# Patient Record
Sex: Male | Born: 1935 | Race: White | Hispanic: No | Marital: Married | Smoking: Former smoker
Health system: Southern US, Community
[De-identification: ages and names within clinical notes are randomized; demographics above are authoritative.]

## PROBLEM LIST (undated history)

## (undated) DIAGNOSIS — I509 Heart failure, unspecified: Secondary | ICD-10-CM

## (undated) DIAGNOSIS — E669 Obesity, unspecified: Secondary | ICD-10-CM

## (undated) DIAGNOSIS — I219 Acute myocardial infarction, unspecified: Secondary | ICD-10-CM

## (undated) DIAGNOSIS — I639 Cerebral infarction, unspecified: Secondary | ICD-10-CM

## (undated) DIAGNOSIS — E039 Hypothyroidism, unspecified: Secondary | ICD-10-CM

## (undated) DIAGNOSIS — N529 Male erectile dysfunction, unspecified: Secondary | ICD-10-CM

## (undated) DIAGNOSIS — Z8619 Personal history of other infectious and parasitic diseases: Secondary | ICD-10-CM

## (undated) DIAGNOSIS — I4891 Unspecified atrial fibrillation: Secondary | ICD-10-CM

## (undated) DIAGNOSIS — N400 Enlarged prostate without lower urinary tract symptoms: Secondary | ICD-10-CM

## (undated) DIAGNOSIS — R600 Localized edema: Secondary | ICD-10-CM

## (undated) DIAGNOSIS — E7800 Pure hypercholesterolemia, unspecified: Secondary | ICD-10-CM

## (undated) DIAGNOSIS — E079 Disorder of thyroid, unspecified: Secondary | ICD-10-CM

## (undated) DIAGNOSIS — I2510 Atherosclerotic heart disease of native coronary artery without angina pectoris: Secondary | ICD-10-CM

## (undated) DIAGNOSIS — M109 Gout, unspecified: Secondary | ICD-10-CM

## (undated) DIAGNOSIS — I499 Cardiac arrhythmia, unspecified: Secondary | ICD-10-CM

## (undated) DIAGNOSIS — K7460 Unspecified cirrhosis of liver: Secondary | ICD-10-CM

## (undated) HISTORY — PX: CARDIAC SURGERY: SHX584

## (undated) HISTORY — PX: VEIN BYPASS SURGERY: SHX833

## (undated) HISTORY — PX: VASCULAR SURGERY: SHX849

## (undated) HISTORY — PX: CORONARY ARTERY BYPASS GRAFT: SHX141

---

## 2005-08-15 ENCOUNTER — Ambulatory Visit: Payer: Self-pay | Admitting: Nephrology

## 2005-10-27 ENCOUNTER — Other Ambulatory Visit: Payer: Self-pay

## 2005-10-27 ENCOUNTER — Inpatient Hospital Stay: Payer: Self-pay | Admitting: Internal Medicine

## 2005-12-06 ENCOUNTER — Encounter: Payer: Self-pay | Admitting: Cardiology

## 2006-01-06 ENCOUNTER — Encounter: Payer: Self-pay | Admitting: Cardiology

## 2006-02-05 ENCOUNTER — Encounter: Payer: Self-pay | Admitting: Cardiology

## 2006-03-16 ENCOUNTER — Ambulatory Visit: Payer: Self-pay | Admitting: Cardiology

## 2006-05-06 ENCOUNTER — Other Ambulatory Visit: Payer: Self-pay

## 2006-05-06 ENCOUNTER — Inpatient Hospital Stay: Payer: Self-pay | Admitting: General Surgery

## 2006-06-01 ENCOUNTER — Other Ambulatory Visit: Payer: Self-pay

## 2006-06-01 ENCOUNTER — Emergency Department: Payer: Self-pay | Admitting: Internal Medicine

## 2006-06-20 ENCOUNTER — Ambulatory Visit: Payer: Self-pay | Admitting: Gastroenterology

## 2006-07-17 ENCOUNTER — Ambulatory Visit: Payer: Self-pay | Admitting: Gastroenterology

## 2007-03-12 ENCOUNTER — Ambulatory Visit: Payer: Self-pay | Admitting: Internal Medicine

## 2007-11-04 ENCOUNTER — Ambulatory Visit: Payer: Self-pay | Admitting: Nephrology

## 2011-04-19 ENCOUNTER — Encounter: Payer: Self-pay | Admitting: Nurse Practitioner

## 2011-04-19 ENCOUNTER — Encounter: Payer: Self-pay | Admitting: Cardiothoracic Surgery

## 2011-05-09 ENCOUNTER — Encounter: Payer: Self-pay | Admitting: Nurse Practitioner

## 2011-05-09 ENCOUNTER — Encounter: Payer: Self-pay | Admitting: Cardiothoracic Surgery

## 2011-06-09 ENCOUNTER — Encounter: Payer: Self-pay | Admitting: Nurse Practitioner

## 2011-06-09 ENCOUNTER — Encounter: Payer: Self-pay | Admitting: Cardiothoracic Surgery

## 2011-07-07 ENCOUNTER — Encounter: Payer: Self-pay | Admitting: Nurse Practitioner

## 2011-07-07 ENCOUNTER — Encounter: Payer: Self-pay | Admitting: Cardiothoracic Surgery

## 2011-07-11 ENCOUNTER — Other Ambulatory Visit: Payer: Self-pay | Admitting: Nurse Practitioner

## 2011-07-11 LAB — CBC WITH DIFFERENTIAL/PLATELET
Basophil %: 0.4 %
Eosinophil %: 2.7 %
HCT: 43.2 % (ref 40.0–52.0)
HGB: 14.3 g/dL (ref 13.0–18.0)
Lymphocyte #: 1.5 10*3/uL (ref 1.0–3.6)
Lymphocyte %: 29.8 %
MCHC: 33 g/dL (ref 32.0–36.0)
MCV: 103 fL — ABNORMAL HIGH (ref 80–100)
Monocyte %: 14.8 %
Neutrophil #: 2.6 10*3/uL (ref 1.4–6.5)
Platelet: 87 10*3/uL — ABNORMAL LOW (ref 150–440)
RBC: 4.19 10*6/uL — ABNORMAL LOW (ref 4.40–5.90)
RDW: 16.5 % — ABNORMAL HIGH (ref 11.5–14.5)
WBC: 4.9 10*3/uL (ref 3.8–10.6)

## 2011-07-11 LAB — ALBUMIN: Albumin: 3.9 g/dL (ref 3.4–5.0)

## 2011-07-11 LAB — SEDIMENTATION RATE: Erythrocyte Sed Rate: 14 mm/hr (ref 0–20)

## 2011-07-15 LAB — WOUND CULTURE

## 2011-07-25 ENCOUNTER — Ambulatory Visit: Payer: Self-pay | Admitting: Internal Medicine

## 2011-07-25 LAB — APTT: Activated PTT: 41.5 secs — ABNORMAL HIGH (ref 23.6–35.9)

## 2011-07-25 LAB — CBC CANCER CENTER
Eosinophil #: 0.1 x10 3/mm (ref 0.0–0.7)
Eosinophil %: 1.8 %
Eosinophil: 3 %
HCT: 44.7 % (ref 40.0–52.0)
HGB: 15 g/dL (ref 13.0–18.0)
Lymphocyte #: 1.3 x10 3/mm (ref 1.0–3.6)
MCHC: 33.4 g/dL (ref 32.0–36.0)
MCV: 102 fL — ABNORMAL HIGH (ref 80–100)
Monocyte #: 0.6 x10 3/mm (ref 0.0–0.7)
Monocyte %: 9.9 %
Monocytes: 8 %
Neutrophil #: 3.9 x10 3/mm (ref 1.4–6.5)
Neutrophil %: 65.8 %
RBC: 4.38 10*6/uL — ABNORMAL LOW (ref 4.40–5.90)
Segmented Neutrophils: 69 %
WBC: 6 x10 3/mm (ref 3.8–10.6)

## 2011-07-25 LAB — RETICULOCYTES
Absolute Retic Count: 0.085 10*6/uL — ABNORMAL HIGH (ref 0.024–0.084)
Reticulocyte: 2 % — ABNORMAL HIGH (ref 0.5–1.5)

## 2011-07-25 LAB — FIBRINOGEN: Fibrinogen: 447 mg/dL (ref 210–470)

## 2011-07-25 LAB — PROTIME-INR
INR: 1.6
Prothrombin Time: 19.5 secs — ABNORMAL HIGH (ref 11.5–14.7)

## 2011-07-25 LAB — IRON AND TIBC
Iron Bind.Cap.(Total): 418 ug/dL (ref 250–450)
Iron Saturation: 19 %

## 2011-07-25 LAB — FIBRIN DEGRADATION PROD.(ARMC ONLY): Fibrin Degradation Prod.: 0 — ABNORMAL LOW (ref 2.1–7.7)

## 2011-07-25 LAB — LACTATE DEHYDROGENASE: LDH: 137 U/L (ref 87–241)

## 2011-07-26 ENCOUNTER — Ambulatory Visit: Payer: Self-pay | Admitting: Internal Medicine

## 2011-07-26 LAB — PROT IMMUNOELECTROPHORES(ARMC)

## 2011-08-07 ENCOUNTER — Encounter: Payer: Self-pay | Admitting: Nurse Practitioner

## 2011-08-07 ENCOUNTER — Ambulatory Visit: Payer: Self-pay | Admitting: Internal Medicine

## 2011-08-07 ENCOUNTER — Encounter: Payer: Self-pay | Admitting: Cardiothoracic Surgery

## 2011-09-06 ENCOUNTER — Ambulatory Visit: Payer: Self-pay | Admitting: Internal Medicine

## 2011-09-06 ENCOUNTER — Encounter: Payer: Self-pay | Admitting: Cardiothoracic Surgery

## 2011-09-06 ENCOUNTER — Encounter: Payer: Self-pay | Admitting: Nurse Practitioner

## 2011-10-07 ENCOUNTER — Encounter: Payer: Self-pay | Admitting: Cardiothoracic Surgery

## 2011-10-07 ENCOUNTER — Encounter: Payer: Self-pay | Admitting: Nurse Practitioner

## 2011-11-06 ENCOUNTER — Encounter: Payer: Self-pay | Admitting: Nurse Practitioner

## 2011-11-06 ENCOUNTER — Encounter: Payer: Self-pay | Admitting: Cardiothoracic Surgery

## 2011-12-07 ENCOUNTER — Encounter: Payer: Self-pay | Admitting: Cardiothoracic Surgery

## 2011-12-07 ENCOUNTER — Encounter: Payer: Self-pay | Admitting: Nurse Practitioner

## 2012-01-22 ENCOUNTER — Ambulatory Visit: Payer: Self-pay | Admitting: Internal Medicine

## 2012-01-22 LAB — CBC CANCER CENTER
HCT: 44.7 % (ref 40.0–52.0)
Lymphocyte #: 1.6 x10 3/mm (ref 1.0–3.6)
MCH: 33.6 pg (ref 26.0–34.0)
MCHC: 32.5 g/dL (ref 32.0–36.0)
MCV: 103 fL — ABNORMAL HIGH (ref 80–100)
Monocyte #: 0.6 x10 3/mm (ref 0.2–1.0)
Monocyte %: 11.6 %
Neutrophil #: 3 x10 3/mm (ref 1.4–6.5)
Neutrophil %: 55.2 %
Platelet: 101 x10 3/mm — ABNORMAL LOW (ref 150–440)
RDW: 16 % — ABNORMAL HIGH (ref 11.5–14.5)
WBC: 5.4 x10 3/mm (ref 3.8–10.6)

## 2012-01-22 LAB — CREATININE, SERUM
Creatinine: 2.92 mg/dL — ABNORMAL HIGH (ref 0.60–1.30)
EGFR (African American): 23 — ABNORMAL LOW

## 2012-01-22 LAB — RETICULOCYTES
Absolute Retic Count: 0.0604 10*6/uL (ref 0.031–0.129)
Reticulocyte: 1.4 % (ref 0.7–2.5)

## 2012-02-06 ENCOUNTER — Ambulatory Visit: Payer: Self-pay | Admitting: Internal Medicine

## 2013-01-20 ENCOUNTER — Ambulatory Visit: Payer: Self-pay | Admitting: Internal Medicine

## 2013-02-05 ENCOUNTER — Ambulatory Visit: Payer: Self-pay | Admitting: Internal Medicine

## 2014-07-10 ENCOUNTER — Emergency Department: Payer: Self-pay | Admitting: Emergency Medicine

## 2014-07-23 ENCOUNTER — Ambulatory Visit: Payer: Self-pay | Admitting: Cardiology

## 2015-01-10 ENCOUNTER — Encounter: Payer: Self-pay | Admitting: Medical Oncology

## 2015-01-10 ENCOUNTER — Inpatient Hospital Stay
Admission: EM | Admit: 2015-01-10 | Discharge: 2015-01-12 | DRG: 372 | Disposition: A | Discharge status: Home / Self Care | Payer: Medicare Other | Attending: Internal Medicine | Admitting: Internal Medicine

## 2015-01-10 ENCOUNTER — Inpatient Hospital Stay: Payer: Medicare Other

## 2015-01-10 DIAGNOSIS — I482 Chronic atrial fibrillation: Secondary | ICD-10-CM | POA: Diagnosis present

## 2015-01-10 DIAGNOSIS — I214 Non-ST elevation (NSTEMI) myocardial infarction: Secondary | ICD-10-CM

## 2015-01-10 DIAGNOSIS — E860 Dehydration: Secondary | ICD-10-CM | POA: Diagnosis present

## 2015-01-10 DIAGNOSIS — Z87891 Personal history of nicotine dependence: Secondary | ICD-10-CM

## 2015-01-10 DIAGNOSIS — R197 Diarrhea, unspecified: Secondary | ICD-10-CM

## 2015-01-10 DIAGNOSIS — I509 Heart failure, unspecified: Secondary | ICD-10-CM | POA: Diagnosis present

## 2015-01-10 DIAGNOSIS — Z9889 Other specified postprocedural states: Secondary | ICD-10-CM | POA: Diagnosis not present

## 2015-01-10 DIAGNOSIS — E780 Pure hypercholesterolemia: Secondary | ICD-10-CM | POA: Diagnosis not present

## 2015-01-10 DIAGNOSIS — N179 Acute kidney failure, unspecified: Secondary | ICD-10-CM | POA: Diagnosis not present

## 2015-01-10 DIAGNOSIS — I2510 Atherosclerotic heart disease of native coronary artery without angina pectoris: Secondary | ICD-10-CM | POA: Diagnosis not present

## 2015-01-10 DIAGNOSIS — R748 Abnormal levels of other serum enzymes: Secondary | ICD-10-CM | POA: Diagnosis present

## 2015-01-10 DIAGNOSIS — J312 Chronic pharyngitis: Secondary | ICD-10-CM | POA: Diagnosis present

## 2015-01-10 DIAGNOSIS — E785 Hyperlipidemia, unspecified: Secondary | ICD-10-CM | POA: Diagnosis present

## 2015-01-10 DIAGNOSIS — Z7901 Long term (current) use of anticoagulants: Secondary | ICD-10-CM

## 2015-01-10 DIAGNOSIS — E876 Hypokalemia: Secondary | ICD-10-CM | POA: Diagnosis present

## 2015-01-10 DIAGNOSIS — R109 Unspecified abdominal pain: Secondary | ICD-10-CM

## 2015-01-10 DIAGNOSIS — Z951 Presence of aortocoronary bypass graft: Secondary | ICD-10-CM

## 2015-01-10 DIAGNOSIS — A047 Enterocolitis due to Clostridium difficile: Principal | ICD-10-CM | POA: Diagnosis present

## 2015-01-10 DIAGNOSIS — N189 Chronic kidney disease, unspecified: Secondary | ICD-10-CM

## 2015-01-10 DIAGNOSIS — N183 Chronic kidney disease, stage 3 (moderate): Secondary | ICD-10-CM | POA: Diagnosis not present

## 2015-01-10 DIAGNOSIS — R5381 Other malaise: Secondary | ICD-10-CM

## 2015-01-10 HISTORY — DX: Benign prostatic hyperplasia without lower urinary tract symptoms: N40.0

## 2015-01-10 HISTORY — DX: Unspecified atrial fibrillation: I48.91

## 2015-01-10 HISTORY — DX: Pure hypercholesterolemia, unspecified: E78.00

## 2015-01-10 HISTORY — DX: Disorder of thyroid, unspecified: E07.9

## 2015-01-10 HISTORY — DX: Heart failure, unspecified: I50.9

## 2015-01-10 LAB — BASIC METABOLIC PANEL
ANION GAP: 10 (ref 5–15)
BUN: 62 mg/dL — ABNORMAL HIGH (ref 6–20)
CHLORIDE: 99 mmol/L — AB (ref 101–111)
CO2: 25 mmol/L (ref 22–32)
Calcium: 8.6 mg/dL — ABNORMAL LOW (ref 8.9–10.3)
Creatinine, Ser: 2.99 mg/dL — ABNORMAL HIGH (ref 0.61–1.24)
GFR calc non Af Amer: 18 mL/min — ABNORMAL LOW (ref >60)
GFR, EST AFRICAN AMERICAN: 21 mL/min — AB (ref >60)
GLUCOSE: 98 mg/dL (ref 65–99)
POTASSIUM: 3 mmol/L — AB (ref 3.5–5.1)
Sodium: 134 mmol/L — ABNORMAL LOW (ref 135–145)

## 2015-01-10 LAB — HEPATIC FUNCTION PANEL
ALBUMIN: 2.3 g/dL — AB (ref 3.5–5.0)
ALT: 31 U/L (ref 17–63)
AST: 53 U/L — AB (ref 15–41)
Alkaline Phosphatase: 83 U/L (ref 38–126)
BILIRUBIN DIRECT: 0.4 mg/dL (ref 0.1–0.5)
BILIRUBIN TOTAL: 1.3 mg/dL — AB (ref 0.3–1.2)
Indirect Bilirubin: 0.9 mg/dL (ref 0.3–0.9)
Total Protein: 5.4 g/dL — ABNORMAL LOW (ref 6.5–8.1)

## 2015-01-10 LAB — CBC
HEMATOCRIT: 37 % — AB (ref 40.0–52.0)
HEMOGLOBIN: 12.5 g/dL — AB (ref 13.0–18.0)
MCH: 34.7 pg — AB (ref 26.0–34.0)
MCHC: 33.9 g/dL (ref 32.0–36.0)
MCV: 102.4 fL — ABNORMAL HIGH (ref 80.0–100.0)
Platelets: 171 10*3/uL (ref 150–440)
RBC: 3.62 MIL/uL — AB (ref 4.40–5.90)
RDW: 16.4 % — ABNORMAL HIGH (ref 11.5–14.5)
WBC: 13.6 10*3/uL — ABNORMAL HIGH (ref 3.8–10.6)

## 2015-01-10 LAB — URINALYSIS COMPLETE WITH MICROSCOPIC (ARMC ONLY)
BACTERIA UA: NONE SEEN
BILIRUBIN URINE: NEGATIVE
Glucose, UA: NEGATIVE mg/dL
HGB URINE DIPSTICK: NEGATIVE
KETONES UR: NEGATIVE mg/dL
Leukocytes, UA: NEGATIVE
Nitrite: NEGATIVE
PH: 5 (ref 5.0–8.0)
PROTEIN: NEGATIVE mg/dL
Specific Gravity, Urine: 1.017 (ref 1.005–1.030)

## 2015-01-10 LAB — TROPONIN I
TROPONIN I: 0.29 ng/mL — AB (ref <0.031)
Troponin I: 0.03 ng/mL (ref <0.031)

## 2015-01-10 LAB — PROTIME-INR
INR: 4.1
Prothrombin Time: 39.7 seconds — ABNORMAL HIGH (ref 11.4–15.0)

## 2015-01-10 LAB — LIPASE, BLOOD: LIPASE: 16 U/L — AB (ref 22–51)

## 2015-01-10 LAB — APTT: APTT: 61 s — AB (ref 24–36)

## 2015-01-10 LAB — GLUCOSE, CAPILLARY: GLUCOSE-CAPILLARY: 99 mg/dL (ref 65–99)

## 2015-01-10 MED ORDER — SIMVASTATIN 40 MG PO TABS
40.0000 mg | ORAL_TABLET | Freq: Every day | ORAL | Status: DC
  Administered 2015-01-10 – 2015-01-11 (×2): 40 mg via ORAL
  Filled 2015-01-10 (×2): qty 1

## 2015-01-10 MED ORDER — MORPHINE SULFATE (PF) 2 MG/ML IV SOLN: 2.0000 mg | INTRAVENOUS | Status: DC | PRN

## 2015-01-10 MED ORDER — HEPARIN SODIUM (PORCINE) 5000 UNIT/ML IJ SOLN: 5000.0000 [IU] | Freq: Three times a day (TID) | INTRAMUSCULAR | Status: DC

## 2015-01-10 MED ORDER — FUROSEMIDE 40 MG PO TABS
40.0000 mg | ORAL_TABLET | Freq: Every day | ORAL | Status: DC
  Administered 2015-01-11 – 2015-01-12 (×2): 40 mg via ORAL
  Filled 2015-01-10 (×2): qty 1

## 2015-01-10 MED ORDER — SODIUM CHLORIDE 0.9 % IJ SOLN
3.0000 mL | Freq: Two times a day (BID) | INTRAMUSCULAR | Status: DC
  Administered 2015-01-10 – 2015-01-11 (×2): 3 mL via INTRAVENOUS

## 2015-01-10 MED ORDER — POTASSIUM CHLORIDE IN NACL 40-0.9 MEQ/L-% IV SOLN
INTRAVENOUS | Status: DC
  Administered 2015-01-10 – 2015-01-11 (×2): 100 mL/h via INTRAVENOUS
  Filled 2015-01-10 (×7): qty 1000

## 2015-01-10 MED ORDER — LEVOTHYROXINE SODIUM 75 MCG PO TABS
150.0000 ug | ORAL_TABLET | Freq: Every day | ORAL | Status: DC
  Administered 2015-01-11 – 2015-01-12 (×2): 150 ug via ORAL
  Filled 2015-01-10 (×2): qty 2

## 2015-01-10 MED ORDER — ACETAMINOPHEN 325 MG PO TABS: 650.0000 mg | ORAL_TABLET | Freq: Four times a day (QID) | ORAL | Status: DC | PRN

## 2015-01-10 MED ORDER — ALLOPURINOL 300 MG PO TABS
150.0000 mg | ORAL_TABLET | Freq: Every day | ORAL | Status: DC
  Administered 2015-01-11 – 2015-01-12 (×2): 150 mg via ORAL
  Filled 2015-01-10 (×2): qty 1

## 2015-01-10 MED ORDER — ONDANSETRON HCL 4 MG PO TABS: 4.0000 mg | ORAL_TABLET | Freq: Four times a day (QID) | ORAL | Status: DC | PRN

## 2015-01-10 MED ORDER — CIPROFLOXACIN HCL 500 MG PO TABS
500.0000 mg | ORAL_TABLET | ORAL | Status: DC
  Administered 2015-01-11 (×2): 500 mg via ORAL
  Filled 2015-01-10 (×2): qty 1

## 2015-01-10 MED ORDER — METRONIDAZOLE 500 MG PO TABS
500.0000 mg | ORAL_TABLET | Freq: Three times a day (TID) | ORAL | Status: DC
  Administered 2015-01-11 – 2015-01-12 (×5): 500 mg via ORAL
  Filled 2015-01-10 (×5): qty 1

## 2015-01-10 MED ORDER — ASPIRIN EC 81 MG PO TBEC
81.0000 mg | DELAYED_RELEASE_TABLET | Freq: Every day | ORAL | Status: DC
  Administered 2015-01-11 – 2015-01-12 (×2): 81 mg via ORAL
  Filled 2015-01-10 (×2): qty 1

## 2015-01-10 MED ORDER — VITAMIN D 1000 UNITS PO TABS
1000.0000 [IU] | ORAL_TABLET | Freq: Every day | ORAL | Status: DC
  Administered 2015-01-11 – 2015-01-12 (×2): 1000 [IU] via ORAL
  Filled 2015-01-10 (×2): qty 1

## 2015-01-10 MED ORDER — POTASSIUM CHLORIDE CRYS ER 20 MEQ PO TBCR
40.0000 meq | EXTENDED_RELEASE_TABLET | Freq: Once | ORAL | Status: AC
  Administered 2015-01-10: 40 meq via ORAL
  Filled 2015-01-10: qty 2

## 2015-01-10 MED ORDER — PNEUMOCOCCAL VAC POLYVALENT 25 MCG/0.5ML IJ INJ
0.5000 mL | INJECTION | INTRAMUSCULAR | Status: AC
  Administered 2015-01-12: 0.5 mL via INTRAMUSCULAR
  Filled 2015-01-10: qty 0.5

## 2015-01-10 MED ORDER — AMIODARONE HCL 200 MG PO TABS
200.0000 mg | ORAL_TABLET | Freq: Every day | ORAL | Status: DC
  Administered 2015-01-11 – 2015-01-12 (×2): 200 mg via ORAL
  Filled 2015-01-10 (×2): qty 1

## 2015-01-10 MED ORDER — ONDANSETRON HCL 4 MG/2ML IJ SOLN: 4.0000 mg | Freq: Four times a day (QID) | INTRAMUSCULAR | Status: DC | PRN

## 2015-01-10 MED ORDER — INFLUENZA VAC SPLIT QUAD 0.5 ML IM SUSY
0.5000 mL | PREFILLED_SYRINGE | INTRAMUSCULAR | Status: AC
  Administered 2015-01-12: 0.5 mL via INTRAMUSCULAR
  Filled 2015-01-10: qty 0.5

## 2015-01-10 MED ORDER — ACETAMINOPHEN 650 MG RE SUPP: 650.0000 mg | Freq: Four times a day (QID) | RECTAL | Status: DC | PRN

## 2015-01-10 MED ORDER — POTASSIUM CHLORIDE 10 MEQ/100ML IV SOLN
10.0000 meq | INTRAVENOUS | Status: DC
  Administered 2015-01-10: 10 meq via INTRAVENOUS
  Filled 2015-01-10 (×6): qty 100

## 2015-01-10 NOTE — H&P (Signed)
History and Physical    Blake White ZOX:[id number] DOB: 1934/03/13 DOA: 01/10/2015  Referring physician: Dr. Fanny Bien PCP: Sula Rumple, MD  Specialists: Dr. Lady Gary  Chief Complaint: abdominal pain with diarrhea  HPI: Blake White is a 81 y.o. male has a past medical history significant for ASCVD s/p CABG and chronic A-fib on coumadin now with 3-4 day hx of lower abdominal discomfort and diarrhea. Feels extremely weak and fatigued. Denies CP or SOB. No N/V. In ER, pt noted to be dehydrated with hypokalemia with elevated troponin. Also with ARF due to dehydration. He is now admitted for further evaluation.  Review of Systems: The patient denies anorexia, fever, weight loss,, vision loss, decreased hearing, hoarseness, chest pain, syncope, dyspnea on exertion, peripheral edema, balance deficits, hemoptysis, melena, hematochezia, severe indigestion/heartburn, hematuria, incontinence, genital sores, muscle weakness, suspicious skin lesions, transient blindness, difficulty walking, depression, unusual weight change, abnormal bleeding, enlarged lymph nodes, angioedema, and breast masses.   Past Medical History  Diagnosis Date  . CHF (congestive heart failure)   . BPH (benign prostatic hyperplasia)   . A-fib   . Thyroid disease   . High cholesterol    Past Surgical History  Procedure Laterality Date  . Vein bypass surgery     Social History:  reports that he has quit smoking. He does not have any smokeless tobacco history on file. He reports that he does not drink alcohol. His drug history is not on file.  No Known Allergies  History reviewed. No pertinent family history.  Prior to Admission medications   Not on File   Physical Exam: Filed Vitals:   01/10/15 1749 01/10/15 1938 01/10/15 2000  BP: 104/50  103/59  Pulse: 64 61 62  Temp: 98.2 F (36.8 C)    TempSrc: Oral    Resp: Height:  (1.727 m)    Weight: 102.967 kg (227 lb)    SpO2: 99% 99% 99%      General:  No apparent distress  Eyes: PERRL, EOMI, no scleral icterus  ENT: moist oropharynx  Neck: supple, no lymphadenopathy  Cardiovascular: irregularly irregular without MRG; 2+ peripheral pulses, no JVD, 1+ peripheral edema  Respiratory: CTA biL, good air movement without wheezing, rhonchi or crackled  Abdomen: soft, mildly tender to palpation in the lower quadrants, positive bowel sounds, no guarding, no rebound  Skin: no rashes  Musculoskeletal: normal bulk and tone, no joint swelling  Psychiatric: normal mood and affect  Neurologic: CN 2-12 grossly intact, MS 5/5 in all 4  Labs on Admission:  Basic Metabolic Panel:  Recent Labs Lab 01/10/15 1834  NA 134*  K 3.0*  CL 99*  CO2 25  GLUCOSE 98  BUN 62*  CREATININE 2.99*  CALCIUM 8.6*   Liver Function Tests:  Recent Labs Lab 01/10/15 1834  AST 53*  ALT 31  ALKPHOS 83  BILITOT 1.3*  PROT 5.4*  ALBUMIN 2.3*    Recent Labs Lab 01/10/15 1834  LIPASE 16*   No results for input(s): AMMONIA in the last 168 hours. CBC:  Recent Labs Lab 01/10/15 1834  WBC 13.6*  HGB 12.5*  HCT 37.0*  MCV 102.4*  PLT 171   Cardiac Enzymes:  Recent Labs Lab 01/10/15 1834  TROPONINI 0.29*    BNP (last 3 results) No results for input(s): BNP in the last 8760 hours.  ProBNP (last 3 results) No results for input(s): PROBNP in the last 8760 hours.  CBG:  Recent Labs Lab 01/10/15  1826  GLUCAP 99    Radiological Exams on Admission: No results found.  EKG: Independently reviewed.  Assessment/Plan Active Problems:   Abdominal pain   Diarrhea   Will admit to floor with telemetry and follow enzymes. Send off stool studies and US abdomen. Begin empiric ABX. Consult GI and Cardiology. Begin IV fluids with K+. Repeat labs in AM.  Diet: clear liq Fluids: NS with K+ DVT Prophylaxis: SQ Heparin  Code Status: FULL  Family Communication: yes  Disposition Plan: home  Time spent: 50 min

## 2015-01-10 NOTE — ED Provider Notes (Signed)
[facility] Emergency Department Provider Note  ____________________________________________  Time seen: Approximately 7:34 PM  I have reviewed the triage vital signs and the nursing notes.   HISTORY  Chief Complaint Diarrhea and Fatigue    HPI Blake White is a 81 y.o. male with an extensive history of coronary artery disease status post CABG who takes warfarin who presents with several days of loose stools and general fatigue/malaise which as been getting worse.  He states that he is only having one or possibly 2 episodes of "diarrhea" daily, but they are quite loose.  He has tried taking Imodium but it does not help.  He has also had decreased appetite for the last several days and has not been eating well.  He feels generally weak all over.  He denies chest pain, shortness of breath.  He sometimes has some cramping lower abdominal pain but not currently.  He denies fever/chills.  He describes the diarrhea as severe even though it is typically only one episode per day.  Mostly he just feels generally ill and weak.     Past Medical History  Diagnosis Date  . CHF (congestive heart failure)   . BPH (benign prostatic hyperplasia)   . A-fib   . Thyroid disease   . High cholesterol     Patient Active Problem List   Diagnosis Date Noted  . Abdominal pain 01/10/2015  . Diarrhea 01/10/2015    Past Surgical History  Procedure Laterality Date  . Vein bypass surgery      No current outpatient prescriptions on file.  Allergies Review of patient's allergies indicates no known allergies.  History reviewed. No pertinent family history.  Social History Social History  Substance Use Topics  . Smoking status: Former Games developer  . Smokeless tobacco: None  . Alcohol Use: No    Review of Systems Constitutional: No fever/chills Eyes: No visual changes. ENT: No sore throat. Cardiovascular: Denies chest pain. Respiratory: Denies shortness of  breath. Gastrointestinal: Intermittent lower abdominal cramping.  No nausea, no vomiting.  One to 2 episodes of loose stool a day for several days.  No constipation. Genitourinary: Negative for dysuria. Musculoskeletal: Negative for back pain. Skin: Negative for rash. Neurological: Negative for headaches, focal weakness or numbness.  10-point ROS otherwise negative.  ____________________________________________   PHYSICAL EXAM:  VITAL SIGNS: ED Triage Vitals  Enc Vitals Group     BP 01/10/15 1749 104/50 mmHg     Pulse Rate 01/10/15 1749 64     Resp 01/10/15 1749 18     Temp 01/10/15 1749 98.2 F (36.8 C)     Temp Source 01/10/15 1749 Oral     SpO2 01/10/15 1749 99 %     Weight 01/10/15 1749 227 lb (102.967 kg)     Height 01/10/15 1749 5\' 8"  (1.727 m)     Head Cir --      Peak Flow --      Pain Score 01/10/15 1749 0     Pain Loc --      Pain Edu? --      Excl. in GC? --     Constitutional: Alert and oriented.  Appears ill but nontoxic Eyes: Conjunctivae are normal. PERRL. EOMI. Head: Atraumatic. Nose: No congestion/rhinnorhea. Mouth/Throat: Mucous membranes are moist.  Oropharynx non-erythematous. Neck: No stridor.   Cardiovascular: Borderline bradycardia, regular rhythm. Grossly normal heart sounds.  Good peripheral circulation. Respiratory: Normal respiratory effort.  No retractions. Lungs CTAB. Gastrointestinal: Soft and nontender. No distention. No  abdominal bruits. No CVA tenderness. Musculoskeletal: No lower extremity tenderness nor edema.  No joint effusions. Neurologic:  Normal speech and language. No gross focal neurologic deficits are appreciated.  Skin:  Skin is warm, dry and intact. No rash noted. Psychiatric: Mood and affect are normal. Speech and behavior are normal.  ____________________________________________   LABS (all labs ordered are listed, but only abnormal results are displayed)  Labs Reviewed  BASIC METABOLIC PANEL - Abnormal; Notable for  the following:    Sodium 134 (*)    Potassium 3.0 (*)    Chloride 99 (*)    BUN 62 (*)    Creatinine, Ser 2.99 (*)    Calcium 8.6 (*)    GFR calc non Af Amer 18 (*)    GFR calc Af Amer 21 (*)    All other components within normal limits  CBC - Abnormal; Notable for the following:    WBC 13.6 (*)    RBC 3.62 (*)    Hemoglobin 12.5 (*)    HCT 37.0 (*)    MCV 102.4 (*)    MCH 34.7 (*)    RDW 16.4 (*)    All other components within normal limits  URINALYSIS COMPLETEWITH MICROSCOPIC ([facility] ONLY) - Abnormal; Notable for the following:    Color, Urine YELLOW (*)    APPearance CLEAR (*)    Squamous Epithelial / LPF 0-5 (*)    All other components within normal limits  HEPATIC FUNCTION PANEL - Abnormal; Notable for the following:    Total Protein 5.4 (*)    Albumin 2.3 (*)    AST 53 (*)    Total Bilirubin 1.3 (*)    All other components within normal limits  TROPONIN I - Abnormal; Notable for the following:    Troponin I 0.29 (*)    All other components within normal limits  LIPASE, BLOOD - Abnormal; Notable for the following:    Lipase 16 (*)    All other components within normal limits  PROTIME-INR - Abnormal; Notable for the following:    Prothrombin Time 39.7 (*)    INR 4.10 (*)    All other components within normal limits  APTT - Abnormal; Notable for the following:    aPTT 61 (*)    All other components within normal limits  STOOL CULTURE  C DIFFICILE QUICK SCREEN W PCR REFLEX  GLUCOSE, CAPILLARY  TROPONIN I  TROPONIN I  TROPONIN I  CBC  COMPREHENSIVE METABOLIC PANEL  PROTIME-INR  CBG MONITORING, ED   ____________________________________________  EKG  ED ECG REPORT I, Consuelo Thayne, the attending physician, personally viewed and interpreted this ECG.   Date: 01/10/2015  EKG Time: 17:54  Rate: 62  Rhythm: normal sinus rhythm, 1st degree AV block  Axis: Normal  Intervals:first-degree A-V block   ST&T Change: Inverted T-wave in lead 3 and  aVF  ____________________________________________  RADIOLOGY   Not indicated  ____________________________________________   PROCEDURES  Procedure(s) performed: None  Critical Care performed: Yes, see critical care note(s)   CRITICAL CARE Performed by: Loleta Rose   Total critical care time: 30 minutes  Critical care time was exclusive of separately billable procedures and treating other patients.  Critical care was necessary to treat or prevent imminent or life-threatening deterioration.  Critical care was time spent personally by me on the following activities: development of treatment plan with patient and/or surrogate as well as nursing, discussions with consultants, evaluation of patient's response to treatment, examination of patient, obtaining history from  patient or surrogate, ordering and performing treatments and interventions, ordering and review of laboratory studies, ordering and review of radiographic studies, pulse oximetry and re-evaluation of patient's condition.  ____________________________________________   INITIAL IMPRESSION / ASSESSMENT AND PLAN / ED COURSE  Pertinent labs & imaging results that were available during my care of the patient were reviewed by me and considered in my medical decision making (see chart for details).  I was informed of the patient's troponin of 0.29 and went to see him immediately.  He is ill-appearing but nontoxic.  He has not had any chest pain or shortness of breath.  He also has absolutely no abdominal tenderness to palpation and no abdominal bruits or pulsatile masses.  The patient's INR is supratherapeutic at 4.1.  While his creatinine is elevated, it is actually approximately his baseline.  He does have hypokalemia which I am repleting with both by mouth and IV potassium.  I am concerned that his troponin is 0.29 is higher than anticipated for demand ischemia.  He does have very extensive coronary artery disease.   However, he is supratherapeutic at 4.1 and I am reluctant to give him either additional aspirin (he already takes a baby aspirin), or heparin.  I am admitting him for further management of his troponin, his hypokalemia, and possible NSTEMI and deferring the use of additional blood thinners/anti-platelet agents to the hospitalist.  ____________________________________________  FINAL CLINICAL IMPRESSION(S) / ED DIAGNOSES  Final diagnoses:  NSTEMI (non-ST elevated myocardial infarction)  Hypokalemia  Malaise  Diarrhea  Chronic renal failure, unspecified stage      NEW MEDICATIONS STARTED DURING THIS VISIT:  Current Discharge Medication List       Loleta Rose, MD 01/10/15 [phone]

## 2015-01-10 NOTE — ED Notes (Signed)
Pt to triage with reports that he has been having diarrhea x 3-4 days. Pt reports that he feels extremely weak. Pt also states that he has been having some swelling to the left side of his scrotum but denies difficulty urinating.

## 2015-01-10 NOTE — Progress Notes (Signed)
Patient arrived to unit via stretcher. Patient walked from stretcher to bed. Patient is alert and oriented. Family at bedside. General room orientation given. Fall signs signed and posted on wall.  Call bell and ascom system explained in detail to patient and family.   Skin assessment: Skin warm, thin and dry. Abrasion to LUE. Bruises to BUE. Circumferential brown discoloration to BLE (PVD). Thick toe nails. No other skin issues noted. Verifying RN, YS.

## 2015-01-11 ENCOUNTER — Encounter: Payer: Self-pay | Admitting: Gastroenterology

## 2015-01-11 ENCOUNTER — Inpatient Hospital Stay: Payer: Medicare Other

## 2015-01-11 LAB — TROPONIN I
TROPONIN I: 0.04 ng/mL — AB (ref <0.031)
Troponin I: 0.04 ng/mL — ABNORMAL HIGH (ref <0.031)
Troponin I: 0.8 ng/mL — ABNORMAL HIGH (ref <0.031)

## 2015-01-11 LAB — COMPREHENSIVE METABOLIC PANEL
ALT: 28 U/L (ref 17–63)
AST: 47 U/L — AB (ref 15–41)
Albumin: 2.2 g/dL — ABNORMAL LOW (ref 3.5–5.0)
Alkaline Phosphatase: 75 U/L (ref 38–126)
Anion gap: 6 (ref 5–15)
BILIRUBIN TOTAL: 1.6 mg/dL — AB (ref 0.3–1.2)
BUN: 60 mg/dL — AB (ref 6–20)
CO2: 25 mmol/L (ref 22–32)
CREATININE: 2.84 mg/dL — AB (ref 0.61–1.24)
Calcium: 8.3 mg/dL — ABNORMAL LOW (ref 8.9–10.3)
Chloride: 103 mmol/L (ref 101–111)
GFR calc Af Amer: 23 mL/min — ABNORMAL LOW (ref >60)
GFR, EST NON AFRICAN AMERICAN: 20 mL/min — AB (ref >60)
Glucose, Bld: 95 mg/dL (ref 65–99)
Potassium: 3.8 mmol/L (ref 3.5–5.1)
Sodium: 134 mmol/L — ABNORMAL LOW (ref 135–145)
TOTAL PROTEIN: 5 g/dL — AB (ref 6.5–8.1)

## 2015-01-11 LAB — C DIFFICILE QUICK SCREEN W PCR REFLEX
C DIFFICILE (CDIFF) TOXIN: POSITIVE — AB
C DIFFICLE (CDIFF) ANTIGEN: POSITIVE — AB
C Diff interpretation: POSITIVE

## 2015-01-11 LAB — CBC
HEMATOCRIT: 35.3 % — AB (ref 40.0–52.0)
Hemoglobin: 11.7 g/dL — ABNORMAL LOW (ref 13.0–18.0)
MCH: 34.1 pg — AB (ref 26.0–34.0)
MCHC: 33.3 g/dL (ref 32.0–36.0)
MCV: 102.4 fL — AB (ref 80.0–100.0)
Platelets: 165 10*3/uL (ref 150–440)
RBC: 3.44 MIL/uL — AB (ref 4.40–5.90)
RDW: 16.4 % — AB (ref 11.5–14.5)
WBC: 15.1 10*3/uL — AB (ref 3.8–10.6)

## 2015-01-11 LAB — PROTIME-INR
INR: 3.9
Prothrombin Time: 38.2 seconds — ABNORMAL HIGH (ref 11.4–15.0)

## 2015-01-11 NOTE — Consult Note (Signed)
[facility]  CARDIOLOGY CONSULT NOTE  [name] MRN: [id number] DOB/AGE: 1934-03-31 81 y.o.  Admit date: 01/10/2015 Referring Physician [facility] Primary Physician [facility] Primary Cardiologist Fath Reason for Consultation borderline elevated troponin  HPI: 81 year old gentleman with known history of coronary artery disease, status post CABG, chronic atrial fibrillation who presents with lower abdominal pain, diarrhea, and decreased by mouth intake with generalized fatigue and weakness. The emergency room the patient was noted be became anemic, secondary to dehydration,with elevated BUN and creatinine of 60 and 2.84, respectively. Upon was borderline elevated at 0.04 in the absence of chest pain or new ECG changes. Stool was positive for C. Difficile.  Review of systems complete and found to be negative unless listed above     Past Medical History  Diagnosis Date  . CHF (congestive heart failure)   . BPH (benign prostatic hyperplasia)   . A-fib   . Thyroid disease   . High cholesterol     Past Surgical History  Procedure Laterality Date  . Vein bypass surgery      Prescriptions prior to admission  Medication Sig Dispense Refill Last Dose  . allopurinol (ZYLOPRIM) 300 MG tablet Take 150 mg by mouth daily.  11 01/10/2015 at Unknown time  . amiodarone (PACERONE) 200 MG tablet Take 200 mg by mouth daily.  1 01/10/2015 at Unknown time  . aspirin EC 81 MG tablet Take 81 mg by mouth daily.   01/10/2015 at Unknown time  . Cholecalciferol (VITAMIN D3) 1000 UNITS CAPS Take 1,000 Units by mouth daily.   01/10/2015 at Unknown time  . furosemide (LASIX) 40 MG tablet Take 40 mg by mouth daily.  2 01/09/2015 at Unknown time  . hydrochlorothiazide (HYDRODIURIL) 25 MG tablet Take 25 mg by mouth daily.  1 01/09/2015 at Unknown time  . levothyroxine (SYNTHROID, LEVOTHROID) 150 MCG tablet Take 150 mcg by mouth daily before breakfast.  1 01/10/2015 at Unknown time  . metolazone (ZAROXOLYN) 5 MG tablet  Take 5 mg by mouth daily.   01/10/2015 at Unknown time  . potassium chloride SA (K-DUR,KLOR-CON) 20 MEQ tablet Take 20 mEq by mouth 2 (two) times daily.  6 01/10/2015 at Unknown time  . simvastatin (ZOCOR) 40 MG tablet Take 40 mg by mouth at bedtime.  1 01/09/2015 at Unknown time  . warfarin (COUMADIN) 3 MG tablet Take 3 mg by mouth every evening.  1 01/09/2015 at Unknown time   Social History   Social History  . Marital Status: Married    Spouse Name: [name]  . Number of Children: [name]  . Years of Education: [name]   Occupational History  . Not on file.   Social History Main Topics  . Smoking status: Former Games developer  . Smokeless tobacco: Not on file  . Alcohol Use: No  . Drug Use: Not on file  . Sexual Activity: Not on file   Other Topics Concern  . Not on file   Social History Narrative    History reviewed. No pertinent family history.    Review of systems complete and found to be negative unless listed above      PHYSICAL EXAM  General: Well developed, well nourished, in no acute distress HEENT:  Normocephalic and atramatic Neck:  No JVD.  Lungs: Clear bilaterally to auscultation and percussion. Heart: HRRR . Normal S1 and S2 without gallops or murmurs.  Abdomen: Bowel sounds are positive, abdomen soft and non-tender  Msk:  Back normal, normal gait. Normal strength and  tone for age. Extremities: No clubbing, cyanosis or edema.   Neuro: Alert and oriented X 3. Psych:  Good affect, responds appropriately  Labs:   Lab Results  Component Value Date   WBC 15.1* 01/11/2015   HGB 11.7* 01/11/2015   HCT 35.3* 01/11/2015   MCV 102.4* 01/11/2015   PLT 165 01/11/2015    Recent Labs Lab 01/11/15 0453  NA 134*  K 3.8  CL 103  CO2 25  BUN 60*  CREATININE 2.84*  CALCIUM 8.3*  PROT 5.0*  BILITOT 1.6*  ALKPHOS 75  ALT 28  AST 47*  GLUCOSE 95   Lab Results  Component Value Date   TROPONINI 0.80* 01/11/2015   No results found for: CHOL No results found for: HDL No  results found for: LDLCALC No results found for: TRIG No results found for: CHOLHDL No results found for: LDLDIRECT    Radiology: US Abdomen Complete  01/11/2015   CLINICAL DATA:  81 year old male with generalized abdominal pain for 1 week, with diarrhea.  EXAM: ULTRASOUND ABDOMEN COMPLETE  COMPARISON:  Abdominal ultrasound 07/26/2011.  FINDINGS: Gallbladder: Status post cholecystectomy.  Common bile duct: Diameter: 5.6 mm in the porta hepatis.  Liver: Nodular contour of the liver and heterogeneous echotexture throughout the hepatic parenchyma suggestive of underlying cirrhosis. No discrete cystic or solid hepatic lesions. Normal hepatopetal flow in the portal vein.  IVC: IVC appears dilated measuring up to 4.2 cm in diameter.  Pancreas: Visualized portion unremarkable.  Spleen: Size and appearance within normal limits.  6.4 cm in length.  Right Kidney: Length: 8.7 cm. Echogenicity within normal limits, however, cortex is thin and there is expansion of the central sinus echo complex. No mass or hydronephrosis visualized.  Left Kidney: Length: 9.8 cm. Echogenicity within normal limits, however, cortex is thin and there is expansion of the central sinus echo complex. Anechoic lesion with increased through transmission in the upper pole measuring 1.9 x 2.0 x 1.8 cm, compatible with a simple cyst. No hydronephrosis visualized.  Abdominal aorta: No aneurysm visualized. However, there is ectasia of the upper abdominal aorta which measures up to 2.9 cm in diameter.  Other findings: Small volume of ascites.  Right pleural effusion.  IMPRESSION: 1. No acute findings. 2. Status post cholecystectomy. 3. Morphologic changes in the liver suggestive of cirrhosis, as above. 4. Cortical atrophy in the kidneys bilaterally. 5. 1.9 x 2.0 x 1.8 cm simple cyst in the upper pole of the left kidney. 6. Small volume of ascites. 7. Right pleural effusion. 8. IVC appears distended, which could suggest elevated intravascular volumes.    Electronically Signed   By: Trudie Reed M.D.   On: 01/11/2015 08:46    EKG: normal sinus rhythm, normal ECG  ASSESSMENT AND PLAN:   1.Borderline elevated troponin, in the absence of chest pain or new ECG changes, in the setting of, pain, diarrhea, decreased by po intake with C. Difficile colitis,unlikely due to acute coronary syndrome 2. Known CAD, status post CABG, currently without angina  Recommendations  1. Agree with current therapy 2. Defer full dose anticoagulation 3. Defer cardiac catheterization 4. Gentle IV rehydration 5. No further cardiac diagnostics at this time  Signed: Ottilia Pippenger MD,PhD, [facility] 01/11/2015, 12:57 PM

## 2015-01-11 NOTE — Progress Notes (Signed)
[facility] - [place] at [facility]   PATIENT NAME: Blake White    MR#:  [id number]  DATE OF BIRTH:  1933/07/20  SUBJECTIVE:  CHIEF COMPLAINT:  Patient is still having diarrhea but at a lower frequency. Feeling bloated. Denies any chest pain or shortness of breath  REVIEW OF SYSTEMS:  CONSTITUTIONAL: No fever, fatigue or weakness.  EYES: No blurred or double vision.  EARS, NOSE, AND THROAT: No tinnitus or ear pain.  RESPIRATORY: No cough, shortness of breath, wheezing or hemoptysis.  CARDIOVASCULAR: No chest pain, orthopnea, edema.  GASTROINTESTINAL: No nausea, vomiting, reporting less diarrhea .generalized abdominal pain.  GENITOURINARY: No dysuria, hematuria.  ENDOCRINE: No polyuria, nocturia,  HEMATOLOGY: No anemia, easy bruising or bleeding SKIN: No rash or lesion. MUSCULOSKELETAL: No joint pain or arthritis.   NEUROLOGIC: No tingling, numbness, weakness.  PSYCHIATRY: No anxiety or depression.   DRUG ALLERGIES:  No Known Allergies  VITALS:  Blood pressure 103/56, pulse 71, temperature 99.2 F (37.3 C), temperature source Oral, resp. rate 17, height 5\' 8"  (1.727 m), weight 102.558 kg (226 lb 1.6 oz), SpO2 97 %.  PHYSICAL EXAMINATION:  GENERAL:  81 y.o.-year-old patient lying in the bed with no acute distress.  EYES: Pupils equal, round, reactive to light and accommodation. No scleral icterus. Extraocular muscles intact.  HEENT: Head atraumatic, normocephalic. Oropharynx and nasopharynx clear.  NECK:  Supple, no jugular venous distention. No thyroid enlargement, no tenderness.  LUNGS: Normal breath sounds bilaterally, no wheezing, rales,rhonchi or crepitation. No use of accessory muscles of respiration.  CARDIOVASCULAR: S1, S2 normal. No murmurs, rubs, or gallops.  ABDOMEN: Soft, nontender, distended. Bowel sounds present. No organomegaly or mass.  EXTREMITIES: No pedal edema, cyanosis, or clubbing.  NEUROLOGIC: Cranial nerves II through XII are  intact. Muscle strength 5/5 in all extremities. Sensation intact. Gait not checked.  PSYCHIATRIC: The patient is alert and oriented x 3.  SKIN: No obvious rash, lesion, or ulcer.    LABORATORY PANEL:   CBC  Recent Labs Lab 01/11/15 0453  WBC 15.1*  HGB 11.7*  HCT 35.3*  PLT 165   ------------------------------------------------------------------------------------------------------------------  Chemistries   Recent Labs Lab 01/11/15 0453  NA 134*  K 3.8  CL 103  CO2 25  GLUCOSE 95  BUN 60*  CREATININE 2.84*  CALCIUM 8.3*  AST 47*  ALT 28  ALKPHOS 75  BILITOT 1.6*   ------------------------------------------------------------------------------------------------------------------  Cardiac Enzymes  Recent Labs Lab 01/11/15 1130  TROPONINI 0.80*   ------------------------------------------------------------------------------------------------------------------  RADIOLOGY:  US Abdomen Complete  01/11/2015   CLINICAL DATA:  81 year old male with generalized abdominal pain for 1 week, with diarrhea.  EXAM: ULTRASOUND ABDOMEN COMPLETE  COMPARISON:  Abdominal ultrasound 07/26/2011.  FINDINGS: Gallbladder: Status post cholecystectomy.  Common bile duct: Diameter: 5.6 mm in the porta hepatis.  Liver: Nodular contour of the liver and heterogeneous echotexture throughout the hepatic parenchyma suggestive of underlying cirrhosis. No discrete cystic or solid hepatic lesions. Normal hepatopetal flow in the portal vein.  IVC: IVC appears dilated measuring up to 4.2 cm in diameter.  Pancreas: Visualized portion unremarkable.  Spleen: Size and appearance within normal limits.  6.4 cm in length.  Right Kidney: Length: 8.7 cm. Echogenicity within normal limits, however, cortex is thin and there is expansion of the central sinus echo complex. No mass or hydronephrosis visualized.  Left Kidney: Length: 9.8 cm. Echogenicity within normal limits, however, cortex is thin and there is expansion  of the central sinus echo complex. Anechoic lesion with increased through  transmission in the upper pole measuring 1.9 x 2.0 x 1.8 cm, compatible with a simple cyst. No hydronephrosis visualized.  Abdominal aorta: No aneurysm visualized. However, there is ectasia of the upper abdominal aorta which measures up to 2.9 cm in diameter.  Other findings: Small volume of ascites.  Right pleural effusion.  IMPRESSION: 1. No acute findings. 2. Status post cholecystectomy. 3. Morphologic changes in the liver suggestive of cirrhosis, as above. 4. Cortical atrophy in the kidneys bilaterally. 5. 1.9 x 2.0 x 1.8 cm simple cyst in the upper pole of the left kidney. 6. Small volume of ascites. 7. Right pleural effusion. 8. IVC appears distended, which could suggest elevated intravascular volumes.   Electronically Signed   By: Trudie Reed M.D.   On: 01/11/2015 08:46    EKG:   Orders placed or performed during the hospital encounter of 01/10/15  . ED EKG  . ED EKG  . EKG 12-Lead  . EKG 12-Lead    ASSESSMENT AND PLAN:   1.Acute colitis with generalized abdominal pain secondary to C. difficile colitis Stool test is positive for C. Difficile Patient will be given Flagyl 500 mg by mouth every 8 hours. GI consult is pending.  2. Acute kidney injury Creatinine at 2.814. Provided gentle hydration with the IV fluids Will avoid nephrotoxins  3. Elevated troponin -in the setting of acute kidney injury and C. difficile colitis Patient is asymptomatic Will repeat troponin Cardiology consult is placed to Dr. Lady Gary  4. Hyperlipidemia continue statin    All the records are reviewed and case discussed with Care Management/Social Workerr. Management plans discussed with the patient, family and they are in agreement.  CODE STATUS:   TOTAL TIME TAKING CARE OF THIS PATIENT: 40 minutes.   POSSIBLE D/C IN 1-2 DAYS, DEPENDING ON CLINICAL CONDITION.   Ramonita Lab M.D on 01/11/2015 at 12:27 PM  Between 7am to  6pm - Pager - [phone] After 6pm go to [email or web address] - password [email or web address] [facility]  [facility]  Office  [phone]  CC: Primary care physician; Sula Rumple, MD

## 2015-01-11 NOTE — Care Management Important Message (Signed)
Important Message  Patient Details  Name: Blake White MRN: [id number] Date of Birth: 26-Nov-1933   Medicare Important Message Given:  Yes-second notification given    Eber Hong, RN 01/11/2015, 8:19 AM

## 2015-01-11 NOTE — Consult Note (Signed)
GI Inpatient Consult Note  Reason for Consult:   Attending Requesting Consult:  History of Present Illness: Blake White is a 81 y.o. male with at least 1 week hx of diarrhea and low abdominal cramping. No nausea or fever. No recent travels or eating anything unusual. Took Abx 2 weeks ago for chronic sore throat. Has ENT evaluation soon. No prior hx of GI problems. No family hx of colon cancer/polyps or UC/Crohn's. No CP or SOB now but elevated troponin levels.  Past Medical History:  Past Medical History  Diagnosis Date  . CHF (congestive heart failure)   . BPH (benign prostatic hyperplasia)   . A-fib   . Thyroid disease   . High cholesterol     Problem List: Patient Active Problem List   Diagnosis Date Noted  . Abdominal pain 01/10/2015  . Diarrhea 01/10/2015    Past Surgical History: Past Surgical History  Procedure Laterality Date  . Vein bypass surgery      Allergies: No Known Allergies  Home Medications: Prescriptions prior to admission  Medication Sig Dispense Refill Last Dose  . allopurinol (ZYLOPRIM) 300 MG tablet Take 150 mg by mouth daily.  11 01/10/2015 at Unknown time  . amiodarone (PACERONE) 200 MG tablet Take 200 mg by mouth daily.  1 01/10/2015 at Unknown time  . aspirin EC 81 MG tablet Take 81 mg by mouth daily.   01/10/2015 at Unknown time  . Cholecalciferol (VITAMIN D3) 1000 UNITS CAPS Take 1,000 Units by mouth daily.   01/10/2015 at Unknown time  . furosemide (LASIX) 40 MG tablet Take 40 mg by mouth daily.  2 01/09/2015 at Unknown time  . hydrochlorothiazide (HYDRODIURIL) 25 MG tablet Take 25 mg by mouth daily.  1 01/09/2015 at Unknown time  . levothyroxine (SYNTHROID, LEVOTHROID) 150 MCG tablet Take 150 mcg by mouth daily before breakfast.  1 01/10/2015 at Unknown time  . metolazone (ZAROXOLYN) 5 MG tablet Take 5 mg by mouth daily.   01/10/2015 at Unknown time  . potassium chloride SA (K-DUR,KLOR-CON) 20 MEQ tablet Take 20 mEq by mouth 2 (two) times daily.  6  01/10/2015 at Unknown time  . simvastatin (ZOCOR) 40 MG tablet Take 40 mg by mouth at bedtime.  1 01/09/2015 at Unknown time  . warfarin (COUMADIN) 3 MG tablet Take 3 mg by mouth every evening.  1 01/09/2015 at Unknown time   Home medication reconciliation was completed with the patient.   Scheduled Inpatient Medications:   . allopurinol  150 mg Oral Daily  . amiodarone  200 mg Oral Daily  . aspirin EC  81 mg Oral Daily  . cholecalciferol  1,000 Units Oral Daily  . ciprofloxacin  500 mg Oral Q18H  . furosemide  40 mg Oral Daily  . heparin  5,000 Units Subcutaneous 3 times per day  . Influenza vac split quadrivalent PF  0.5 mL Intramuscular Tomorrow-1000  . levothyroxine  150 mcg Oral QAC breakfast  . metroNIDAZOLE  500 mg Oral 3 times per day  . pneumococcal 23 valent vaccine  0.5 mL Intramuscular Tomorrow-1000  . simvastatin  40 mg Oral QHS  . sodium chloride  3 mL Intravenous Q12H    Continuous Inpatient Infusions:   . 0.9 % NaCl with KCl 40 mEq / L 100 mL/hr (01/11/15 0650)    PRN Inpatient Medications:  acetaminophen **OR** acetaminophen, morphine injection, ondansetron **OR** ondansetron (ZOFRAN) IV  Family History: family history is not on file.  The patient's family history is negative  for inflammatory bowel disorders, GI malignancy, or solid organ transplantation.  Social History:   reports that he has quit smoking. He does not have any smokeless tobacco history on file. He reports that he does not drink alcohol. The patient denies ETOH, tobacco, or drug use.   Review of Systems: Constitutional: Weight is stable.  Eyes: No changes in vision. ENT: No oral lesions, sore throat.  GI: see HPI.  Heme/Lymph: No easy bruising.  CV: No chest pain.  GU: No hematuria.  Integumentary: No rashes.  Neuro: No headaches.  Psych: No depression/anxiety.  Endocrine: No heat/cold intolerance.  Allergic/Immunologic: No urticaria.  Resp: No cough, SOB.  Musculoskeletal: No joint  swelling.    Physical Examination: BP 108/54 mmHg  Pulse 72  Temp(Src) 99.9 F (37.7 C) (Oral)  Resp 20  Ht 5\' 8"  (1.727 m)  Wt 102.558 kg (226 lb 1.6 oz)  BMI 34.39 kg/m2  SpO2 99% Gen: NAD, alert and oriented x 4 HEENT: PEERLA, EOMI, Neck: supple, no JVD or thyromegaly Chest: CTA bilaterally, no wheezes, crackles, or other adventitious sounds CV: RRR, no m/g/c/r Abd: soft,mild abdominal tenderness, ND, +BS in all four quadrants; no HSM, guarding, ridigity, or rebound tenderness Ext: no edema, well perfused with 2+ pulses, Skin: no rash or lesions noted Lymph: no LAD  Data: Lab Results  Component Value Date   WBC 15.1* 01/11/2015   HGB 11.7* 01/11/2015   HCT 35.3* 01/11/2015   MCV 102.4* 01/11/2015   PLT 165 01/11/2015    Recent Labs Lab 01/10/15 1834 01/11/15 0453  HGB 12.5* 11.7*   Lab Results  Component Value Date   NA 134* 01/11/2015   K 3.8 01/11/2015   CL 103 01/11/2015   CO2 25 01/11/2015   BUN 60* 01/11/2015   CREATININE 2.84* 01/11/2015   Lab Results  Component Value Date   ALT 28 01/11/2015   AST 47* 01/11/2015   ALKPHOS 75 01/11/2015   BILITOT 1.6* 01/11/2015    Recent Labs Lab 01/09/15 2000 01/11/15 0453  APTT 61*  --   INR 4.10* 3.90   Assessment/Plan: Blake White is a 81 y.o. male  With diarrhea post Abx use. NSTEMI also. Positive for c.diff.  Recommendations: Flagyl PO x 7- 10 days. Will follow. Thank you for the consult. Please call with questions or concerns.  Marciana Uplinger, Ezzard Standing, MD

## 2015-01-11 NOTE — Progress Notes (Signed)
MD Gouru was notified of positive c diff

## 2015-01-11 NOTE — Care Management (Signed)
Presents form home after one week plus of diarrhea.  Stool is positive for C Diff.  Cardiology consult completed and no further cardiac diagnostics indicated.  Patient independent in all adls and driving prior to admitted.  PCP is Dr Elmer Ramp [facility].

## 2015-01-11 NOTE — Progress Notes (Signed)
MD Gouru was notified of trop 0.80.

## 2015-01-11 NOTE — Progress Notes (Signed)
Up to BR and tolerated it well. Iso for c diff. Room air. NSR. Takes meds ok. SCD on. GI consult completed. [place] abd completed. C diff positive. Pt has no further concerns at this time. Family educated on c diff.

## 2015-01-12 DIAGNOSIS — A047 Enterocolitis due to Clostridium difficile: Secondary | ICD-10-CM | POA: Diagnosis not present

## 2015-01-12 LAB — BASIC METABOLIC PANEL
ANION GAP: 7 (ref 5–15)
BUN: 59 mg/dL — ABNORMAL HIGH (ref 6–20)
CALCIUM: 8.2 mg/dL — AB (ref 8.9–10.3)
CO2: 23 mmol/L (ref 22–32)
Chloride: 104 mmol/L (ref 101–111)
Creatinine, Ser: 2.96 mg/dL — ABNORMAL HIGH (ref 0.61–1.24)
GFR, EST AFRICAN AMERICAN: 22 mL/min — AB (ref >60)
GFR, EST NON AFRICAN AMERICAN: 19 mL/min — AB (ref >60)
Glucose, Bld: 111 mg/dL — ABNORMAL HIGH (ref 65–99)
Potassium: 3.9 mmol/L (ref 3.5–5.1)
SODIUM: 134 mmol/L — AB (ref 135–145)

## 2015-01-12 MED ORDER — SACCHAROMYCES BOULARDII 250 MG PO CAPS
250.0000 mg | ORAL_CAPSULE | Freq: Two times a day (BID) | ORAL | Status: DC
  Administered 2015-01-12: 250 mg via ORAL
  Filled 2015-01-12: qty 1

## 2015-01-12 MED ORDER — SACCHAROMYCES BOULARDII 250 MG PO CAPS: 250.0000 mg | ORAL_CAPSULE | Freq: Two times a day (BID) | ORAL | Status: DC

## 2015-01-12 MED ORDER — WARFARIN SODIUM 3 MG PO TABS: 3.0000 mg | ORAL_TABLET | Freq: Every evening | ORAL | Status: DC

## 2015-01-12 MED ORDER — METRONIDAZOLE 500 MG PO TABS: 500.0000 mg | ORAL_TABLET | Freq: Three times a day (TID) | ORAL | Status: DC

## 2015-01-12 NOTE — Consult Note (Signed)
  GI Inpatient Follow-up Note  Patient Identification: Blake White is a 81 y.o. male  Subjective: Feeling better. Less abdominal cramping and diarrhea. Already eating solids now.  Scheduled Inpatient Medications:  . allopurinol  150 mg Oral Daily  . amiodarone  200 mg Oral Daily  . aspirin EC  81 mg Oral Daily  . cholecalciferol  1,000 Units Oral Daily  . ciprofloxacin  500 mg Oral Q18H  . furosemide  40 mg Oral Daily  . Influenza vac split quadrivalent PF  0.5 mL Intramuscular Tomorrow-1000  . levothyroxine  150 mcg Oral QAC breakfast  . metroNIDAZOLE  500 mg Oral 3 times per day  . pneumococcal 23 valent vaccine  0.5 mL Intramuscular Tomorrow-1000  . saccharomyces boulardii  250 mg Oral BID  . simvastatin  40 mg Oral QHS  . sodium chloride  3 mL Intravenous Q12H    Continuous Inpatient Infusions:   . 0.9 % NaCl with KCl 40 mEq / L 100 mL/hr (01/11/15 0650)    PRN Inpatient Medications:  acetaminophen **OR** acetaminophen, morphine injection, ondansetron **OR** ondansetron (ZOFRAN) IV  Review of Systems: Constitutional: Weight is stable.  Eyes: No changes in vision. ENT: No oral lesions, sore throat.  GI: see HPI.  Heme/Lymph: No easy bruising.  CV: No chest pain.  GU: No hematuria.  Integumentary: No rashes.  Neuro: No headaches.  Psych: No depression/anxiety.  Endocrine: No heat/cold intolerance.  Allergic/Immunologic: No urticaria.  Resp: No cough, SOB.  Musculoskeletal: No joint swelling.    Physical Examination: BP 108/53 mmHg  Pulse 70  Temp(Src) 98.7 F (37.1 C) (Oral)  Resp 18  Ht  (1.727 m)  Wt 106.686 kg (235 lb 3.2 oz)  BMI 35.77 kg/m2  SpO2 98% Gen: NAD, alert and oriented x 4 HEENT: PEERLA, EOMI, Neck: supple, no JVD or thyromegaly Chest: CTA bilaterally, no wheezes, crackles, or other adventitious sounds CV: RRR, no m/g/c/r Abd: soft, mild diffuse tenderness, ND, +BS in all four quadrants; no HSM, guarding, ridigity, or rebound  tenderness Ext: no edema, well perfused with 2+ pulses, Skin: no rash or lesions noted Lymph: no LAD  Data: Lab Results  Component Value Date   WBC 15.1* 01/11/2015   HGB 11.7* 01/11/2015   HCT 35.3* 01/11/2015   MCV 102.4* 01/11/2015   PLT 165 01/11/2015    Recent Labs Lab 01/10/15 1834 01/11/15 0453  HGB 12.5* 11.7*   Lab Results  Component Value Date   NA 134* 01/12/2015   K 3.9 01/12/2015   CL 104 01/12/2015   CO2 23 01/12/2015   BUN 59* 01/12/2015   CREATININE 2.96* 01/12/2015   Lab Results  Component Value Date   ALT 28 01/11/2015   AST 47* 01/11/2015   ALKPHOS 75 01/11/2015   BILITOT 1.6* 01/11/2015    Recent Labs Lab 01/09/15 2000 01/11/15 0453  APTT 61*  --   INR 4.10* 3.90   Assessment/Plan: Blake White is a 81 y.o. male with positive c.diff. Already improving.  Recommendations: Should be able to go home soon if continues to improve. Continue flagyl for 7-10 days. Will sign off. Thanks, Please call with questions or concerns.  Naw Lasala, Ezzard Standing, MD

## 2015-01-12 NOTE — Clinical Documentation Improvement (Addendum)
  Internal Medicine  #1 of 2: Can the diagnosis of acute renal failure be further specified?   Acute Tubular Necrosis  Acute Renal Cortical Necrosis  Acute Renal Medullary Necrosis  Other  Clinically Undetermined  #2 of 2:  CHF noted in Past Medical History grid.  Pt currently on furosemide  daily this admission.  Please specify the acuity (chronic, acute, acute on chronic) present this admission and type (diastolic, systolic, combined diastolic and systolic) if known, and add to active problems list / section of future notes.    Please exercise your independent, professional judgment when responding. A specific answer is not anticipated or expected.   Thank you, Doy Mince, RN [phone] Clinical Documentation Specialist

## 2015-01-12 NOTE — Discharge Summary (Addendum)
[facility] - [place] at [facility]   PATIENT NAME: Blake White    MR#:  [id number]  DATE OF BIRTH:  23-Aug-1933  DATE OF ADMISSION:  01/10/2015 ADMITTING PHYSICIAN: Adrian Saran, MD  DATE OF DISCHARGE: 01/12/2015  PRIMARY CARE PHYSICIAN: Sula Rumple, MD    ADMISSION DIAGNOSIS:  Diarrhea [R19.7] Hypokalemia [E87.6] Malaise [R53.81] NSTEMI (non-ST elevated myocardial infarction) [I21.4] Abdominal pain [R10.9] Chronic renal failure, unspecified stage [N18.9]  DISCHARGE DIAGNOSIS:  Clostridium difficile diarrhea CKD-III (baseline creatinine 2.9)  SECONDARY DIAGNOSIS:   Past Medical History  Diagnosis Date  . CHF (congestive heart failure)   . BPH (benign prostatic hyperplasia)   . A-fib   . Thyroid disease   . High cholesterol     HOSPITAL COURSE:  81 y.o. male has a past medical history significant for ASCVD s/p CABG and chronic A-fib on coumadin now with 3-4 day hx of lower abdominal discomfort and diarrhea. Feels extremely weak and fatigued  1.Acute colitis with generalized abdominal pain secondary to C. difficile colitis Stool test is positive for C. Difficile cont Flagyl 500 mg by mouth every 8 hours. For 10 days GI consult is appreciated with Dr Bluford Kaufmann -diarrhea is slowy improving. Lytes ok. advised yogurt in diet. Add probiotic Advance to soft diet  2. CKD-III/IV Creatinine at 2.814. Provided gentle hydration with the IV fluids Will avoid nephrotoxins -pt's baseline creat is 2.9 (2013) he follows with [facility] nephrology  3. Elevated troponin -in the setting of acute kidney injury and C. difficile colitis Patient is asymptomatic Cardiology consult with Dr Cassie Freer noted. No further diagnostics at present  4. Hyperlipidemia continue statin  5. Chronic afib on coumadin. INR 3.9 Pt will hold off coumadin for 2 days (sept 6th and 7th) and resume thereafter with INR check with his PCP  Will d/c later if cont to show improvement  CONSULTS  OBTAINED:    Wallace Cullens, MD Marcina Millard, MD  DRUG ALLERGIES:  No Known Allergies  DISCHARGE MEDICATIONS:   Current Discharge Medication List    START taking these medications   Details  metroNIDAZOLE (FLAGYL) 500 MG tablet Take 1 tablet (500 mg total) by mouth every 8 (eight) hours. Qty: 24 tablet, Refills: 0    saccharomyces boulardii (FLORASTOR) 250 MG capsule Take 1 capsule (250 mg total) by mouth 2 (two) times daily. Qty: 20 capsule, Refills: 0      CONTINUE these medications which have NOT CHANGED   Details  allopurinol (ZYLOPRIM) 300 MG tablet Take 150 mg by mouth daily. Refills: 11    amiodarone (PACERONE) 200 MG tablet Take 200 mg by mouth daily. Refills: 1    aspirin EC 81 MG tablet Take 81 mg by mouth daily.    Cholecalciferol (VITAMIN D3) 1000 UNITS CAPS Take 1,000 Units by mouth daily.    furosemide (LASIX) 40 MG tablet Take 40 mg by mouth daily. Refills: 2    hydrochlorothiazide (HYDRODIURIL) 25 MG tablet Take 25 mg by mouth daily. Refills: 1    levothyroxine (SYNTHROID, LEVOTHROID) 150 MCG tablet Take 150 mcg by mouth daily before breakfast. Refills: 1    metolazone (ZAROXOLYN) 5 MG tablet Take 5 mg by mouth daily.    potassium chloride SA (K-DUR,KLOR-CON) 20 MEQ tablet Take 20 mEq by mouth 2 (two) times daily. Refills: 6    simvastatin (ZOCOR) 40 MG tablet Take 40 mg by mouth at bedtime. Refills: 1    warfarin (COUMADIN) 3 MG tablet Take 3 mg by mouth every  evening. Refills: 1        If you experience worsening of your admission symptoms, develop shortness of breath, life threatening emergency, suicidal or homicidal thoughts you must seek medical attention immediately by calling 911 or calling your MD immediately  if symptoms less severe.  You Must read complete instructions/literature along with all the possible adverse reactions/side effects for all the Medicines you take and that have been prescribed to you. Take any new Medicines  after you have completely understood and accept all the possible adverse reactions/side effects.   Please note  You were cared for by a hospitalist during your hospital stay. If you have any questions about your discharge medications or the care you received while you were in the hospital after you are discharged, you can call the unit and asked to speak with the hospitalist on call if the hospitalist that took care of you is not available. Once you are discharged, your primary care physician will handle any further medical issues. Please note that NO REFILLS for any discharge medications will be authorized once you are discharged, as it is imperative that you return to your primary care physician (or establish a relationship with a primary care physician if you do not have one) for your aftercare needs so that they can reassess your need for medications and monitor your lab values. Today   SUBJECTIVE   weakness  VITAL SIGNS:  Blood pressure 114/58, pulse 66, temperature 98.7 F (37.1 C), temperature source Oral, resp. rate 18, height 5\' 8"  (1.727 m), weight 106.686 kg (235 lb 3.2 oz), SpO2 98 %.  I/O:    Intake/Output Summary (Last 24 hours) at 01/12/15 0846 Last data filed at 01/12/15 0446  Gross per 24 hour  Intake      0 ml  Output    850 ml  Net   -850 ml    PHYSICAL EXAMINATION:  GENERAL:  81 y.o.-year-old patient lying in the bed with no acute distress.  EYES: Pupils equal, round, reactive to light and accommodation. No scleral icterus. Extraocular muscles intact.  HEENT: Head atraumatic, normocephalic. Oropharynx and nasopharynx clear.  NECK:  Supple, no jugular venous distention. No thyroid enlargement, no tenderness.  LUNGS: Normal breath sounds bilaterally, no wheezing, rales,rhonchi or crepitation. No use of accessory muscles of respiration.  CARDIOVASCULAR: S1, S2 normal. No murmurs, rubs, or gallops.  ABDOMEN: Soft, non-tender, non-distended. Bowel sounds present. No  organomegaly or mass.  EXTREMITIES: No pedal edema, cyanosis, or clubbing.  NEUROLOGIC: Cranial nerves II through XII are intact. Muscle strength 5/5 in all extremities. Sensation intact. Gait not checked.  PSYCHIATRIC: The patient is alert and oriented x 3.  SKIN: No obvious rash, lesion, or ulcer.   DATA REVIEW:   CBC   Recent Labs Lab 01/11/15 0453  WBC 15.1*  HGB 11.7*  HCT 35.3*  PLT 165    Chemistries   Recent Labs Lab 01/11/15 0453 01/12/15 0321  NA 134* 134*  K 3.8 3.9  CL 103 104  CO2 25 23  GLUCOSE 95 111*  BUN 60* 59*  CREATININE 2.84* 2.96*  CALCIUM 8.3* 8.2*  AST 47*  --   ALT 28  --   ALKPHOS 75  --   BILITOT 1.6*  --     Microbiology Results   Recent Results (from the past 240 hour(s))  C difficile quick scan w PCR reflex     Status: Abnormal   Collection Time: 01/11/15  9:25 AM  Result Value Ref  Range Status   C Diff antigen POSITIVE (A) NEGATIVE Final   C Diff toxin POSITIVE (A) NEGATIVE Final   C Diff interpretation   Final    Positive for toxigenic C. difficile, active toxin production present.    Comment: DOLL FERGUSON AT 1032 ON 01/11/15 BY [facility] CRITICAL RESULT CALLED TO, READ BACK BY AND VERIFIED WITH:     RADIOLOGY:  US Abdomen Complete  01/11/2015   CLINICAL DATA:  81 year old male with generalized abdominal pain for 1 week, with diarrhea.  EXAM: ULTRASOUND ABDOMEN COMPLETE  COMPARISON:  Abdominal ultrasound 07/26/2011.  FINDINGS: Gallbladder: Status post cholecystectomy.  Common bile duct: Diameter: 5.6 mm in the porta hepatis.  Liver: Nodular contour of the liver and heterogeneous echotexture throughout the hepatic parenchyma suggestive of underlying cirrhosis. No discrete cystic or solid hepatic lesions. Normal hepatopetal flow in the portal vein.  IVC: IVC appears dilated measuring up to 4.2 cm in diameter.  Pancreas: Visualized portion unremarkable.  Spleen: Size and appearance within normal limits.  6.4 cm in length.  Right Kidney:  Length: 8.7 cm. Echogenicity within normal limits, however, cortex is thin and there is expansion of the central sinus echo complex. No mass or hydronephrosis visualized.  Left Kidney: Length: 9.8 cm. Echogenicity within normal limits, however, cortex is thin and there is expansion of the central sinus echo complex. Anechoic lesion with increased through transmission in the upper pole measuring 1.9 x 2.0 x 1.8 cm, compatible with a simple cyst. No hydronephrosis visualized.  Abdominal aorta: No aneurysm visualized. However, there is ectasia of the upper abdominal aorta which measures up to 2.9 cm in diameter.  Other findings: Small volume of ascites.  Right pleural effusion.  IMPRESSION: 1. No acute findings. 2. Status post cholecystectomy. 3. Morphologic changes in the liver suggestive of cirrhosis, as above. 4. Cortical atrophy in the kidneys bilaterally. 5. 1.9 x 2.0 x 1.8 cm simple cyst in the upper pole of the left kidney. 6. Small volume of ascites. 7. Right pleural effusion. 8. IVC appears distended, which could suggest elevated intravascular volumes.   Electronically Signed   By: Trudie Reed M.D.   On: 01/11/2015 08:46     Management plans discussed with the patient, family and they are in agreement.  CODE STATUS:     Code Status Orders        Start     Ordered   01/10/15 2226  Full code   Continuous     01/10/15 2226      TOTAL TIME TAKING CARE OF THIS PATIENT: 40 minutes.    Aurie Harroun M.D on 01/12/2015 at 8:46 AM  Between 7am to 6pm - Pager - [phone] After 6pm go to [email or web address] - password [email or web address] [facility]  [facility]  Office  [phone]  CC: Primary care physician; Sula Rumple, MD

## 2015-01-12 NOTE — Discharge Instructions (Signed)
Yogurt with your diet.  HOLD YOUR COUMADIN FOR SEPT 6th and 7th. Get your PT and INR levels checked on sept 8th at your PCP's office

## 2015-01-12 NOTE — Progress Notes (Signed)
Patient d/c'd home. Education provided, no questions at this time. Patient to be picked up by daughter and wife. Telemetry removed. Trudee Kuster

## 2015-01-12 NOTE — Care Management (Signed)
Patient for discharge today and no discharge needs.

## 2015-01-15 ENCOUNTER — Inpatient Hospital Stay
Admission: EM | Admit: 2015-01-15 | Discharge: 2015-01-20 | DRG: 371 | Disposition: A | Discharge status: Home Health | Payer: Medicare Other | Attending: Internal Medicine | Admitting: Internal Medicine

## 2015-01-15 DIAGNOSIS — Z7901 Long term (current) use of anticoagulants: Secondary | ICD-10-CM

## 2015-01-15 DIAGNOSIS — R188 Other ascites: Secondary | ICD-10-CM | POA: Diagnosis present

## 2015-01-15 DIAGNOSIS — I5023 Acute on chronic systolic (congestive) heart failure: Secondary | ICD-10-CM | POA: Diagnosis present

## 2015-01-15 DIAGNOSIS — K7460 Unspecified cirrhosis of liver: Secondary | ICD-10-CM | POA: Diagnosis present

## 2015-01-15 DIAGNOSIS — R6510 Systemic inflammatory response syndrome (SIRS) of non-infectious origin without acute organ dysfunction: Secondary | ICD-10-CM

## 2015-01-15 DIAGNOSIS — A047 Enterocolitis due to Clostridium difficile: Principal | ICD-10-CM | POA: Diagnosis present

## 2015-01-15 DIAGNOSIS — N189 Chronic kidney disease, unspecified: Secondary | ICD-10-CM

## 2015-01-15 DIAGNOSIS — E780 Pure hypercholesterolemia: Secondary | ICD-10-CM | POA: Diagnosis present

## 2015-01-15 DIAGNOSIS — R140 Abdominal distension (gaseous): Secondary | ICD-10-CM

## 2015-01-15 DIAGNOSIS — E860 Dehydration: Secondary | ICD-10-CM | POA: Diagnosis present

## 2015-01-15 DIAGNOSIS — Z95820 Peripheral vascular angioplasty status with implants and grafts: Secondary | ICD-10-CM

## 2015-01-15 DIAGNOSIS — I4891 Unspecified atrial fibrillation: Secondary | ICD-10-CM | POA: Diagnosis present

## 2015-01-15 DIAGNOSIS — N179 Acute kidney failure, unspecified: Secondary | ICD-10-CM | POA: Diagnosis present

## 2015-01-15 DIAGNOSIS — Z79899 Other long term (current) drug therapy: Secondary | ICD-10-CM

## 2015-01-15 DIAGNOSIS — D72829 Elevated white blood cell count, unspecified: Secondary | ICD-10-CM

## 2015-01-15 DIAGNOSIS — Z951 Presence of aortocoronary bypass graft: Secondary | ICD-10-CM

## 2015-01-15 DIAGNOSIS — Z87891 Personal history of nicotine dependence: Secondary | ICD-10-CM

## 2015-01-15 DIAGNOSIS — N508 Other specified disorders of male genital organs: Secondary | ICD-10-CM | POA: Diagnosis not present

## 2015-01-15 DIAGNOSIS — Z7982 Long term (current) use of aspirin: Secondary | ICD-10-CM

## 2015-01-15 DIAGNOSIS — A0472 Enterocolitis due to Clostridium difficile, not specified as recurrent: Secondary | ICD-10-CM

## 2015-01-15 DIAGNOSIS — I739 Peripheral vascular disease, unspecified: Secondary | ICD-10-CM | POA: Diagnosis present

## 2015-01-15 DIAGNOSIS — E876 Hypokalemia: Secondary | ICD-10-CM | POA: Diagnosis not present

## 2015-01-15 DIAGNOSIS — N400 Enlarged prostate without lower urinary tract symptoms: Secondary | ICD-10-CM | POA: Diagnosis present

## 2015-01-15 DIAGNOSIS — N184 Chronic kidney disease, stage 4 (severe): Secondary | ICD-10-CM | POA: Diagnosis present

## 2015-01-15 DIAGNOSIS — I130 Hypertensive heart and chronic kidney disease with heart failure and stage 1 through stage 4 chronic kidney disease, or unspecified chronic kidney disease: Secondary | ICD-10-CM | POA: Diagnosis present

## 2015-01-15 DIAGNOSIS — I2510 Atherosclerotic heart disease of native coronary artery without angina pectoris: Secondary | ICD-10-CM | POA: Diagnosis present

## 2015-01-15 DIAGNOSIS — E039 Hypothyroidism, unspecified: Secondary | ICD-10-CM | POA: Diagnosis present

## 2015-01-15 LAB — STOOL CULTURE

## 2015-01-15 MED ORDER — FUROSEMIDE 10 MG/ML IJ SOLN
40.0000 mg | Freq: Once | INTRAMUSCULAR | Status: AC
  Administered 2015-01-15: 40 mg via INTRAVENOUS
  Filled 2015-01-15: qty 4

## 2015-01-15 NOTE — ED Notes (Signed)
Pt to ED c/o fluid retention to bilateral lower extremities and groin. Pt presents with legs wrapped, denies any ShOB or chest pain.

## 2015-01-15 NOTE — ED Notes (Signed)
Pt states he was recently d/c'd from the hospital and was told if he is unable to urinate after lasix was given to come back to the ed. Pt states scrotum is also swollen.

## 2015-01-15 NOTE — ED Provider Notes (Signed)
[facility] Emergency Department Provider Note  ____________________________________________  Time seen: 2323  I have reviewed the triage vital signs and the nursing notes.   HISTORY  Chief Complaint Leg Swelling and Groin Swelling     HPI Blake White is a 81 y.o. male who was recently in the hospital returns to the emergency department tonight due to swelling in both legs.  The patient has had a problem with swelling through his recent hospitalization. He has leg wraps on currently to help reduce the peripheral edema. He reports the legs are more swollen now than they've been and the Lasix does not appear to be working. He also complains of scrotal swelling.   He or his family called his cardiologist, Dr. Lady Gary, who advised the patient take 2 tablets of Lasix this evening. He did this around 7 PM. The patient is urinating some, but he reports it is not as much as he would expect and he still has the swelling. Dr. Lady Gary advised the patient come to the emergency department if the Lasix was not working.  The patient was diagnosed with C. difficile this past Monday. He reports he has less diarrhea now than before. He has a history of renal insufficiency, which the nephrologist are saying is stable. He has a history of atrial fibrillation and cardiac issues. While in the hospital, he had isolated elevated troponin levels. He is not complaining of any chest pain, shortness of breath.    Past Medical History  Diagnosis Date  . CHF (congestive heart failure)   . BPH (benign prostatic hyperplasia)   . A-fib   . Thyroid disease   . High cholesterol     Patient Active Problem List   Diagnosis Date Noted  . Abdominal pain 01/10/2015  . Diarrhea 01/10/2015    Past Surgical History  Procedure Laterality Date  . Vein bypass surgery      Current Outpatient Rx  Name  Route  Sig  Dispense  Refill  . allopurinol (ZYLOPRIM) 300 MG tablet   Oral   Take 150 mg  by mouth daily.      11   . amiodarone (PACERONE) 200 MG tablet   Oral   Take 200 mg by mouth daily.      1   . aspirin EC 81 MG tablet   Oral   Take 81 mg by mouth daily.         . Cholecalciferol (VITAMIN D3) 1000 UNITS CAPS   Oral   Take 1,000 Units by mouth daily.         . furosemide (LASIX) 40 MG tablet   Oral   Take 40 mg by mouth daily.      2   . hydrochlorothiazide (HYDRODIURIL) 25 MG tablet   Oral   Take 25 mg by mouth daily.      1   . levothyroxine (SYNTHROID, LEVOTHROID) 150 MCG tablet   Oral   Take 150 mcg by mouth daily before breakfast.      1   . metolazone (ZAROXOLYN) 5 MG tablet   Oral   Take 5 mg by mouth daily.         . metroNIDAZOLE (FLAGYL) 500 MG tablet   Oral   Take 1 tablet (500 mg total) by mouth every 8 (eight) hours.   24 tablet   0   . potassium chloride SA (K-DUR,KLOR-CON) 20 MEQ tablet   Oral   Take 20 mEq by mouth 2 (two)  times daily.      6   . saccharomyces boulardii (FLORASTOR) 250 MG capsule   Oral   Take 1 capsule (250 mg total) by mouth 2 (two) times daily.   20 capsule   0   . simvastatin (ZOCOR) 40 MG tablet   Oral   Take 40 mg by mouth at bedtime.      1   . warfarin (COUMADIN) 3 MG tablet   Oral   Take 1 tablet (3 mg total) by mouth every evening.   30 tablet   1     Allergies Review of patient's allergies indicates no known allergies.  No family history on file.  Social History Social History  Substance Use Topics  . Smoking status: Former Games developer  . Smokeless tobacco: Not on file  . Alcohol Use: No    Review of Systems  Constitutional: Negative for fever. ENT: Negative for sore throat. Cardiovascular: Negative for chest pain. Respiratory: Negative for shortness of breath. Gastrointestinal: Recent diagnosis of C. difficile. Diarrhea has improved.. Genitourinary: Negative for dysuria. Musculoskeletal: No myalgias or injuries. Chief complaint of peripheral edema. See history  of present illness Skin: Negative for rash. Neurological: Negative for headaches   10-point ROS otherwise negative.  ____________________________________________   PHYSICAL EXAM:  VITAL SIGNS: ED Triage Vitals  Enc Vitals Group     BP 01/15/15 2240 106/58 mmHg     Pulse Rate 01/15/15 2240 70     Resp 01/15/15 2240 18     Temp 01/15/15 2240 98 F (36.7 C)     Temp Source 01/15/15 2240 Oral     SpO2 01/15/15 2240 99 %     Weight 01/15/15 2240 241 lb (109.317 kg)     Height 01/15/15 2240 5\' 8"  (1.727 m)     Head Cir --      Peak Flow --      Pain Score 01/15/15 2238 3     Pain Loc --      Pain Edu? --      Excl. in GC? --     Constitutional: Alert and oriented. Appears somewhat deconditioned and slightly uncomfortable but in no acute distress. ENT   Head: Normocephalic and atraumatic.   Nose: No congestion/rhinnorhea.   Mouth/Throat: Mucous membranes are moist. Cardiovascular: Irregular rhythm with a rate of 60-65, no murmur noted Respiratory:  Normal respiratory effort, no tachypnea.    Breath sounds are clear and equal bilaterally.  Gastrointestinal: Soft and nontender. No distention.  Gentle exam: Normal external male genitalia except for mild to moderate scrotal swelling. Back: No muscle spasm, no tenderness, no CVA tenderness. Musculoskeletal: No deformity noted. The patient has compression leg wraps on both legs. There is mild to moderate edema noted around his toes and thighs.  Neurologic:  Normal speech and language. No gross focal neurologic deficits are appreciated.  Skin:  Skin is warm, dry. No rash noted. Psychiatric: Mood and affect are normal. Speech and behavior are normal.  ____________________________________________    LABS (pertinent positives/negatives)  Labs Reviewed  CBC WITH DIFFERENTIAL/PLATELET - Abnormal; Notable for the following:    WBC 19.2 (*)    RBC 3.51 (*)    Hemoglobin 12.0 (*)    HCT 36.4 (*)    MCV 103.9 (*)    MCH  34.1 (*)    RDW 16.8 (*)    Neutro Abs 17.1 (*)    Lymphs Abs 0.5 (*)    All other components within normal limits  COMPREHENSIVE  METABOLIC PANEL - Abnormal; Notable for the following:    Sodium 134 (*)    Glucose, Bld 119 (*)    BUN 69 (*)    Creatinine, Ser 3.97 (*)    Calcium 8.6 (*)    Total Protein 5.1 (*)    Albumin 2.2 (*)    AST 52 (*)    GFR calc non Af Amer 13 (*)    GFR calc Af Amer 15 (*)    All other components within normal limits  CULTURE, BLOOD (ROUTINE X 2)  CULTURE, BLOOD (ROUTINE X 2)     ____________________________________________   EKG  ED ECG REPORT I, Aaden Buckman W, the attending physician, personally viewed and interpreted this ECG.   Date: 01/15/2015  EKG Time: 2238  Rate: 69  Rhythm: Sinus rhythm with first-degree block  Axis: Left axis deviation at -41  Intervals: PR interval of 304.  ST&T Change: None noted   ____________________________________________ ____________________________________________   INITIAL IMPRESSION / ASSESSMENT AND PLAN / ED COURSE  Pertinent labs & imaging results that were available during my care of the patient were reviewed by me and considered in my medical decision making (see chart for details).  Complicated situation with a 81 year old male with renal dysfunction and peripheral edema requiring diuresis. He took 2 Lasix earlier tonight without much affect. We are rechecking blood tests now and we will treat him with Lasix, 40 mg IV.  ----------------------------------------- 1:32 AM on 01/16/2015 -----------------------------------------  Mr. Wheeling renal function has slightly worsened with his creatinine up nearly 1 and his BUN up nearly 10. He has not diuresis much with the IV Lasix. Of concern, he has a significant elevated white blood cell count at 19,000.  With this constellation of leukocytosis, peripheral edema, and worsening renal function, we will move to admit him to the hospital. This is  been discussed with Dr. Clint Guy of the hospitalist service.  ____________________________________________   FINAL CLINICAL IMPRESSION(S) / ED DIAGNOSES  Final diagnoses:  Acute on chronic renal failure  Leukocytosis  Clostridium difficile diarrhea  SIRS (systemic inflammatory response syndrome)      Darien Ramus, MD 01/16/15 [phone]

## 2015-01-16 ENCOUNTER — Emergency Department: Payer: Medicare Other

## 2015-01-16 ENCOUNTER — Encounter: Payer: Self-pay | Admitting: Emergency Medicine

## 2015-01-16 DIAGNOSIS — E039 Hypothyroidism, unspecified: Secondary | ICD-10-CM | POA: Diagnosis present

## 2015-01-16 DIAGNOSIS — R188 Other ascites: Secondary | ICD-10-CM | POA: Diagnosis present

## 2015-01-16 DIAGNOSIS — N400 Enlarged prostate without lower urinary tract symptoms: Secondary | ICD-10-CM | POA: Diagnosis present

## 2015-01-16 DIAGNOSIS — Z87891 Personal history of nicotine dependence: Secondary | ICD-10-CM | POA: Diagnosis not present

## 2015-01-16 DIAGNOSIS — I2510 Atherosclerotic heart disease of native coronary artery without angina pectoris: Secondary | ICD-10-CM | POA: Diagnosis present

## 2015-01-16 DIAGNOSIS — N179 Acute kidney failure, unspecified: Secondary | ICD-10-CM | POA: Diagnosis present

## 2015-01-16 DIAGNOSIS — I130 Hypertensive heart and chronic kidney disease with heart failure and stage 1 through stage 4 chronic kidney disease, or unspecified chronic kidney disease: Secondary | ICD-10-CM | POA: Diagnosis present

## 2015-01-16 DIAGNOSIS — K7460 Unspecified cirrhosis of liver: Secondary | ICD-10-CM | POA: Diagnosis present

## 2015-01-16 DIAGNOSIS — Z79899 Other long term (current) drug therapy: Secondary | ICD-10-CM | POA: Diagnosis not present

## 2015-01-16 DIAGNOSIS — Z95820 Peripheral vascular angioplasty status with implants and grafts: Secondary | ICD-10-CM | POA: Diagnosis not present

## 2015-01-16 DIAGNOSIS — Z7901 Long term (current) use of anticoagulants: Secondary | ICD-10-CM | POA: Diagnosis not present

## 2015-01-16 DIAGNOSIS — Z951 Presence of aortocoronary bypass graft: Secondary | ICD-10-CM | POA: Diagnosis not present

## 2015-01-16 DIAGNOSIS — A047 Enterocolitis due to Clostridium difficile: Secondary | ICD-10-CM | POA: Diagnosis present

## 2015-01-16 DIAGNOSIS — N184 Chronic kidney disease, stage 4 (severe): Secondary | ICD-10-CM | POA: Diagnosis present

## 2015-01-16 DIAGNOSIS — E876 Hypokalemia: Secondary | ICD-10-CM | POA: Diagnosis not present

## 2015-01-16 DIAGNOSIS — I739 Peripheral vascular disease, unspecified: Secondary | ICD-10-CM | POA: Diagnosis present

## 2015-01-16 DIAGNOSIS — E860 Dehydration: Secondary | ICD-10-CM | POA: Diagnosis present

## 2015-01-16 DIAGNOSIS — N508 Other specified disorders of male genital organs: Secondary | ICD-10-CM | POA: Diagnosis not present

## 2015-01-16 DIAGNOSIS — E780 Pure hypercholesterolemia: Secondary | ICD-10-CM | POA: Diagnosis present

## 2015-01-16 DIAGNOSIS — I509 Heart failure, unspecified: Secondary | ICD-10-CM | POA: Diagnosis not present

## 2015-01-16 DIAGNOSIS — I5023 Acute on chronic systolic (congestive) heart failure: Secondary | ICD-10-CM | POA: Diagnosis present

## 2015-01-16 DIAGNOSIS — I4891 Unspecified atrial fibrillation: Secondary | ICD-10-CM | POA: Diagnosis present

## 2015-01-16 DIAGNOSIS — Z7982 Long term (current) use of aspirin: Secondary | ICD-10-CM | POA: Diagnosis not present

## 2015-01-16 LAB — COMPREHENSIVE METABOLIC PANEL
ALBUMIN: 2.2 g/dL — AB (ref 3.5–5.0)
ALK PHOS: 107 U/L (ref 38–126)
ALT: 21 U/L (ref 17–63)
AST: 52 U/L — AB (ref 15–41)
Anion gap: 7 (ref 5–15)
BILIRUBIN TOTAL: 1 mg/dL (ref 0.3–1.2)
BUN: 69 mg/dL — AB (ref 6–20)
CO2: 24 mmol/L (ref 22–32)
CREATININE: 3.97 mg/dL — AB (ref 0.61–1.24)
Calcium: 8.6 mg/dL — ABNORMAL LOW (ref 8.9–10.3)
Chloride: 103 mmol/L (ref 101–111)
GFR calc Af Amer: 15 mL/min — ABNORMAL LOW (ref >60)
GFR calc non Af Amer: 13 mL/min — ABNORMAL LOW (ref >60)
GLUCOSE: 119 mg/dL — AB (ref 65–99)
POTASSIUM: 3.6 mmol/L (ref 3.5–5.1)
Sodium: 134 mmol/L — ABNORMAL LOW (ref 135–145)
TOTAL PROTEIN: 5.1 g/dL — AB (ref 6.5–8.1)

## 2015-01-16 LAB — CBC WITH DIFFERENTIAL/PLATELET
BASOS ABS: 0.1 10*3/uL (ref 0–0.1)
BASOS PCT: 0 %
Eosinophils Absolute: 0.6 10*3/uL (ref 0–0.7)
Eosinophils Relative: 3 %
HEMATOCRIT: 36.4 % — AB (ref 40.0–52.0)
HEMOGLOBIN: 12 g/dL — AB (ref 13.0–18.0)
LYMPHS PCT: 3 %
Lymphs Abs: 0.5 10*3/uL — ABNORMAL LOW (ref 1.0–3.6)
MCH: 34.1 pg — ABNORMAL HIGH (ref 26.0–34.0)
MCHC: 32.8 g/dL (ref 32.0–36.0)
MCV: 103.9 fL — AB (ref 80.0–100.0)
Monocytes Absolute: 0.9 10*3/uL (ref 0.2–1.0)
Monocytes Relative: 5 %
NEUTROS ABS: 17.1 10*3/uL — AB (ref 1.4–6.5)
NEUTROS PCT: 89 %
Platelets: 217 10*3/uL (ref 150–440)
RBC: 3.51 MIL/uL — AB (ref 4.40–5.90)
RDW: 16.8 % — ABNORMAL HIGH (ref 11.5–14.5)
WBC: 19.2 10*3/uL — ABNORMAL HIGH (ref 3.8–10.6)

## 2015-01-16 LAB — URINALYSIS COMPLETE WITH MICROSCOPIC (ARMC ONLY)
BACTERIA UA: NONE SEEN
BILIRUBIN URINE: NEGATIVE
GLUCOSE, UA: NEGATIVE mg/dL
HGB URINE DIPSTICK: NEGATIVE
Ketones, ur: NEGATIVE mg/dL
Leukocytes, UA: NEGATIVE
NITRITE: NEGATIVE
Protein, ur: NEGATIVE mg/dL
Specific Gravity, Urine: 1.005 (ref 1.005–1.030)
pH: 5 (ref 5.0–8.0)

## 2015-01-16 LAB — PROTEIN / CREATININE RATIO, URINE
Creatinine, Urine: 29 mg/dL
PROTEIN CREATININE RATIO: 0.24 mg/mg{creat} — AB (ref 0.00–0.15)
TOTAL PROTEIN, URINE: 7 mg/dL

## 2015-01-16 LAB — PROTIME-INR
INR: 2.32
Prothrombin Time: 25.6 seconds — ABNORMAL HIGH (ref 11.4–15.0)

## 2015-01-16 LAB — HEMOGLOBIN A1C: Hgb A1c MFr Bld: 5.4 % (ref 4.0–6.0)

## 2015-01-16 LAB — TSH: TSH: 2.181 u[IU]/mL (ref 0.350–4.500)

## 2015-01-16 MED ORDER — ALLOPURINOL 100 MG PO TABS
150.0000 mg | ORAL_TABLET | Freq: Every day | ORAL | Status: DC
  Administered 2015-01-16 – 2015-01-20 (×5): 150 mg via ORAL
  Filled 2015-01-16 (×5): qty 2

## 2015-01-16 MED ORDER — SODIUM CHLORIDE 0.9 % IV SOLN
INTRAVENOUS | Status: DC
  Administered 2015-01-16: 04:00:00 via INTRAVENOUS

## 2015-01-16 MED ORDER — POTASSIUM CHLORIDE CRYS ER 20 MEQ PO TBCR
20.0000 meq | EXTENDED_RELEASE_TABLET | Freq: Two times a day (BID) | ORAL | Status: DC
Start: 2015-01-16 — End: 2015-01-20
  Administered 2015-01-16 – 2015-01-20 (×9): 20 meq via ORAL
  Filled 2015-01-16 (×9): qty 1

## 2015-01-16 MED ORDER — MORPHINE SULFATE (PF) 2 MG/ML IV SOLN: 2.0000 mg | INTRAVENOUS | Status: DC | PRN

## 2015-01-16 MED ORDER — ONDANSETRON HCL 4 MG PO TABS: 4.0000 mg | ORAL_TABLET | Freq: Four times a day (QID) | ORAL | Status: DC | PRN

## 2015-01-16 MED ORDER — SODIUM CHLORIDE 0.9 % IJ SOLN
3.0000 mL | Freq: Two times a day (BID) | INTRAMUSCULAR | Status: DC
Start: 2015-01-16 — End: 2015-01-20
  Administered 2015-01-16 – 2015-01-20 (×8): 3 mL via INTRAVENOUS

## 2015-01-16 MED ORDER — AMIODARONE HCL 200 MG PO TABS
200.0000 mg | ORAL_TABLET | Freq: Every day | ORAL | Status: DC
  Administered 2015-01-16 – 2015-01-20 (×5): 200 mg via ORAL
  Filled 2015-01-16 (×5): qty 1

## 2015-01-16 MED ORDER — VANCOMYCIN 50 MG/ML ORAL SOLUTION
250.0000 mg | Freq: Four times a day (QID) | ORAL | Status: DC
  Administered 2015-01-16 – 2015-01-20 (×18): 250 mg via ORAL
  Filled 2015-01-16 (×21): qty 5

## 2015-01-16 MED ORDER — METRONIDAZOLE 500 MG PO TABS
500.0000 mg | ORAL_TABLET | Freq: Three times a day (TID) | ORAL | Status: DC
  Administered 2015-01-16 – 2015-01-17 (×5): 500 mg via ORAL
  Filled 2015-01-16 (×5): qty 1

## 2015-01-16 MED ORDER — VITAMIN D 1000 UNITS PO TABS
1000.0000 [IU] | ORAL_TABLET | Freq: Every day | ORAL | Status: DC
Start: 2015-01-16 — End: 2015-01-20
  Administered 2015-01-16 – 2015-01-20 (×5): 1000 [IU] via ORAL
  Filled 2015-01-16 (×5): qty 1

## 2015-01-16 MED ORDER — WARFARIN SODIUM 3 MG PO TABS
3.0000 mg | ORAL_TABLET | Freq: Every evening | ORAL | Status: DC
Start: 2015-01-16 — End: 2015-01-20
  Administered 2015-01-16 – 2015-01-19 (×4): 3 mg via ORAL
  Filled 2015-01-16 (×4): qty 1

## 2015-01-16 MED ORDER — METOLAZONE 5 MG PO TABS
5.0000 mg | ORAL_TABLET | Freq: Every day | ORAL | Status: DC
  Administered 2015-01-16: 5 mg via ORAL
  Filled 2015-01-16: qty 1

## 2015-01-16 MED ORDER — HEPARIN SODIUM (PORCINE) 5000 UNIT/ML IJ SOLN: 5000.0000 [IU] | Freq: Three times a day (TID) | INTRAMUSCULAR | Status: DC

## 2015-01-16 MED ORDER — HYDROCHLOROTHIAZIDE 25 MG PO TABS
25.0000 mg | ORAL_TABLET | Freq: Every day | ORAL | Status: DC
  Filled 2015-01-16: qty 1

## 2015-01-16 MED ORDER — LEVOTHYROXINE SODIUM 75 MCG PO TABS
150.0000 ug | ORAL_TABLET | Freq: Every day | ORAL | Status: DC
  Administered 2015-01-16 – 2015-01-20 (×5): 150 ug via ORAL
  Filled 2015-01-16 (×5): qty 2

## 2015-01-16 MED ORDER — FUROSEMIDE 10 MG/ML IJ SOLN
40.0000 mg | Freq: Two times a day (BID) | INTRAMUSCULAR | Status: DC
  Administered 2015-01-16 – 2015-01-20 (×9): 40 mg via INTRAVENOUS
  Filled 2015-01-16 (×10): qty 4

## 2015-01-16 MED ORDER — ONDANSETRON HCL 4 MG/2ML IJ SOLN: 4.0000 mg | Freq: Four times a day (QID) | INTRAMUSCULAR | Status: DC | PRN

## 2015-01-16 MED ORDER — FUROSEMIDE 40 MG PO TABS
40.0000 mg | ORAL_TABLET | Freq: Every day | ORAL | Status: DC
  Filled 2015-01-16: qty 1

## 2015-01-16 MED ORDER — ACETAMINOPHEN 325 MG PO TABS: 650.0000 mg | ORAL_TABLET | Freq: Four times a day (QID) | ORAL | Status: DC | PRN

## 2015-01-16 MED ORDER — ASPIRIN EC 81 MG PO TBEC
81.0000 mg | DELAYED_RELEASE_TABLET | Freq: Every day | ORAL | Status: DC
  Administered 2015-01-16 – 2015-01-20 (×5): 81 mg via ORAL
  Filled 2015-01-16 (×5): qty 1

## 2015-01-16 MED ORDER — SIMVASTATIN 40 MG PO TABS
40.0000 mg | ORAL_TABLET | Freq: Every day | ORAL | Status: DC
Start: 2015-01-16 — End: 2015-01-20
  Administered 2015-01-16 – 2015-01-19 (×4): 40 mg via ORAL
  Filled 2015-01-16 (×4): qty 1

## 2015-01-16 MED ORDER — ACETAMINOPHEN 650 MG RE SUPP: 650.0000 mg | Freq: Four times a day (QID) | RECTAL | Status: DC | PRN

## 2015-01-16 NOTE — Consult Note (Signed)
[facility]  CONSULT NOTE    Date: 01/16/2015                  Patient Name:  Blake White  MRN: [id number]  DOB: Sep 22, 1933  Age / Sex: 81 y.o., male         PCP: Sherrin Daisy, MD                 Service Requesting Consult: Dr. Doy Hutching                 Reason for Consult: Acute on chronic renal failure            History of Present Illness: Blake White is a 81 y.o. white male with congestive heart failure, atrial fibrillation, hypothyroidism, BPH, hyperlipidemia, hypertension, coronary artery disease status post CABG, who was admitted to [facility] on 01/15/2015 for Leukocytosis [D72.829] Clostridium difficile diarrhea [A04.7] Acute on chronic renal failure [N17.9, N18.9] SIRS (systemic inflammatory response syndrome) [A41.9]  Patient was originally admitted to [facility] from 9/4-9/6 for acute infective colitis with C. Diff. Sent home on metronidazole however returns with worsening diarrhea and elevated white count. On previous admission, discharged with creatinine of 2.96 on 9/6. Admitted with creatinine of 3.97, Nephrology consulted  Patient currently on furosemide, metolazone and hydrochlorothiazide. However continues to increasing peripheral edema. He complains of scrotal edema as well.   Patient follows with [facility] and states his baseline GFR is 20.    Medications: Outpatient medications: Prescriptions prior to admission  Medication Sig Dispense Refill Last Dose  . allopurinol (ZYLOPRIM) 300 MG tablet Take 150 mg by mouth daily.  11 01/15/2015 at Unknown time  . amiodarone (PACERONE) 200 MG tablet Take 200 mg by mouth daily.  1 01/15/2015 at Unknown time  . aspirin EC 81 MG tablet Take 81 mg by mouth daily.   01/15/2015 at Unknown time  . Cholecalciferol (VITAMIN D3) 1000 UNITS CAPS Take 1,000 Units by mouth daily.   01/15/2015 at Unknown time  . furosemide (LASIX) 40 MG tablet Take 40 mg by mouth daily.  2 01/15/2015 at Unknown time  .  hydrochlorothiazide (HYDRODIURIL) 25 MG tablet Take 25 mg by mouth daily.  1 01/15/2015 at Unknown time  . levothyroxine (SYNTHROID, LEVOTHROID) 150 MCG tablet Take 150 mcg by mouth daily before breakfast.  1 01/15/2015 at Unknown time  . metolazone (ZAROXOLYN) 5 MG tablet Take 5 mg by mouth daily.   01/15/2015 at Unknown time  . metroNIDAZOLE (FLAGYL) 500 MG tablet Take 1 tablet (500 mg total) by mouth every 8 (eight) hours. 24 tablet 0 01/15/2015 at 1630  . potassium chloride SA (K-DUR,KLOR-CON) 20 MEQ tablet Take 20 mEq by mouth 2 (two) times daily.  6 01/15/2015 at Unknown time  . saccharomyces boulardii (FLORASTOR) 250 MG capsule Take 1 capsule (250 mg total) by mouth 2 (two) times daily. 20 capsule 0 01/15/2015 at Unknown time  . simvastatin (ZOCOR) 40 MG tablet Take 40 mg by mouth at bedtime.  1 01/14/2015 at Unknown time  . warfarin (COUMADIN) 3 MG tablet Take 1 tablet (3 mg total) by mouth every evening. 30 tablet 1 01/14/2015 at Unknown time    Current medications: Current Facility-Administered Medications  Medication Dose Route Frequency Provider Last Rate Last Dose  . acetaminophen (TYLENOL) tablet 650 mg  650 mg Oral Q6H PRN Harrie Foreman, MD       Or  . acetaminophen (TYLENOL) suppository 650 mg  650 mg  Rectal Q6H PRN Harrie Foreman, MD      . allopurinol (ZYLOPRIM) tablet 150 mg  150 mg Oral Daily Harrie Foreman, MD   150 mg at 01/16/15 0929  . amiodarone (PACERONE) tablet 200 mg  200 mg Oral Daily Harrie Foreman, MD   200 mg at 01/16/15 0931  . aspirin EC tablet 81 mg  81 mg Oral Daily Harrie Foreman, MD   81 mg at 01/16/15 0930  . cholecalciferol (VITAMIN D) tablet 1,000 Units  1,000 Units Oral Daily Harrie Foreman, MD   1,000 Units at 01/16/15 [phone]  . levothyroxine (SYNTHROID, LEVOTHROID) tablet 150 mcg  150 mcg Oral QAC breakfast Harrie Foreman, MD   150 mcg at 01/16/15 0929  . metroNIDAZOLE (FLAGYL) tablet 500 mg  500 mg Oral 3 times per day Harrie Foreman, MD   500  mg at 01/16/15 0602  . morphine 2 MG/ML injection 2 mg  2 mg Intravenous Q4H PRN Harrie Foreman, MD      . ondansetron [facility]) tablet 4 mg  4 mg Oral Q6H PRN Harrie Foreman, MD       Or  . ondansetron [facility]) injection 4 mg  4 mg Intravenous Q6H PRN Harrie Foreman, MD      . potassium chloride SA (K-DUR,KLOR-CON) CR tablet 20 mEq  20 mEq Oral BID Harrie Foreman, MD   20 mEq at 01/16/15 0931  . simvastatin (ZOCOR) tablet 40 mg  40 mg Oral QHS Harrie Foreman, MD      . sodium chloride 0.9 % injection 3 mL  3 mL Intravenous Q12H Harrie Foreman, MD   3 mL at 01/16/15 0931  . vancomycin (VANCOCIN) 50 mg/mL oral solution 250 mg  250 mg Oral 4 times per day Harrie Foreman, MD   250 mg at 01/16/15 0602  . warfarin (COUMADIN) tablet 3 mg  3 mg Oral QPM Harrie Foreman, MD          Allergies: No Known Allergies    Past Medical History: Past Medical History  Diagnosis Date  . CHF (congestive heart failure)   . BPH (benign prostatic hyperplasia)   . A-fib   . Thyroid disease   . High cholesterol      Past Surgical History: Past Surgical History  Procedure Laterality Date  . Vein bypass surgery    . Vascular surgery    . Cardiac surgery      quad bypass     Family History: Family History  Problem Relation Age of Onset  . Diabetes Mellitus II Mother   . Lung cancer Brother      Social History: Social History   Social History  . Marital Status: Married    Spouse Name: [name]  . Number of Children: [name]  . Years of Education: [name]   Occupational History  . Not on file.   Social History Main Topics  . Smoking status: Former Research scientist (life sciences)  . Smokeless tobacco: Not on file  . Alcohol Use: No  . Drug Use: Not on file  . Sexual Activity: Not on file   Other Topics Concern  . Not on file   Social History Narrative     Review of Systems: Review of Systems  Constitutional: Positive for fever, chills, malaise/fatigue and diaphoresis. Negative for weight  loss.  HENT: Negative.  Negative for congestion, ear discharge, ear pain, hearing loss, nosebleeds, sore throat and tinnitus.   Eyes: Negative.  Negative for blurred vision, double vision, photophobia, pain, discharge and redness.  Respiratory: Positive for shortness of breath. Negative for cough, hemoptysis, sputum production, wheezing and stridor.   Cardiovascular: Positive for orthopnea, leg swelling and PND. Negative for chest pain, palpitations and claudication.  Gastrointestinal: Positive for diarrhea. Negative for heartburn, nausea, vomiting, abdominal pain, constipation, blood in stool and melena.  Genitourinary: Positive for frequency. Negative for dysuria, urgency, hematuria and flank pain.  Musculoskeletal: Negative.  Negative for myalgias, back pain, joint pain, falls and neck pain.  Skin: Negative for itching and rash.  Neurological: Positive for weakness. Negative for dizziness, tingling, tremors, sensory change, speech change, focal weakness, seizures, loss of consciousness and headaches.  Endo/Heme/Allergies: Negative for environmental allergies and polydipsia. Does not bruise/bleed easily.  Psychiatric/Behavioral: Negative for depression, suicidal ideas, hallucinations, memory loss and substance abuse. The patient is not nervous/anxious and does not have insomnia.     Vital Signs: Blood pressure 98/55, pulse 65, temperature 98.1 F (36.7 C), temperature source Oral, resp. rate 20, height 5' 8"  (1.727 m), weight 109.77 kg (242 lb), SpO2 98 %.  Weight trends: Filed Weights   01/15/15 2240 01/16/15 0353  Weight: 109.317 kg (241 lb) 109.77 kg (242 lb)    Physical Exam: General: NAD, morbidly obese white male  Head: Normocephalic, atraumatic. Moist oral mucosal membranes  Eyes: Anicteric, PERRL  Neck: Supple, trachea midline  Lungs:  Bibasilar crackles  Heart: bradycardia  Abdomen:  Soft, nontender, +abdominal wall edema  Extremities: ++ peripheral edema.  Neurologic:  Nonfocal, moving all four extremities  Skin: No lesions  GU +scrotal edema     Lab results: Basic Metabolic Panel:  Recent Labs Lab 01/11/15 0453 01/12/15 0321 01/15/15 2359  NA 134* 134* 134*  K 3.8 3.9 3.6  CL 103 104 103  CO2 25 23 24   GLUCOSE 95 111* 119*  BUN 60* 59* 69*  CREATININE 2.84* 2.96* 3.97*  CALCIUM 8.3* 8.2* 8.6*    Liver Function Tests:  Recent Labs Lab 01/10/15 1834 01/11/15 0453 01/15/15 2359  AST 53* 47* 52*  ALT 31 28 21   ALKPHOS 83 75 107  BILITOT 1.3* 1.6* 1.0  PROT 5.4* 5.0* 5.1*  ALBUMIN 2.3* 2.2* 2.2*    Recent Labs Lab 01/10/15 1834  LIPASE 16*   No results for input(s): AMMONIA in the last 168 hours.  CBC:  Recent Labs Lab 01/10/15 1834 01/11/15 0453 01/15/15 2359  WBC 13.6* 15.1* 19.2*  NEUTROABS  --   --  17.1*  HGB 12.5* 11.7* 12.0*  HCT 37.0* 35.3* 36.4*  MCV 102.4* 102.4* 103.9*  PLT 171 165 217    Cardiac Enzymes:  Recent Labs Lab 01/10/15 1834 01/10/15 2304 01/11/15 0453 01/11/15 1130 01/11/15 1600  TROPONINI 0.29* 0.03 0.04* 0.80* 0.04*    BNP: Invalid input(s): POCBNP  CBG:  Recent Labs Lab 01/10/15 1826  GLUCAP 99    Microbiology: Results for orders placed or performed during the hospital encounter of 01/10/15  Stool culture     Status: None   Collection Time: 01/11/15  9:25 AM  Result Value Ref Range Status   Specimen Description STOOL  Final   Special Requests NONE  Final   Culture   Final    NO SALMONELLA OR SHIGELLA ISOLATED No Pathogenic E. coli detected NO CAMPYLOBACTER DETECTED    Report Status 01/15/2015 FINAL  Final  C difficile quick scan w PCR reflex     Status: Abnormal   Collection Time: 01/11/15  9:25 AM  Result Value Ref Range Status   C Diff antigen POSITIVE (A) NEGATIVE Final   C Diff toxin POSITIVE (A) NEGATIVE Final   C Diff interpretation   Final    Positive for toxigenic C. difficile, active toxin production present.    Comment: DOLL FERGUSON AT 3846 ON  01/11/15 BY [facility] CRITICAL RESULT CALLED TO, READ BACK BY AND VERIFIED WITH:     Coagulation Studies:  Recent Labs  01/15/15 2359  LABPROT 25.6*  INR 2.32    Urinalysis: No results for input(s): COLORURINE, LABSPEC, PHURINE, GLUCOSEU, HGBUR, BILIRUBINUR, KETONESUR, PROTEINUR, UROBILINOGEN, NITRITE, LEUKOCYTESUR in the last 72 hours.  Invalid input(s): APPERANCEUR    Imaging: Dg Chest 1 View  01/16/2015   CLINICAL DATA:  Weakness and leukocytosis. White cell count 19,000. History of congestive heart failure and atrial fibrillation.  EXAM: CHEST  1 VIEW  COMPARISON:  None.  FINDINGS: Postoperative changes in the mediastinum. Shallow inspiration. Cardiac enlargement with mild pulmonary vascular congestion. No edema or consolidation. Slight blunting of left costophrenic angle suggesting a small effusion. No pneumothorax. Mediastinal contours appear intact.  IMPRESSION: Mild cardiac enlargement and mild pulmonary vascular congestion without edema or consolidation. Probable small left pleural effusion.   Electronically Signed   By: Lucienne Capers M.D.   On: 01/16/2015 02:09      Assessment & Plan: Blake White is a 81 y.o. white male with congestive heart failure, atrial fibrillation, hypothyroidism, BPH, hyperlipidemia, hypertension, coronary artery disease status post CABG, who was admitted to [facility] on 01/15/2015 for C. Diff colitis.   1. Acute kidney failure on chronic kidney disease stage IV 2. Acute exacerbation of congestive heart failure: 3. Hypertension 4. Edema: generalized.  Plan:  Acute renal failure seems to be multifactorial with prerenal azotemia and now with acute cardiorenal syndrome.  Chronic kidney disease stage IV followed by [facility]. Baseline creatinine of 2.4, eGFR of 26.  - Stop all oral diuretics: furosemide, hydrochlorothiazide and metolazone.  - Discontinue IV fluids, currently on NS at 145m/hr - Check renal ultrasound and urine studies.  - Check  echocardiogram - start Fluid restriction    LOS: 0 Kennadee Walthour 9/10/201611:11 AM

## 2015-01-16 NOTE — Progress Notes (Signed)
[facility] [place] at [facility]   PATIENT NAME: Blake White    MR#:  [id number]  DATE OF BIRTH:  1934/02/04  SUBJECTIVE:  CHIEF COMPLAINT:  Patient is still having diarrhea and gained weight. Reporting sob with min exertion. Gettting BLLE wound care with UNA  REVIEW OF SYSTEMS:  CONSTITUTIONAL: No fever, fatigue or weakness.  EYES: No blurred or double vision.  EARS, NOSE, AND THROAT: No tinnitus or ear pain.  RESPIRATORY: No cough, reporting exetrional shortness of breath, wheezing or hemoptysis.  CARDIOVASCULAR: No chest pain, orthopnea, edema.  GASTROINTESTINAL: No nausea, vomiting, diarrhea or abdominal pain.  GENITOURINARY: No dysuria, hematuria.  ENDOCRINE: No polyuria, nocturia,  HEMATOLOGY: No anemia, easy bruising or bleeding SKIN: No rash or lesion. MUSCULOSKELETAL: No joint pain or arthritis.   NEUROLOGIC: No tingling, numbness, weakness.  PSYCHIATRY: No anxiety or depression.   DRUG ALLERGIES:  No Known Allergies  VITALS:  Blood pressure 94/50, pulse 58, temperature 97.6 F (36.4 C), temperature source Oral, resp. rate 18, height 5\' 8"  (1.727 m), weight 109.77 kg (242 lb), SpO2 98 %.  PHYSICAL EXAMINATION:  GENERAL:  81 y.o.-year-old patient lying in the bed with no acute distress.  EYES: Pupils equal, round, reactive to light and accommodation. No scleral icterus. Extraocular muscles intact.  HEENT: Head atraumatic, normocephalic. Oropharynx and nasopharynx clear.  NECK:  Supple, no jugular venous distention. No thyroid enlargement, no tenderness.  LUNGS: Normal breath sounds bilaterally, no wheezing, rales,rhonchi or crepitation. No use of accessory muscles of respiration.  CARDIOVASCULAR: S1, S2 normal. No murmurs, rubs, or gallops.  ABDOMEN: Soft, nontender, nondistended. Bowel sounds present. No organomegaly or mass.  EXTREMITIES: No pedal edema, cyanosis, or clubbing.  NEUROLOGIC: Cranial nerves II through XII are intact. Muscle  strength 5/5 in all extremities. Sensation intact. Gait not checked.  PSYCHIATRIC: The patient is alert and oriented x 3.  SKIN: No obvious rash, lesion, or ulcer.    LABORATORY PANEL:   CBC  Recent Labs Lab 01/15/15 2359  WBC 19.2*  HGB 12.0*  HCT 36.4*  PLT 217   ------------------------------------------------------------------------------------------------------------------  Chemistries   Recent Labs Lab 01/15/15 2359  NA 134*  K 3.6  CL 103  CO2 24  GLUCOSE 119*  BUN 69*  CREATININE 3.97*  CALCIUM 8.6*  AST 52*  ALT 21  ALKPHOS 107  BILITOT 1.0   ------------------------------------------------------------------------------------------------------------------  Cardiac Enzymes  Recent Labs Lab 01/11/15 1600  TROPONINI 0.04*   ------------------------------------------------------------------------------------------------------------------  RADIOLOGY:  Dg Chest 1 View  01/16/2015   CLINICAL DATA:  Weakness and leukocytosis. White cell count 19,000. History of congestive heart failure and atrial fibrillation.  EXAM: CHEST  1 VIEW  COMPARISON:  None.  FINDINGS: Postoperative changes in the mediastinum. Shallow inspiration. Cardiac enlargement with mild pulmonary vascular congestion. No edema or consolidation. Slight blunting of left costophrenic angle suggesting a small effusion. No pneumothorax. Mediastinal contours appear intact.  IMPRESSION: Mild cardiac enlargement and mild pulmonary vascular congestion without edema or consolidation. Probable small left pleural effusion.   Electronically Signed   By: Burman Nieves M.D.   On: 01/16/2015 02:09    EKG:   Orders placed or performed during the hospital encounter of 01/15/15  . EKG 12-Lead  . EKG 12-Lead    ASSESSMENT AND PLAN:   Assessment/Plan This is a 81 year old Caucasian male with past medical history significant for heart failure and chronic kidney disease admitted for severe C. difficile  colitis and worsening renal failure. 1. C. difficile colitis:  White count is more than 15. The patient been discharged with Flagyl 3 times a day.  Will D/C flagyl as clinically not better and continue oral vancomycin 250 mg for 2 weeks to this.  Will get  Abdominal US  on enteric precautions Appreciate GI recs  2. Acute on chronic kidney disease stage IV: Worsening of CKD stage IV sec to prerenal and cardiorenal syn .  Baseline cr is 2.4 and sees [facility] nephrology -d/c PO diuretics - lasix, HCTZ and metolazone  We will avoid nephrotoxic agents  Appreciate  nephrology recommendations. Check renal US  3.Acute on chronic  congestive heart failure:   Will get Echo Strict intake and output Daily weight monitoring Fluid restriction  4. A. fib: Rate controlled. Continue amiodarone and coumadin management per pharmacy  5. Hypothyroidism: Continue Synthroid  6. PVD - Neomia Dear /wound care  6. DVT prophylaxis: as above      All the records are reviewed and case discussed with Care Management/Social Workerr. Management plans discussed with the patient, family and they are in agreement.  CODE STATUS: full code  TOTAL TIME TAKING CARE OF THIS PATIENT: 35 minutes.   POSSIBLE D/C IN 2-3 DAYS, DEPENDING ON CLINICAL CONDITION.   Ramonita Lab M.D on 01/16/2015 at 4:23 PM  Between 7am to 6pm - Pager - [phone] After 6pm go to [email or web address] - password [email or web address]  [facility]  Office  [phone]  CC: Primary care physician; Sula Rumple, MD

## 2015-01-16 NOTE — H&P (Addendum)
Blake White is an 81 y.o. male.   Chief Complaint: Weakness HPI: Patient presents emergency department complaining of weakness in his lower extremities. He was discharged from the hospital 4 days ago on treatment for Clostridium difficile colitis. He admits to continued diarrhea as well as swelling in his lower extremities. He admits also to not having energy but denies nausea, vomiting, chest pain or shortness of breath. In the emergency department the patient was found to have worsening kidney function as well as an increase blood cell count which prompted emergency department staff to call for admission.  Past Medical History  Diagnosis Date  . CHF (congestive heart failure)   . BPH (benign prostatic hyperplasia)   . A-fib   . Thyroid disease   . High cholesterol     Past Surgical History  Procedure Laterality Date  . Vein bypass surgery    . Vascular surgery    . Cardiac surgery      quad bypass    Family History  Problem Relation Age of Onset  . Diabetes Mellitus II Mother   . Lung cancer Brother    Social History:  reports that he has quit smoking. He does not have any smokeless tobacco history on file. He reports that he does not drink alcohol. His drug history is not on file.  Allergies: No Known Allergies  Medications Prior to Admission  Medication Sig Dispense Refill  . allopurinol (ZYLOPRIM) 300 MG tablet Take 150 mg by mouth daily.  11  . amiodarone (PACERONE) 200 MG tablet Take 200 mg by mouth daily.  1  . aspirin EC 81 MG tablet Take 81 mg by mouth daily.    . Cholecalciferol (VITAMIN D3) 1000 UNITS CAPS Take 1,000 Units by mouth daily.    . furosemide (LASIX) 40 MG tablet Take 40 mg by mouth daily.  2  . hydrochlorothiazide (HYDRODIURIL) 25 MG tablet Take 25 mg by mouth daily.  1  . levothyroxine (SYNTHROID, LEVOTHROID) 150 MCG tablet Take 150 mcg by mouth daily before breakfast.  1  . metolazone (ZAROXOLYN) 5 MG tablet Take 5 mg by mouth daily.    .  metroNIDAZOLE (FLAGYL) 500 MG tablet Take 1 tablet (500 mg total) by mouth every 8 (eight) hours. 24 tablet 0  . potassium chloride SA (K-DUR,KLOR-CON) 20 MEQ tablet Take 20 mEq by mouth 2 (two) times daily.  6  . saccharomyces boulardii (FLORASTOR) 250 MG capsule Take 1 capsule (250 mg total) by mouth 2 (two) times daily. 20 capsule 0  . simvastatin (ZOCOR) 40 MG tablet Take 40 mg by mouth at bedtime.  1  . warfarin (COUMADIN) 3 MG tablet Take 1 tablet (3 mg total) by mouth every evening. 30 tablet 1    Results for orders placed or performed during the hospital encounter of 01/15/15 (from the past 48 hour(s))  CBC with Differential     Status: Abnormal   Collection Time: 01/15/15 11:59 PM  Result Value Ref Range   WBC 19.2 (H) 3.8 - 10.6 K/uL   RBC 3.51 (L) 4.40 - 5.90 MIL/uL   Hemoglobin 12.0 (L) 13.0 - 18.0 g/dL   HCT 36.4 (L) 40.0 - 52.0 %   MCV 103.9 (H) 80.0 - 100.0 fL   MCH 34.1 (H) 26.0 - 34.0 pg   MCHC 32.8 32.0 - 36.0 g/dL   RDW 16.8 (H) 11.5 - 14.5 %   Platelets 217 150 - 440 K/uL   Neutrophils Relative % 89 %  Neutro Abs 17.1 (H) 1.4 - 6.5 K/uL   Lymphocytes Relative 3 %   Lymphs Abs 0.5 (L) 1.0 - 3.6 K/uL   Monocytes Relative 5 %   Monocytes Absolute 0.9 0.2 - 1.0 K/uL   Eosinophils Relative 3 %   Eosinophils Absolute 0.6 0 - 0.7 K/uL   Basophils Relative 0 %   Basophils Absolute 0.1 0 - 0.1 K/uL  Comprehensive metabolic panel     Status: Abnormal   Collection Time: 01/15/15 11:59 PM  Result Value Ref Range   Sodium 134 (L) 135 - 145 mmol/L   Potassium 3.6 3.5 - 5.1 mmol/L   Chloride 103 101 - 111 mmol/L   CO2 24 22 - 32 mmol/L   Glucose, Bld 119 (H) 65 - 99 mg/dL   BUN 69 (H) 6 - 20 mg/dL   Creatinine, Ser 3.97 (H) 0.61 - 1.24 mg/dL   Calcium 8.6 (L) 8.9 - 10.3 mg/dL   Total Protein 5.1 (L) 6.5 - 8.1 g/dL   Albumin 2.2 (L) 3.5 - 5.0 g/dL   AST 52 (H) 15 - 41 U/L   ALT 21 17 - 63 U/L   Alkaline Phosphatase 107 38 - 126 U/L   Total Bilirubin 1.0 0.3 - 1.2  mg/dL   GFR calc non Af Amer 13 (L) >60 mL/min   GFR calc Af Amer 15 (L) >60 mL/min    Comment: (NOTE) The eGFR has been calculated using the CKD EPI equation. This calculation has not been validated in all clinical situations. eGFR's persistently <60 mL/min signify possible Chronic Kidney Disease.    Anion gap 7 5 - 15  Protime-INR     Status: Abnormal   Collection Time: 01/15/15 11:59 PM  Result Value Ref Range   Prothrombin Time 25.6 (H) 11.4 - 15.0 seconds   INR 2.32   TSH     Status: None   Collection Time: 01/16/15 12:12 AM  Result Value Ref Range   TSH 2.181 0.350 - 4.500 uIU/mL   Dg Chest 1 View  01/16/2015   CLINICAL DATA:  Weakness and leukocytosis. White cell count 19,000. History of congestive heart failure and atrial fibrillation.  EXAM: CHEST  1 VIEW  COMPARISON:  None.  FINDINGS: Postoperative changes in the mediastinum. Shallow inspiration. Cardiac enlargement with mild pulmonary vascular congestion. No edema or consolidation. Slight blunting of left costophrenic angle suggesting a small effusion. No pneumothorax. Mediastinal contours appear intact.  IMPRESSION: Mild cardiac enlargement and mild pulmonary vascular congestion without edema or consolidation. Probable small left pleural effusion.   Electronically Signed   By: Lucienne Capers M.D.   On: 01/16/2015 02:09    Review of Systems  Constitutional: Negative for fever and chills.  HENT: Negative for sore throat and tinnitus.   Eyes: Negative for blurred vision and redness.  Respiratory: Positive for cough. Negative for shortness of breath.   Cardiovascular: Negative for chest pain, palpitations, orthopnea and PND.  Gastrointestinal: Negative for nausea, vomiting, abdominal pain and diarrhea.  Genitourinary: Negative for dysuria, urgency and frequency.  Musculoskeletal: Negative for myalgias and joint pain.  Skin: Negative for rash.       No lesions  Neurological: Positive for weakness. Negative for speech  change and focal weakness.  Endo/Heme/Allergies: Does not bruise/bleed easily.       No temperature intolerance  Psychiatric/Behavioral: Negative for depression and suicidal ideas.    Blood pressure 95/51, pulse 64, temperature 98.1 F (36.7 C), temperature source Oral, resp. rate 18, height 5'  8" (1.727 m), weight 109.77 kg (242 lb), SpO2 98 %. Physical Exam  Nursing note and vitals reviewed. Constitutional: He is oriented to person, place, and time. He appears well-developed and well-nourished. No distress.  HENT:  Head: Normocephalic and atraumatic.  Mouth/Throat: Oropharynx is clear and moist.  Eyes: Conjunctivae and EOM are normal. Pupils are equal, round, and reactive to light. No scleral icterus.  Neck: Normal range of motion. Neck supple. No JVD present. No tracheal deviation present. No thyromegaly present.  Cardiovascular: Normal rate and regular rhythm.  Exam reveals no gallop and no friction rub.   No murmur heard. Heart sounds somewhat distant  Respiratory: Effort normal and breath sounds normal. No respiratory distress.  GI: Soft. Bowel sounds are normal. He exhibits distension. There is no tenderness.  Genitourinary:  Deferred  Musculoskeletal: Normal range of motion. He exhibits edema and tenderness.  Lymphadenopathy:    He has no cervical adenopathy.  Neurological: He is alert and oriented to person, place, and time. No cranial nerve deficit.  Skin: Skin is warm and dry.  Lower extremities wrapped  Psychiatric: He has a normal mood and affect. His behavior is normal. Judgment and thought content normal.     Assessment/Plan This is a 81 year old Caucasian male with past medical history significant for heart failure and chronic kidney disease admitted for severe C. difficile colitis and worsening renal failure. 1. C. difficile colitis: White count is more than 15. The patient been discharged with Flagyl 3 times a day. Will add oral vancomycin to this. The patient has  no abdominal tenderness thus we will defer abdominal imaging at this time. Placed on enteric precautions 2. Acute on chronic kidney disease: Worsening of CKD stage IV to end-stage disease likely secondary to dehydration from diuresis on previous admission in addition to total body losses from diarrhea. The patient has edema but is likely intravascularly dry. I have placed him on gentle IV hydration with normal saline. We will avoid nephrotoxic agents and I have placed a nephrology consult for further guidance. 3. Congestive heart failure: Chronic; presumably ischemic cardiomyopathy. It does not appear that he is in overt heart failure at this time. He admits to leg and groin swelling but this is likely due to worsening renal function. I have reviewed his notes from cardiology but cannot find an echo report at this time. Chest x-ray does not show pulmonary edema. Thus I will try aim to achieve even fluid balance for this admission. Continue diuretic therapy per home regimen. 4. A. fib: Rate controlled. Continue amiodarone and coumadin 5. Hypothyroidism: Continue Synthroid 6. DVT prophylaxis: as above  7. GI prophylaxis: None The patient is a full code. Time spent on admission was in patient care approximately 35 minutes   Harrie Foreman 01/16/2015, 5:28 AM

## 2015-01-16 NOTE — Progress Notes (Signed)
Initial Nutrition Assessment   INTERVENTION:   Meals and Snacks: Cater to patient preferences; RD notes fluid restriction per nephrology   NUTRITION DIAGNOSIS:   Inadequate oral intake related to acute illness as evidenced by per patient/family report.  GOAL:   Patient will meet greater than or equal to 90% of their needs  MONITOR:    (Energy Intake, Digestive system, Anthropometrics, Electrolyte and renal Profile)  REASON FOR ASSESSMENT:   Diagnosis    ASSESSMENT:   Pt admitted with acute on chronic renal failure with generalized edema. Pt recently discharged 4 days ago with c.diff and LLE.   Past Medical History  Diagnosis Date  . CHF (congestive heart failure)   . BPH (benign prostatic hyperplasia)   . A-fib   . Thyroid disease   . High cholesterol      Diet Order:  Diet Heart Room service appropriate?: Yes; Fluid consistency:: Thin; Fluid restriction:: 1500 mL Fluid    Current Nutrition: Pt ate good breakfast 100% this am per report. Pt ate breakfast late and therefore has not been hungry for lunch. Lunch tray in room but untouched.  Food/Nutrition-Related History: Pt reports PTA since last admission, pt's appetite has been 'fine' but pt experiencing early satiety at meals secondary to fluid accumulation and feeling full per report.   Medications: coumadin, KCl, Flagyl, lasix, vitamin D  Electrolyte/Renal Profile and Glucose Profile:   Recent Labs Lab 01/11/15 0453 01/12/15 0321 01/15/15 2359  NA 134* 134* 134*  K 3.8 3.9 3.6  CL 103 104 103  CO2 BUN 60* 59* 69*  CREATININE 2.84* 2.96* 3.97*  CALCIUM 8.3* 8.2* 8.6*  GLUCOSE 95 111* 119*   Protein Profile:  Recent Labs Lab 01/10/15 1834 01/11/15 0453 01/15/15 2359  ALBUMIN 2.3* 2.2* 2.2*    Gastrointestinal Profile: Last BM:  01/15/2015   Nutrition-Focused Physical Exam Findings: Nutrition-Focused physical exam completed. Findings are WDL for fat depletion, muscle depletion, and  2-3+ generalized edema.     Weight Change: Pt reports weight gain. Pt last admission 01/12/2015 weight of 235lbs.    Skin:  Reviewed, no issues  Height:   Ht Readings from Last 1 Encounters:  01/16/15  (1.727 m)    Weight:   Wt Readings from Last 1 Encounters:  01/16/15 242 lb (109.77 kg)   Wt Readings from Last 10 Encounters:  01/16/15 242 lb (109.77 kg)  01/12/15 235 lb 3.2 oz (106.686 kg)    Ideal Body Weight: 70kg  BMI:  Body mass index is 36.8 kg/(m^2).  Estimated Nutritional Needs:   Kcal:  BEE: 1385kcals, TEE: (IF 1.1-1.3)(AF 1.2) 1828-2160kcals, using IBW of 70kg  Protein:  56-70g protein (0.8-1.0g/kg)  Fluid:  1750-212mL of fluid (25-24mL/kg)  EDUCATION NEEDS:   No education needs identified at this time  MODERATE Care Level  Leda Quail, RD, LDN Pager [phone]

## 2015-01-16 NOTE — Plan of Care (Signed)
Problem: Discharge Progression Outcomes Goal: Other Discharge Outcomes/Goals Outcome: Progressing Plan of care progress to goal: Enteric precautions remain. IVF stopped. Lasix given. ECHO ordered. No c/o pain. BP slightly low, MD aware. Calls out for needs.  Tolerating diet. On fluid restriction. U/S of abdomen and kidneys ordered tomorrow. Educated family about isolation precautions needed to take.

## 2015-01-16 NOTE — Plan of Care (Signed)
Problem: Discharge Progression Outcomes Goal: Discharge plan in place and appropriate Individualization: Pt prefers to be called Blake White who lives with wife and daughter.  Hx CHF, BPH, A fib, high cholesterol, & thyroid disease controlled by home medications.  High fall risk. Bed alarm on, hourly rounding. Pt understands how to use call system for assistance.

## 2015-01-16 NOTE — Progress Notes (Signed)
ANTICOAGULATION CONSULT NOTE - Initial Consult  Pharmacy Consult for warfarin dosing and monitoring Indication: atrial fibrillation  No Known Allergies  Patient Measurements: Height:  (172.7 cm) Weight: 242 lb (109.77 kg) IBW/kg (Calculated) : 68.4   Vital Signs: Temp: 98.1 F (36.7 C) (09/10 0537) Temp Source: Oral (09/10 0537) BP: 98/55 mmHg (09/10 0920) Pulse Rate: 65 (09/10 0920)  Labs:  Recent Labs  01/15/15 2359  HGB 12.0*  HCT 36.4*  PLT 217  LABPROT 25.6*  INR 2.32  CREATININE 3.97*    Estimated Creatinine Clearance: 18.1 mL/min (by C-G formula based on Cr of 3.97).   Medical History: Past Medical History  Diagnosis Date  . CHF (congestive heart failure)   . BPH (benign prostatic hyperplasia)   . A-fib   . Thyroid disease   . High cholesterol     Medications:  Prescriptions prior to admission  Medication Sig Dispense Refill Last Dose  . allopurinol (ZYLOPRIM) 300 MG tablet Take 150 mg by mouth daily.  11 01/15/2015 at Unknown time  . amiodarone (PACERONE) 200 MG tablet Take 200 mg by mouth daily.  1 01/15/2015 at Unknown time  . aspirin EC 81 MG tablet Take 81 mg by mouth daily.   01/15/2015 at Unknown time  . Cholecalciferol (VITAMIN D3) 1000 UNITS CAPS Take 1,000 Units by mouth daily.   01/15/2015 at Unknown time  . furosemide (LASIX) 40 MG tablet Take 40 mg by mouth daily.  2 01/15/2015 at Unknown time  . hydrochlorothiazide (HYDRODIURIL) 25 MG tablet Take 25 mg by mouth daily.  1 01/15/2015 at Unknown time  . levothyroxine (SYNTHROID, LEVOTHROID) 150 MCG tablet Take 150 mcg by mouth daily before breakfast.  1 01/15/2015 at Unknown time  . metolazone (ZAROXOLYN) 5 MG tablet Take 5 mg by mouth daily.   01/15/2015 at Unknown time  . metroNIDAZOLE (FLAGYL) 500 MG tablet Take 1 tablet (500 mg total) by mouth every 8 (eight) hours. 24 tablet 0 01/15/2015 at 1630  . potassium chloride SA (K-DUR,KLOR-CON) 20 MEQ tablet Take 20 mEq by mouth 2 (two) times daily.  6  01/15/2015 at Unknown time  . saccharomyces boulardii (FLORASTOR) 250 MG capsule Take 1 capsule (250 mg total) by mouth 2 (two) times daily. 20 capsule 0 01/15/2015 at Unknown time  . simvastatin (ZOCOR) 40 MG tablet Take 40 mg by mouth at bedtime.  1 01/14/2015 at Unknown time  . warfarin (COUMADIN) 3 MG tablet Take 1 tablet (3 mg total) by mouth every evening. 30 tablet 1 01/14/2015 at Unknown time    Assessment: Patient is a 81yo male who has a PMH of A.Fib, C.Diff, CKD, hypothyroidism and CHF . Patient was controlled on warfarin  for A.Fib prior to hospital admission. He is currently on metronidazole regimen for positive C. Diff toxin and positive C.diff Antigen. Pharmacy has been consulted to dose and monitor warfarin. INR 2.32, PT 25.6, and hgb 12. Pharmacy is aware metronidazole may cause an increase in patients INR.   Goal of Therapy:  INR 2-3    Plan:  Will continue patients home dose of  daily. PT-INR ordered for 0500 tomorrow. Pharmacy will continue to monitor patients INR daily and adjust warfarin as needed.   Cher Nakai, PharmD Pharmacy Resident

## 2015-01-16 NOTE — Consult Note (Signed)
GI Inpatient Consult Note  Reason for Consult: c diff diarrhea   Attending Requesting Consult: Dr Sheryle Hail  History of Present Illness: Blake White is a 81 y.o. male with past medical history notable for congestive heart failure, A. fib who is presenting for evaluation of diarrhea and lower extremity and abdominal swelling. Blake White was just discharged on September 6 on a course of Flagyl for C. difficile. His diarrhea had decreased to 2-3 bowel movements per day but continued to be loose and watery. However the reason he presented to the emergency room was rapid onset of lower extremity and abdominal swelling. He denies any prior history of trouble with abdominal or lower extremity swelling although he does have a history of congestive heart failure.  In the emergency room he was noted to have a significantly elevated white count along with worsening chronic kidney disease and was admitted to the hospital for further management. In hospital by mouth vancomycin was added in addition to the Flagyl. He does report about 2-3 bowel movements in the past 24 hours. However he does continue to mostly be bothered by the abdominal and lower extremity swelling. Denies any chest pain shortness of breath. No fevers chills. He does see a small amount of bright red blood in his stools which she attributes to his hemorrhoids.    Past Medical History:  Past Medical History  Diagnosis Date  . CHF (congestive heart failure)   . BPH (benign prostatic hyperplasia)   . A-fib   . Thyroid disease   . High cholesterol     Problem List: Patient Active Problem List   Diagnosis Date Noted  . Acute kidney injury 01/16/2015  . Abdominal pain 01/10/2015  . Diarrhea 01/10/2015    Past Surgical History: Past Surgical History  Procedure Laterality Date  . Vein bypass surgery    . Vascular surgery    . Cardiac surgery      quad bypass    Allergies: No Known Allergies  Home Medications: Prescriptions prior  to admission  Medication Sig Dispense Refill Last Dose  . allopurinol (ZYLOPRIM) 300 MG tablet Take 150 mg by mouth daily.  11 01/15/2015 at Unknown time  . amiodarone (PACERONE) 200 MG tablet Take 200 mg by mouth daily.  1 01/15/2015 at Unknown time  . aspirin EC 81 MG tablet Take 81 mg by mouth daily.   01/15/2015 at Unknown time  . Cholecalciferol (VITAMIN D3) 1000 UNITS CAPS Take 1,000 Units by mouth daily.   01/15/2015 at Unknown time  . furosemide (LASIX) 40 MG tablet Take 40 mg by mouth daily.  2 01/15/2015 at Unknown time  . hydrochlorothiazide (HYDRODIURIL) 25 MG tablet Take 25 mg by mouth daily.  1 01/15/2015 at Unknown time  . levothyroxine (SYNTHROID, LEVOTHROID) 150 MCG tablet Take 150 mcg by mouth daily before breakfast.  1 01/15/2015 at Unknown time  . metolazone (ZAROXOLYN) 5 MG tablet Take 5 mg by mouth daily.   01/15/2015 at Unknown time  . metroNIDAZOLE (FLAGYL) 500 MG tablet Take 1 tablet (500 mg total) by mouth every 8 (eight) hours. 24 tablet 0 01/15/2015 at 1630  . potassium chloride SA (K-DUR,KLOR-CON) 20 MEQ tablet Take 20 mEq by mouth 2 (two) times daily.  6 01/15/2015 at Unknown time  . saccharomyces boulardii (FLORASTOR) 250 MG capsule Take 1 capsule (250 mg total) by mouth 2 (two) times daily. 20 capsule 0 01/15/2015 at Unknown time  . simvastatin (ZOCOR) 40 MG tablet Take 40 mg by mouth  at bedtime.  1 01/14/2015 at Unknown time  . warfarin (COUMADIN) 3 MG tablet Take 1 tablet (3 mg total) by mouth every evening. 30 tablet 1 01/14/2015 at Unknown time   Home medication reconciliation was completed with the patient.   Scheduled Inpatient Medications:   . allopurinol  150 mg Oral Daily  . amiodarone  200 mg Oral Daily  . aspirin EC  81 mg Oral Daily  . cholecalciferol  1,000 Units Oral Daily  . furosemide  40 mg Intravenous Q12H  . levothyroxine  150 mcg Oral QAC breakfast  . metroNIDAZOLE  500 mg Oral 3 times per day  . potassium chloride SA  20 mEq Oral BID  . simvastatin  40 mg Oral  QHS  . sodium chloride  3 mL Intravenous Q12H  . vancomycin  250 mg Oral 4 times per day  . warfarin  3 mg Oral QPM    Continuous Inpatient Infusions:     PRN Inpatient Medications:  acetaminophen **OR** acetaminophen, morphine injection, ondansetron **OR** ondansetron (ZOFRAN) IV  Family History: family history includes Diabetes Mellitus II in his mother; Lung cancer in his brother.  The patient's family history is negative for inflammatory bowel disorders, GI malignancy, or solid organ transplantation.  Social History:   reports that he has quit smoking. He does not have any smokeless tobacco history on file. He reports that he does not drink alcohol.  Review of Systems: Constitutional: Weight is stable.  Eyes: No changes in vision. ENT: No oral lesions, sore throat.  GI: see HPI.  Heme/Lymph: No easy bruising.  CV: No chest pain.  GU: No hematuria.  Integumentary: No rashes.  Neuro: No headaches.  Psych: No depression/anxiety.  Endocrine: No heat/cold intolerance.  Allergic/Immunologic: No urticaria.  Resp: No cough, SOB.  Musculoskeletal: No joint swelling.    Physical Examination: BP 94/50 mmHg  Pulse 58  Temp(Src) 97.6 F (36.4 C) (Oral)  Resp 18  Ht 5\' 8"  (1.727 m)  Wt 109.77 kg (242 lb)  BMI 36.80 kg/m2  SpO2 98% Gen: NAD, alert and oriented x 4 HEENT: PEERLA, EOMI, Neck: supple, no JVD or thyromegaly Chest: CTA bilaterally, no wheezes, crackles, or other adventitious sounds CV: RRR, no m/g/c/r Abd: soft, + distended but not taught, no r/g, nabs.  Ext: 2+ edema bilat, well perf Skin: no rash or lesions noted Lymph: no LAD  Data: Lab Results  Component Value Date   WBC 19.2* 01/15/2015   HGB 12.0* 01/15/2015   HCT 36.4* 01/15/2015   MCV 103.9* 01/15/2015   PLT 217 01/15/2015    Recent Labs Lab 01/10/15 1834 01/11/15 0453 01/15/15 2359  HGB 12.5* 11.7* 12.0*   Lab Results  Component Value Date   NA 134* 01/15/2015   K 3.6 01/15/2015   CL  103 01/15/2015   CO2 24 01/15/2015   BUN 69* 01/15/2015   CREATININE 3.97* 01/15/2015   Lab Results  Component Value Date   ALT 21 01/15/2015   AST 52* 01/15/2015   ALKPHOS 107 01/15/2015   BILITOT 1.0 01/15/2015    Recent Labs Lab 01/09/15 2000  01/15/15 2359  APTT 61*  --   --   INR 4.10*  < > 2.32  < > = values in this interval not displayed.   Assessment/Plan: Blake White is a 81 y.o. male with C. difficile colitis admitted with lower extremity swelling and abdominal swelling, acute on chronic renal failure. Although his diarrhea had gotten better on the Flagyl, he was  still having 2-3 watery stools per day at home. He also had a significant elevated white count on presentation.  Therefore I do agree with adding by mouth vancomycin. However is bigger problem seems to be the abdominal lower extremity swelling and worsening renal insufficiency.  Recommendations: - Continue by mouth vancomycin 2520 q6 for 14 days. - Okay to DC the Flagyl. - Management of lower extremity edema and renal insufficiency per the primary team. - Abdominal ultrasound to evaluate for source of abdominal swelling. - No indication for colonoscopy at this time.  Thank you for the consult. Please call with questions or concerns.  REIN, Addison Naegeli, MD

## 2015-01-17 ENCOUNTER — Inpatient Hospital Stay: Payer: Medicare Other

## 2015-01-17 ENCOUNTER — Inpatient Hospital Stay (HOSPITAL_COMMUNITY)
Admit: 2015-01-17 | Discharge: 2015-01-17 | Disposition: A | Discharge status: Home / Self Care | Payer: Medicare Other | Attending: Nephrology | Admitting: Nephrology

## 2015-01-17 DIAGNOSIS — I509 Heart failure, unspecified: Secondary | ICD-10-CM

## 2015-01-17 LAB — CBC
HEMATOCRIT: 34.3 % — AB (ref 40.0–52.0)
HEMOGLOBIN: 11.6 g/dL — AB (ref 13.0–18.0)
MCH: 35 pg — ABNORMAL HIGH (ref 26.0–34.0)
MCHC: 33.8 g/dL (ref 32.0–36.0)
MCV: 103.5 fL — ABNORMAL HIGH (ref 80.0–100.0)
Platelets: 221 10*3/uL (ref 150–440)
RBC: 3.31 MIL/uL — AB (ref 4.40–5.90)
RDW: 16.9 % — ABNORMAL HIGH (ref 11.5–14.5)
WBC: 8.5 10*3/uL (ref 3.8–10.6)

## 2015-01-17 LAB — BASIC METABOLIC PANEL
ANION GAP: 8 (ref 5–15)
BUN: 76 mg/dL — ABNORMAL HIGH (ref 6–20)
CALCIUM: 8.5 mg/dL — AB (ref 8.9–10.3)
CO2: 24 mmol/L (ref 22–32)
Chloride: 104 mmol/L (ref 101–111)
Creatinine, Ser: 4.08 mg/dL — ABNORMAL HIGH (ref 0.61–1.24)
GFR, EST AFRICAN AMERICAN: 15 mL/min — AB (ref >60)
GFR, EST NON AFRICAN AMERICAN: 13 mL/min — AB (ref >60)
GLUCOSE: 99 mg/dL (ref 65–99)
POTASSIUM: 3.4 mmol/L — AB (ref 3.5–5.1)
Sodium: 136 mmol/L (ref 135–145)

## 2015-01-17 LAB — PROTIME-INR
INR: 2.17
PROTHROMBIN TIME: 24.3 s — AB (ref 11.4–15.0)

## 2015-01-17 NOTE — Progress Notes (Signed)
GI Inpatient Follow-up Note  Patient Identification: Blake White is a 81 y.o. male with c.diff diarhea, volume overload  Subjective:  No more diarrhea, no stool since yesterday.  LE and abd swelling imrpoved somewhat on lasix.  NO f/c, no n/v, no blood in stool.   Scheduled Inpatient Medications:  . allopurinol  150 mg Oral Daily  . amiodarone  200 mg Oral Daily  . aspirin EC  81 mg Oral Daily  . cholecalciferol  1,000 Units Oral Daily  . furosemide  40 mg Intravenous Q12H  . levothyroxine  150 mcg Oral QAC breakfast  . potassium chloride SA  20 mEq Oral BID  . simvastatin  40 mg Oral QHS  . sodium chloride  3 mL Intravenous Q12H  . vancomycin  250 mg Oral 4 times per day  . warfarin  3 mg Oral QPM    Continuous Inpatient Infusions:     PRN Inpatient Medications:  acetaminophen **OR** acetaminophen, morphine injection, ondansetron **OR** ondansetron (ZOFRAN) IV     Physical Examination: BP 97/54 mmHg  Pulse 58  Temp(Src) 97.8 F (36.6 C) (Oral)  Resp 22  Ht  (1.727 m)  Wt 108.636 kg (239 lb 8 oz)  BMI 36.42 kg/m2  SpO2 98% Gen: NAD, alert and oriented x 4  Neck: supple, no JVD or thyromegaly Chest: CTA bilaterally, no wheezes, crackles, or other adventitious sounds CV: RRR, no m/g/c/r Abd: soft, NT, + distended but not tight, +BS in all four quadrants; no HSM, guarding, ridigity, or rebound tenderness Ext: 2+  edema, well perfused with 2+ pulses, Skin: no rash or lesions noted Lymph: no LAD  Data: Lab Results  Component Value Date   WBC 8.5 01/17/2015   HGB 11.6* 01/17/2015   HCT 34.3* 01/17/2015   MCV 103.5* 01/17/2015   PLT 221 01/17/2015    Recent Labs Lab 01/11/15 0453 01/15/15 2359 01/17/15 0523  HGB 11.7* 12.0* 11.6*   Lab Results  Component Value Date   NA 136 01/17/2015   K 3.4* 01/17/2015   CL 104 01/17/2015   CO2 24 01/17/2015   BUN 76* 01/17/2015   CREATININE 4.08* 01/17/2015   Lab Results  Component Value Date   ALT  21 01/15/2015   AST 52* 01/15/2015   ALKPHOS 107 01/15/2015   BILITOT 1.0 01/15/2015    Recent Labs Lab 01/17/15 0523  INR 2.17   Assessment/Plan: Blake White is a 81 y.o. male with cdiff diarhea which is now resolved, also with abd and LE swelling.  He appears to have cirrrhosis on u/s but also dilated IVC so could be more congestive hepatopathy or cardiac cirrhosis.   Recommendations: - low salt diet - cont diuresis - for outpt regimen given possible cirrhosis and ascites, would add spiro to lasix for diuresis and keep in ratio 100 mg spiro to 40 mg lasix daily.  - f/u GI clinic regarding possible cirrhosis - complete 14 days course of PO vanco  Please call with questions or concerns.  Averi Cacioppo, Addison Naegeli, MD

## 2015-01-17 NOTE — Progress Notes (Signed)
[facility] - [place] at [facility]   PATIENT NAME: Blake White    MR#:  [id number]  DATE OF BIRTH:  1933-09-12  SUBJECTIVE:  CHIEF COMPLAINT:  Patient's shortness of breath is slightly better. Diarrhea is improving. Concerned about abdominal distention .Gettting BLLE wound care with UNA normal. No history of hepatitis  REVIEW OF SYSTEMS:  CONSTITUTIONAL: No fever, reporting fatigue  EYES: No blurred or double vision.  EARS, NOSE, AND THROAT: No tinnitus or ear pain.  RESPIRATORY: No cough, reporting exetrional shortness of breath, wheezing or hemoptysis.  CARDIOVASCULAR: No chest pain, orthopnea, edema.  GASTROINTESTINAL: Reporting abdominal distention and diarrhea .No nausea, vomiting,or abdominal pain.  GENITOURINARY: No dysuria, hematuria.  ENDOCRINE: No polyuria, nocturia,  HEMATOLOGY: No anemia, easy bruising or bleeding SKIN: No rash or lesion. MUSCULOSKELETAL: No joint pain or arthritis.  Lower extremity swelling NEUROLOGIC: No tingling, numbness, weakness.  PSYCHIATRY: No anxiety or depression.   DRUG ALLERGIES:  No Known Allergies  VITALS:  Blood pressure 103/59, pulse 57, temperature 98.1 F (36.7 C), temperature source Oral, resp. rate 20, height 5\' 8"  (1.727 m), weight 108.636 kg (239 lb 8 oz), SpO2 98 %.  PHYSICAL EXAMINATION:  GENERAL:  81 y.o.-year-old patient lying in the bed with no acute distress.  EYES: Pupils equal, round, reactive to light and accommodation. No scleral icterus. Extraocular muscles intact.  HEENT: Head atraumatic, normocephalic. Oropharynx and nasopharynx clear.  NECK:  Supple, no jugular venous distention. No thyroid enlargement, no tenderness.  LUNGS: Moderate air entry , no wheezing, diffuse rales,rhonchi or crepitation. No use of accessory muscles of respiration.  CARDIOVASCULAR: Irregularly irregular. No murmurs, rubs, or gallops.  ABDOMEN: Soft, nontender, distended. Bowel sounds present. No organomegaly or  mass.  EXTREMITIES: Pitting 3+ pedal edema, cyanosis, or clubbing.  NEUROLOGIC: Cranial nerves II through XII are intact. Muscle strength 5/5 in all extremities. Sensation intact. Gait not checked.  PSYCHIATRIC: The patient is alert and oriented x 3.  SKIN: No obvious rash, lesion, or ulcer.    LABORATORY PANEL:   CBC  Recent Labs Lab 01/17/15 0523  WBC 8.5  HGB 11.6*  HCT 34.3*  PLT 221   ------------------------------------------------------------------------------------------------------------------  Chemistries   Recent Labs Lab 01/15/15 2359 01/17/15 0523  NA 134* 136  K 3.6 3.4*  CL 103 104  CO2 24 24  GLUCOSE 119* 99  BUN 69* 76*  CREATININE 3.97* 4.08*  CALCIUM 8.6* 8.5*  AST 52*  --   ALT 21  --   ALKPHOS 107  --   BILITOT 1.0  --    ------------------------------------------------------------------------------------------------------------------  Cardiac Enzymes  Recent Labs Lab 01/11/15 1600  TROPONINI 0.04*   ------------------------------------------------------------------------------------------------------------------  RADIOLOGY:  Dg Chest 1 View  01/16/2015   CLINICAL DATA:  Weakness and leukocytosis. White cell count 19,000. History of congestive heart failure and atrial fibrillation.  EXAM: CHEST  1 VIEW  COMPARISON:  None.  FINDINGS: Postoperative changes in the mediastinum. Shallow inspiration. Cardiac enlargement with mild pulmonary vascular congestion. No edema or consolidation. Slight blunting of left costophrenic angle suggesting a small effusion. No pneumothorax. Mediastinal contours appear intact.  IMPRESSION: Mild cardiac enlargement and mild pulmonary vascular congestion without edema or consolidation. Probable small left pleural effusion.   Electronically Signed   By: Burman Nieves M.D.   On: 01/16/2015 02:09   US Abdomen Complete  01/17/2015   CLINICAL DATA:  Abdominal distention and pain  EXAM: ULTRASOUND ABDOMEN COMPLETE   COMPARISON:  01/11/2015 similar exam  FINDINGS: Gallbladder:  Surgically absent  Common bile duct: Diameter: 4 mm  Liver: Nodular contour with increased in echogenicity suggesting cirrhosis as seen previously. No focal abnormality or intrahepatic ductal dilatation. Perihepatic ascites is present.  IVC: No abnormality visualized.  Pancreas: Partly obscured by bowel gas, normal in visualized portions.  Spleen: Size and appearance within normal limits.  Right Kidney: Length: 8.8 cm. Echogenicity within normal limits. No mass or hydronephrosis visualized.  Left Kidney: Length: 8.9 cm, with 2 closely opposed cysts left upper pole reidentified measuring 2.8 x 2.3 x 2.3 cm. Echogenicity within normal limits. No mass or hydronephrosis visualized.  Mildly lobulated renal contour bilaterally.  Abdominal aorta: No aneurysm visualized in the proximal aorta. Aorta otherwise obscured by bowel gas.  Other findings: Pleural effusions and ascites  IMPRESSION: Stable appearance of nodular hepatic contour and increased echogenicity suggesting cirrhosis.  Ascites and pleural effusions.   Electronically Signed   By: Christiana Pellant M.D.   On: 01/17/2015 09:43    EKG:   Orders placed or performed during the hospital encounter of 01/15/15  . EKG 12-Lead  . EKG 12-Lead    ASSESSMENT AND PLAN:   Assessment/Plan This is a 81 year old Caucasian male with past medical history significant for heart failure and chronic kidney disease admitted for severe C. difficile colitis and worsening renal failure. 1. C. difficile colitis:  D/Ced flagyl as clinically not better and continue oral vancomycin 250 mg for 2 weeks total   Abdominal US with liver cirrhosis etiology unclear Will get hepatitis panel, will discuss with gastroenterology. Patient is on Coumadin and INR is at 2.17. Will consider diagnostic paracentesis, will discuss with gastro-  on enteric precautions Appreciate GI recs  2. Acute on chronic kidney disease stage IV:  Worsening of CKD stage IV sec to prerenal and cardiorenal syn .  Baseline cr is 2.4 and sees [facility] nephrology -d/c PO diuretics - lasix, HCTZ and metolazone  We will avoid nephrotoxic agents  Appreciate  nephrology recommendations.   3.Acute on chronic systolic congestive heart failure: 30-35% ejection fraction on echocardiogram Strict intake and output Daily weight monitoring Fluid restriction Continue Lasix 40 mg IV every 12 hours and monitor renal function closely  4. A. fib: Rate controlled. Continue amiodarone and coumadin management per pharmacy INR is therapeutic at 2.17  5. Hypothyroidism: Continue Synthroid  6. PVD - Neomia Dear /wound care  6. DVT prophylaxis: as above      All the records are reviewed and case discussed with Care Management/Social Workerr. Management plans discussed with the patient, family and they are in agreement.  CODE STATUS: full code  TOTAL TIME TAKING CARE OF THIS PATIENT: 35 minutes.   POSSIBLE D/C IN 2-3 DAYS, DEPENDING ON CLINICAL CONDITION.   Ramonita Lab M.D on 01/17/2015 at 12:51 PM  Between 7am to 6pm - Pager - [phone] After 6pm go to [email or web address] - password [email or web address] [facility]  [place] [facility]  Office  [phone]  CC: Primary care physician; Sula Rumple, MD

## 2015-01-17 NOTE — Plan of Care (Signed)
Individualization of Care Pt prefers to be called Blake White Hx of CHF, BPH, A-fib, thyroid disease, HLD  Problem: Discharge Progression Outcomes Goal: Other Discharge Outcomes/Goals Outcome: Progressing  Plan of care progress to goal: Pain:  Patient without complaints of pain this shift. Hemodynamically.  Patient afebrile and VSS this shift.  BP remains slightly low - MD aware. IVF stopped - Lasix given this shift per eMAR. BP 107/62 mmHg  Pulse 56  Temp(Src) 97.6 F (36.4 C) (Oral)  Resp 17  Ht  (1.727 m)  Wt 242 lb (109.77 kg)  BMI 36.80 kg/m2  SpO2 99% Complications:  Patient remains on enteric precautions for C. Dif.  No stools this shift. Diet:  Patient on fluid restriction diet and has been NPO since midnight for U/S of abdomen and kidneys today. Activity:  Patient in bed this shift.  Will call out for needs and assistance.

## 2015-01-17 NOTE — Progress Notes (Signed)
[facility]  ROUNDING NOTE   Subjective:   Family at bedside.  Creatinine 4.08 (3.97) UOP recorded at 468m  Objective:  Vital signs in last 24 hours:  Temp:  [97.6 F (36.4 C)-98.1 F (36.7 C)] 98.1 F (36.7 C) (09/11 0452) Pulse Rate:  [56-59] 57 (09/11 0452) Resp:  [17-20] 20 (09/11 0452) BP: (88-107)/(50-62) 103/59 mmHg (09/11 0452) SpO2:  [98 %-99 %] 98 % (09/11 0452) Weight:  [108.636 kg (239 lb 8 oz)] 108.636 kg (239 lb 8 oz) (09/11 0500)  Weight change: -0.68 kg (-1 lb 8 oz) Filed Weights   01/15/15 2240 01/16/15 0353 01/17/15 0500  Weight: 109.317 kg (241 lb) 109.77 kg (242 lb) 108.636 kg (239 lb 8 oz)    Intake/Output: I/O last 3 completed shifts: In: 190 [I.V.:190] Out: 800 [Urine:800]   Intake/Output this shift:  Total I/O In: -  Out: 250 [Urine:250]  Physical Exam: General: NAD, sitting up in bed  Head: Normocephalic, atraumatic. Moist oral mucosal membranes  Eyes: Anicteric, PERRL  Neck: Supple, trachea midline  Lungs:  Clear to auscultation  Heart: Regular rate and rhythm  Abdomen:  Soft, nontender, +abdominal wall edema  Extremities:  3+ peripheral edema.  Neurologic: Nonfocal, moving all four extremities  Skin: No lesions  GU: +scrotal edema    Basic Metabolic Panel:  Recent Labs Lab 01/10/15 1834 01/11/15 0453 01/12/15 0321 01/15/15 2359 01/17/15 0523  NA 134* 134* 134* 134* 136  K 3.0* 3.8 3.9 3.6 3.4*  CL 99* 103 104 103 104  CO2 _0 GLUCOSE 98 95 111* 119* 99  BUN 62* 60* 59* 69* 76*  CREATININE 2.99* 2.84* 2.96* 3.97* 4.08*  CALCIUM 8.6* 8.3* 8.2* 8.6* 8.5*    Liver Function Tests:  Recent Labs Lab 01/10/15 1834 01/11/15 0453 01/15/15 2359  AST 53* 47* 52*  ALT _1 ALKPHOS 83 75 107  BILITOT 1.3* 1.6* 1.0  PROT 5.4* 5.0* 5.1*  ALBUMIN 2.3* 2.2* 2.2*    Recent Labs Lab 01/10/15 1834  LIPASE 16*   No results for input(s): AMMONIA in the last 168 hours.  CBC:  Recent  Labs Lab 01/10/15 1834 01/11/15 0453 01/15/15 2359 01/17/15 0523  WBC 13.6* 15.1* 19.2* 8.5  NEUTROABS  --   --  17.1*  --   HGB 12.5* 11.7* 12.0* 11.6*  HCT 37.0* 35.3* 36.4* 34.3*  MCV 102.4* 102.4* 103.9* 103.5*  PLT 171 165 217 221    Cardiac Enzymes:  Recent Labs Lab 01/10/15 1834 01/10/15 2304 01/11/15 0453 01/11/15 1130 01/11/15 1600  TROPONINI 0.29* 0.03 0.04* 0.80* 0.04*    BNP: Invalid input(s): POCBNP  CBG:  Recent Labs Lab 01/10/15 1826  GLUCAP 964   Microbiology: Results for orders placed or performed during the hospital encounter of 01/10/15  Stool culture     Status: None   Collection Time: 01/11/15  9:25 AM  Result Value Ref Range Status   Specimen Description STOOL  Final   Special Requests NONE  Final   Culture   Final    NO SALMONELLA OR SHIGELLA ISOLATED No Pathogenic E. coli detected NO CAMPYLOBACTER DETECTED    Report Status 01/15/2015 FINAL  Final  C difficile quick scan w PCR reflex     Status: Abnormal   Collection Time: 01/11/15  9:25 AM  Result Value Ref Range Status   C Diff antigen POSITIVE (A) NEGATIVE Final   C Diff toxin POSITIVE (A) NEGATIVE Final  C Diff interpretation   Final    Positive for toxigenic C. difficile, active toxin production present.    Comment: DOLL FERGUSON AT 0340 ON 01/11/15 BY [facility] CRITICAL RESULT CALLED TO, READ BACK BY AND VERIFIED WITH:     Coagulation Studies:  Recent Labs  01/15/15 2359 01/17/15 0523  LABPROT 25.6* 24.3*  INR 2.32 2.17    Urinalysis:  Recent Labs  01/16/15 1316  COLORURINE YELLOW*  LABSPEC 1.005  PHURINE 5.0  GLUCOSEU NEGATIVE  HGBUR NEGATIVE  BILIRUBINUR NEGATIVE  KETONESUR NEGATIVE  PROTEINUR NEGATIVE  NITRITE NEGATIVE  LEUKOCYTESUR NEGATIVE      Imaging: Dg Chest 1 View  01/16/2015   CLINICAL DATA:  Weakness and leukocytosis. White cell count 19,000. History of congestive heart failure and atrial fibrillation.  EXAM: CHEST  1 VIEW  COMPARISON:  None.   FINDINGS: Postoperative changes in the mediastinum. Shallow inspiration. Cardiac enlargement with mild pulmonary vascular congestion. No edema or consolidation. Slight blunting of left costophrenic angle suggesting a small effusion. No pneumothorax. Mediastinal contours appear intact.  IMPRESSION: Mild cardiac enlargement and mild pulmonary vascular congestion without edema or consolidation. Probable small left pleural effusion.   Electronically Signed   By: Lucienne Capers M.D.   On: 01/16/2015 02:09   US Abdomen Complete  01/17/2015   CLINICAL DATA:  Abdominal distention and pain  EXAM: ULTRASOUND ABDOMEN COMPLETE  COMPARISON:  01/11/2015 similar exam  FINDINGS: Gallbladder: Surgically absent  Common bile duct: Diameter: 4 mm  Liver: Nodular contour with increased in echogenicity suggesting cirrhosis as seen previously. No focal abnormality or intrahepatic ductal dilatation. Perihepatic ascites is present.  IVC: No abnormality visualized.  Pancreas: Partly obscured by bowel gas, normal in visualized portions.  Spleen: Size and appearance within normal limits.  Right Kidney: Length: 8.8 cm. Echogenicity within normal limits. No mass or hydronephrosis visualized.  Left Kidney: Length: 8.9 cm, with 2 closely opposed cysts left upper pole reidentified measuring 2.8 x 2.3 x 2.3 cm. Echogenicity within normal limits. No mass or hydronephrosis visualized.  Mildly lobulated renal contour bilaterally.  Abdominal aorta: No aneurysm visualized in the proximal aorta. Aorta otherwise obscured by bowel gas.  Other findings: Pleural effusions and ascites  IMPRESSION: Stable appearance of nodular hepatic contour and increased echogenicity suggesting cirrhosis.  Ascites and pleural effusions.   Electronically Signed   By: Conchita Paris M.D.   On: 01/17/2015 09:43     Medications:     . allopurinol  150 mg Oral Daily  . amiodarone  200 mg Oral Daily  . aspirin EC  81 mg Oral Daily  . cholecalciferol  1,000 Units  Oral Daily  . furosemide  40 mg Intravenous Q12H  . levothyroxine  150 mcg Oral QAC breakfast  . metroNIDAZOLE  500 mg Oral 3 times per day  . potassium chloride SA  20 mEq Oral BID  . simvastatin  40 mg Oral QHS  . sodium chloride  3 mL Intravenous Q12H  . vancomycin  250 mg Oral 4 times per day  . warfarin  3 mg Oral QPM   acetaminophen **OR** acetaminophen, morphine injection, ondansetron **OR** ondansetron (ZOFRAN) IV  Assessment/ Plan:  Mr. Blake White is a 81 y.o. white male with congestive heart failure, atrial fibrillation, hypothyroidism, BPH, hyperlipidemia, hypertension, coronary artery disease status post CABG, who was admitted to [facility] on 01/15/2015 for C. Diff colitis.   1. Acute kidney failure on chronic kidney disease stage IV 2. Acute exacerbation of congestive heart failure:  3. Hypertension 4. Edema: generalized. 5. Hypokalemia  Plan: Acute renal failure seems to be multifactorial with prerenal azotemia and now with acute cardiorenal syndrome.  Chronic kidney disease stage IV followed by [facility] Nephrology. Baseline creatinine of 2.4, eGFR of 26. - Continue IV furosemide 31m q12. Responding well.  - Renal ultrasound reviewed with patient and family.  - Echocardiogram pending - Continue Fluid restriction   LOS: 1[place]9/11/201611:02 AM

## 2015-01-17 NOTE — Progress Notes (Signed)
ANTICOAGULATION CONSULT NOTE - Initial Consult  Pharmacy Consult for warfarin dosing and monitoring Indication: atrial fibrillation  No Known Allergies  Patient Measurements: Height:  (172.7 cm) Weight: 239 lb 8 oz (108.636 kg) IBW/kg (Calculated) : 68.4   Vital Signs: Temp: 98.1 F (36.7 C) (09/11 0452) Temp Source: Oral (09/11 0452) BP: 103/59 mmHg (09/11 0452) Pulse Rate: 57 (09/11 0452)  Labs:  Recent Labs  01/15/15 2359 01/17/15 0523  HGB 12.0* 11.6*  HCT 36.4* 34.3*  PLT 217 221  LABPROT 25.6* 24.3*  INR 2.32 2.17  CREATININE 3.97* 4.08*    Estimated Creatinine Clearance: 17.5 mL/min (by C-G formula based on Cr of 4.08).   Medical History: Past Medical History  Diagnosis Date  . CHF (congestive heart failure)   . BPH (benign prostatic hyperplasia)   . A-fib   . Thyroid disease   . High cholesterol     Medications:  Prescriptions prior to admission  Medication Sig Dispense Refill Last Dose  . allopurinol (ZYLOPRIM) 300 MG tablet Take 150 mg by mouth daily.  11 01/15/2015 at Unknown time  . amiodarone (PACERONE) 200 MG tablet Take 200 mg by mouth daily.  1 01/15/2015 at Unknown time  . aspirin EC 81 MG tablet Take 81 mg by mouth daily.   01/15/2015 at Unknown time  . Cholecalciferol (VITAMIN D3) 1000 UNITS CAPS Take 1,000 Units by mouth daily.   01/15/2015 at Unknown time  . furosemide (LASIX) 40 MG tablet Take 40 mg by mouth daily.  2 01/15/2015 at Unknown time  . hydrochlorothiazide (HYDRODIURIL) 25 MG tablet Take 25 mg by mouth daily.  1 01/15/2015 at Unknown time  . levothyroxine (SYNTHROID, LEVOTHROID) 150 MCG tablet Take 150 mcg by mouth daily before breakfast.  1 01/15/2015 at Unknown time  . metolazone (ZAROXOLYN) 5 MG tablet Take 5 mg by mouth daily.   01/15/2015 at Unknown time  . metroNIDAZOLE (FLAGYL) 500 MG tablet Take 1 tablet (500 mg total) by mouth every 8 (eight) hours. 24 tablet 0 01/15/2015 at 1630  . potassium chloride SA (K-DUR,KLOR-CON) 20 MEQ  tablet Take 20 mEq by mouth 2 (two) times daily.  6 01/15/2015 at Unknown time  . saccharomyces boulardii (FLORASTOR) 250 MG capsule Take 1 capsule (250 mg total) by mouth 2 (two) times daily. 20 capsule 0 01/15/2015 at Unknown time  . simvastatin (ZOCOR) 40 MG tablet Take 40 mg by mouth at bedtime.  1 01/14/2015 at Unknown time  . warfarin (COUMADIN) 3 MG tablet Take 1 tablet (3 mg total) by mouth every evening. 30 tablet 1 01/14/2015 at Unknown time    Assessment: Patient is a 82yo male who has a PMH of A.Fib, C.Diff, CKD, hypothyroidism and CHF . Patient was controlled on warfarin  for A.Fib prior to hospital admission. He is currently on metronidazole regimen for positive C. Diff toxin and positive C.diff Antigen. Pharmacy has been consulted to dose and monitor warfarin. INR 2.32, PT 25.6, and hgb 12. Pharmacy is aware metronidazole may cause an increase in patients INR.   Goal of Therapy:  INR 2-3    Plan:  Will continue patients home dose of  daily. PT-INR ordered for 0500 tomorrow. Pharmacy will continue to monitor patients INR daily and adjust warfarin as needed.   9/11 INR 2.17. Continue current regimen. INR in AM.  Fulton Reek, PharmD, BCPS  01/17/2015

## 2015-01-17 NOTE — Plan of Care (Signed)
Problem: Discharge Progression Outcomes Goal: Other Discharge Outcomes/Goals Outcome: Progressing Patient has no complaints of pain. VSS. Tolerating diet without nausea. Up to bathroom with 1 assist. Family at bedside.

## 2015-01-18 LAB — PROTIME-INR
INR: 2.19
Prothrombin Time: 24.5 seconds — ABNORMAL HIGH (ref 11.4–15.0)

## 2015-01-18 LAB — COMPREHENSIVE METABOLIC PANEL
ALK PHOS: 107 U/L (ref 38–126)
ALT: 20 U/L (ref 17–63)
ANION GAP: 9 (ref 5–15)
AST: 51 U/L — ABNORMAL HIGH (ref 15–41)
Albumin: 2.1 g/dL — ABNORMAL LOW (ref 3.5–5.0)
BILIRUBIN TOTAL: 0.6 mg/dL (ref 0.3–1.2)
BUN: 79 mg/dL — ABNORMAL HIGH (ref 6–20)
CALCIUM: 8.6 mg/dL — AB (ref 8.9–10.3)
CO2: 25 mmol/L (ref 22–32)
Chloride: 102 mmol/L (ref 101–111)
Creatinine, Ser: 3.58 mg/dL — ABNORMAL HIGH (ref 0.61–1.24)
GFR calc non Af Amer: 15 mL/min — ABNORMAL LOW (ref >60)
GFR, EST AFRICAN AMERICAN: 17 mL/min — AB (ref >60)
Glucose, Bld: 90 mg/dL (ref 65–99)
Potassium: 3.1 mmol/L — ABNORMAL LOW (ref 3.5–5.1)
SODIUM: 136 mmol/L (ref 135–145)
TOTAL PROTEIN: 4.6 g/dL — AB (ref 6.5–8.1)

## 2015-01-18 LAB — HEPATITIS C ANTIBODY

## 2015-01-18 LAB — HEPATITIS B SURFACE ANTIGEN: HEP B S AG: NEGATIVE

## 2015-01-18 MED ORDER — SPIRONOLACTONE 25 MG PO TABS
100.0000 mg | ORAL_TABLET | Freq: Every day | ORAL | Status: DC
  Administered 2015-01-18 – 2015-01-20 (×3): 100 mg via ORAL
  Filled 2015-01-18 (×3): qty 4

## 2015-01-18 MED ORDER — POTASSIUM CHLORIDE 20 MEQ PO PACK
40.0000 meq | PACK | Freq: Once | ORAL | Status: AC
  Administered 2015-01-18: 12:00:00 40 meq via ORAL
  Filled 2015-01-18: qty 2

## 2015-01-18 NOTE — Progress Notes (Signed)
GI Inpatient Follow-up Note  Patient Identification: Blake White is a 81 y.o. male with c.diff diarrhea and likely cirrhosis.   Subjective:  No more diarrhea. Abd and LE swelling still improving.  NO sob. NO f/c, no n/v.   No rectal bleeding or melena.   Scheduled Inpatient Medications:  . allopurinol  150 mg Oral Daily  . amiodarone  200 mg Oral Daily  . aspirin EC  81 mg Oral Daily  . cholecalciferol  1,000 Units Oral Daily  . furosemide  40 mg Intravenous Q12H  . levothyroxine  150 mcg Oral QAC breakfast  . potassium chloride SA  20 mEq Oral BID  . simvastatin  40 mg Oral QHS  . sodium chloride  3 mL Intravenous Q12H  . spironolactone  100 mg Oral Daily  . vancomycin  250 mg Oral 4 times per day  . warfarin  3 mg Oral QPM    Continuous Inpatient Infusions:     PRN Inpatient Medications:  acetaminophen **OR** acetaminophen, morphine injection, ondansetron **OR** ondansetron (ZOFRAN) IV    Physical Examination: BP 99/53 mmHg  Pulse 61  Temp(Src) 97.6 F (36.4 C) (Oral)  Resp 18  Ht  (1.727 m)  Wt 108.773 kg (239 lb 12.8 oz)  BMI 36.47 kg/m2  SpO2 100% Gen: NAD, alert and oriented x 4  Neck: supple, no JVD or thyromegaly Chest: CTA bilaterally, no wheezes, crackles, or other adventitious sounds CV: RRR, no m/g/c/r Abd: soft, NT, ND, +BS in all four quadrants; no HSM, guarding, ridigity, or rebound tenderness Ext: 2+ edema bilat, well perfused with 2+ pulses, Skin: no rash or lesions noted Lymph: no LAD  Data: Lab Results  Component Value Date   WBC 8.5 01/17/2015   HGB 11.6* 01/17/2015   HCT 34.3* 01/17/2015   MCV 103.5* 01/17/2015   PLT 221 01/17/2015    Recent Labs Lab 01/15/15 2359 01/17/15 0523  HGB 12.0* 11.6*   Lab Results  Component Value Date   NA 136 01/18/2015   K 3.1* 01/18/2015   CL 102 01/18/2015   CO2 25 01/18/2015   BUN 79* 01/18/2015   CREATININE 3.58* 01/18/2015   Lab Results  Component Value Date   ALT 20  01/18/2015   AST 51* 01/18/2015   ALKPHOS 107 01/18/2015   BILITOT 0.6 01/18/2015    Recent Labs Lab 01/18/15 0456  INR 2.19   Assessment/Plan: Mr. Baeten is a 81 y.o. male with c.diff diarrhea which is now resolved.  Also has likely cirrhosis with small amt ascites on u/s but likely at least in part cardiac cirrhosis.   Recommendations: - complete 14 day course of PO vanco - GI clinic to f/u cirrhosis in 2 weeks - low Na diet - lasix and spiro per primary team and cards.   Please call with questions or concerns.  Onofrio Klemp, Addison Naegeli, MD

## 2015-01-18 NOTE — Evaluation (Signed)
Physical Therapy Evaluation Patient Details Name: Blake White MRN: [id number] DOB: 17-Jul-1933 Today's Date: 01/18/2015   History of Present Illness  Pt presents with generalized weakeness due to acute kidney injury and c-diff  Clinical Impression  Upon evaluation, pt presents with a fair ability to perform functional mobility concerns still remain about his functional endurance and safety of mobility in household/community settings. Pt demonstrated independence with bed mobility tasks and transfers (sit<>stand), as PT provided supervision. Pt was CGA for ambulation x 15 ft w/ RW and was able to stand unsupported to use the bathroom demonstrating his ability to maintain unsupported static balance. Pt was able to complete a 5XSTS test today; with a time of 22 seconds, demonstrating the patient is still at an increased risk for falls and has functional strength deficits. Pt will benefit from skilled PT services in order to increase functional endurance and strength while still admitted. Pt could also benefit from HHPT upon d/c but will reassess this decision based on progress he demonstrates.     Follow Up Recommendations Home health PT    Equipment Recommendations  Rolling walker with 5" wheels    Recommendations for Other Services       Precautions / Restrictions Precautions Precautions: Fall Restrictions Weight Bearing Restrictions: No      Mobility  Bed Mobility Overal bed mobility: Independent             General bed mobility comments:  (demonstrated a supine to side lying roll using bed rail for assistance but otherwise independent, also able to scoot up in bed independently)  Transfers Overall transfer level: Independent Equipment used: Rolling walker (2 wheeled)                Ambulation/Gait Ambulation/Gait assistance: Modified independent (Device/Increase time) Ambulation Distance (Feet): 15 Feet Assistive device: Rolling walker (2 wheeled) Gait  Pattern/deviations: Trunk flexed;Decreased stride length;Decreased step length - left;Decreased step length - right;Step-through pattern   Gait velocity interpretation: Below normal speed for age/gender    Stairs            Wheelchair Mobility    Modified Rankin (Stroke Patients Only)       Balance Overall balance assessment: Modified Independent                               Standardized Balance Assessment Standardized Balance Assessment :  (5 x STS: 22 seconds (used a walker for assist with standing))           Pertinent Vitals/Pain Pain Assessment: No/denies pain    Home Living Family/patient expects to be discharged to:: Private residence Living Arrangements: Spouse/significant other;Children Available Help at Discharge: Family Type of Home: House Home Access: Stairs to enter Entrance Stairs-Rails: None (son is working on Publishing rights manager a Administrator, arts for them) Secretary/administrator of Steps: 2 Home Layout: One level Home Equipment: Environmental consultant - 2 wheels;Bedside commode;Shower seat      Prior Function Level of Independence: Independent with assistive device(s)         Comments: used a walker in the house and a walking stick for community ambulation     Hand Dominance        Extremity/Trunk Assessment   Upper Extremity Assessment: Overall WFL for tasks assessed           Lower Extremity Assessment: Overall WFL for tasks assessed         Communication   Communication: No difficulties  Cognition Arousal/Alertness: Awake/alert Behavior During Therapy: WFL for tasks assessed/performed Overall Cognitive Status: Within Functional Limits for tasks assessed                      General Comments General comments (skin integrity, edema, etc.):  (pt states that he gets ulcers on his LEs that leak fluid; unable to assess today due to LEs being heavily wrapped with bandages)    Exercises General Exercises - Upper Extremity Shoulder  Flexion: AROM (WNL) General Exercises - Lower Extremity Long Arc Quad: AROM (WNL)      Assessment/Plan    PT Assessment Patient needs continued PT services  PT Diagnosis Difficulty walking;Generalized weakness;Abnormality of gait   PT Problem List Decreased strength;Decreased balance;Decreased mobility;Decreased range of motion (decreased ROM of B LEs due to swelling)  PT Treatment Interventions Gait training;Stair training;Functional mobility training;Therapeutic activities;Therapeutic exercise;Balance training;Patient/family education;DME instruction   PT Goals (Current goals can be found in the Care Plan section) Acute Rehab PT Goals Patient Stated Goal: "to get his strength back" PT Goal Formulation: With patient Time For Goal Achievement: 02/01/15 Potential to Achieve Goals: Fair    Frequency Min 2X/week   Barriers to discharge        Co-evaluation               End of Session Equipment Utilized During Treatment: Gait belt Activity Tolerance: Patient tolerated treatment well;No increased pain Patient left: in bed;with call bell/phone within reach;with bed alarm set;with family/visitor present           Time: 1510-1540 PT Time Calculation (min) (ACUTE ONLY): 30 min   Charges:         PT G Codes:        Blake White, SPT 01/18/2015, 4:14 PM

## 2015-01-18 NOTE — Progress Notes (Signed)
ANTICOAGULATION CONSULT NOTE - Initial Consult  Pharmacy Consult for warfarin dosing and monitoring Indication: atrial fibrillation  No Known Allergies  Patient Measurements: Height:  (172.7 cm) Weight: 239 lb 12.8 oz (108.773 kg) IBW/kg (Calculated) : 68.4   Vital Signs: Temp: 97.3 F (36.3 C) (09/12 0525) Temp Source: Axillary (09/12 0525) BP: 95/52 mmHg (09/12 0525) Pulse Rate: 54 (09/12 0525)  Labs:  Recent Labs  01/15/15 2359 01/17/15 0523 01/18/15 0456  HGB 12.0* 11.6*  --   HCT 36.4* 34.3*  --   PLT 217 221  --   LABPROT 25.6* 24.3* 24.5*  INR 2.32 2.17 2.19  CREATININE 3.97* 4.08* 3.58*    Estimated Creatinine Clearance: 20 mL/min (by C-G formula based on Cr of 3.58).   Medical History: Past Medical History  Diagnosis Date  . CHF (congestive heart failure)   . BPH (benign prostatic hyperplasia)   . A-fib   . Thyroid disease   . High cholesterol     Medications:  Prescriptions prior to admission  Medication Sig Dispense Refill Last Dose  . allopurinol (ZYLOPRIM) 300 MG tablet Take 150 mg by mouth daily.  11 01/15/2015 at Unknown time  . amiodarone (PACERONE) 200 MG tablet Take 200 mg by mouth daily.  1 01/15/2015 at Unknown time  . aspirin EC 81 MG tablet Take 81 mg by mouth daily.   01/15/2015 at Unknown time  . Cholecalciferol (VITAMIN D3) 1000 UNITS CAPS Take 1,000 Units by mouth daily.   01/15/2015 at Unknown time  . furosemide (LASIX) 40 MG tablet Take 40 mg by mouth daily.  2 01/15/2015 at Unknown time  . hydrochlorothiazide (HYDRODIURIL) 25 MG tablet Take 25 mg by mouth daily.  1 01/15/2015 at Unknown time  . levothyroxine (SYNTHROID, LEVOTHROID) 150 MCG tablet Take 150 mcg by mouth daily before breakfast.  1 01/15/2015 at Unknown time  . metolazone (ZAROXOLYN) 5 MG tablet Take 5 mg by mouth daily.   01/15/2015 at Unknown time  . metroNIDAZOLE (FLAGYL) 500 MG tablet Take 1 tablet (500 mg total) by mouth every 8 (eight) hours. 24 tablet 0 01/15/2015 at 1630   . potassium chloride SA (K-DUR,KLOR-CON) 20 MEQ tablet Take 20 mEq by mouth 2 (two) times daily.  6 01/15/2015 at Unknown time  . saccharomyces boulardii (FLORASTOR) 250 MG capsule Take 1 capsule (250 mg total) by mouth 2 (two) times daily. 20 capsule 0 01/15/2015 at Unknown time  . simvastatin (ZOCOR) 40 MG tablet Take 40 mg by mouth at bedtime.  1 01/14/2015 at Unknown time  . warfarin (COUMADIN) 3 MG tablet Take 1 tablet (3 mg total) by mouth every evening. 30 tablet 1 01/14/2015 at Unknown time    Assessment: Patient is a 81yo male who has a PMH of A.Fib, C.Diff, CKD, hypothyroidism and CHF . Patient was controlled on warfarin  for A.Fib prior to hospital admission. He is currently on metronidazole regimen for positive C. Diff toxin and positive C.diff Antigen. Pharmacy has been consulted to dose and monitor warfarin. INR 2.32, PT 25.6, and hgb 12. Pharmacy is aware metronidazole may cause an increase in patients INR.   Goal of Therapy:  INR 2-3    Plan:  Will continue patients home dose of  daily. PT-INR ordered for 0500 tomorrow. Pharmacy will continue to monitor patients INR daily and adjust warfarin as needed.   9/11 INR 2.17. Continue current regimen. INR in AM.  9/12 AM INR 2.19. Continue current regimen. Recheck in AM.  Fulton Reek,  PharmD, BCPS  01/18/2015

## 2015-01-18 NOTE — Plan of Care (Signed)
Problem: Discharge Progression Outcomes Goal: Other Discharge Outcomes/Goals Outcome: Progressing Continues on C Diff precautions. No complaints of pain. Up to chair. Tolerating diet, no complaints. VSS.

## 2015-01-18 NOTE — Consult Note (Signed)
[facility] Cardiology  CARDIOLOGY CONSULT NOTE  [name] MRN: [id number] DOB/AGE: Nov 15, 1933 81 y.o.  Admit date: 01/15/2015 Referring Physician Gouru Primary Physician Virk Primary Cardiologist Fath Reason for Consultation congestive heart failure  HPI: The patient is a 81 year old gentleman with known history of coronary artery disease, ischemic cardiomyopathy, chronic kidney disease, referred for evaluation of congestive heart failure. The patient was recently discharged 4 days ago following hospitalization for Clostridium difficile colitis, with dehydration and worsening renal function. Following discharge, the patient reports he's been experiencing fluid retention without chest pain or worsening shortness of breath. Patient presented to [facility] emergency room, with admission labs notable for elevated BUN and creatinine of 76 and 4.08, respectively. 2-D echocardiogram was performed 01/17/15 which revealed LV ejection fraction of 30-35%.  Review of systems complete and found to be negative unless listed above     Past Medical History  Diagnosis Date  . CHF (congestive heart failure)   . BPH (benign prostatic hyperplasia)   . A-fib   . Thyroid disease   . High cholesterol     Past Surgical History  Procedure Laterality Date  . Vein bypass surgery    . Vascular surgery    . Cardiac surgery      quad bypass    Prescriptions prior to admission  Medication Sig Dispense Refill Last Dose  . allopurinol (ZYLOPRIM) 300 MG tablet Take 150 mg by mouth daily.  11 01/15/2015 at Unknown time  . amiodarone (PACERONE) 200 MG tablet Take 200 mg by mouth daily.  1 01/15/2015 at Unknown time  . aspirin EC 81 MG tablet Take 81 mg by mouth daily.   01/15/2015 at Unknown time  . Cholecalciferol (VITAMIN D3) 1000 UNITS CAPS Take 1,000 Units by mouth daily.   01/15/2015 at Unknown time  . furosemide (LASIX) 40 MG tablet Take 40 mg by mouth daily.  2 01/15/2015 at Unknown time  . hydrochlorothiazide  (HYDRODIURIL) 25 MG tablet Take 25 mg by mouth daily.  1 01/15/2015 at Unknown time  . levothyroxine (SYNTHROID, LEVOTHROID) 150 MCG tablet Take 150 mcg by mouth daily before breakfast.  1 01/15/2015 at Unknown time  . metolazone (ZAROXOLYN) 5 MG tablet Take 5 mg by mouth daily.   01/15/2015 at Unknown time  . metroNIDAZOLE (FLAGYL) 500 MG tablet Take 1 tablet (500 mg total) by mouth every 8 (eight) hours. 24 tablet 0 01/15/2015 at 1630  . potassium chloride SA (K-DUR,KLOR-CON) 20 MEQ tablet Take 20 mEq by mouth 2 (two) times daily.  6 01/15/2015 at Unknown time  . saccharomyces boulardii (FLORASTOR) 250 MG capsule Take 1 capsule (250 mg total) by mouth 2 (two) times daily. 20 capsule 0 01/15/2015 at Unknown time  . simvastatin (ZOCOR) 40 MG tablet Take 40 mg by mouth at bedtime.  1 01/14/2015 at Unknown time  . warfarin (COUMADIN) 3 MG tablet Take 1 tablet (3 mg total) by mouth every evening. 30 tablet 1 01/14/2015 at Unknown time   Social History   Social History  . Marital Status: Married    Spouse Name: [name]  . Number of Children: [name]  . Years of Education: [name]   Occupational History  . Not on file.   Social History Main Topics  . Smoking status: Former Games developer  . Smokeless tobacco: Not on file  . Alcohol Use: No  . Drug Use: Not on file  . Sexual Activity: Not on file   Other Topics Concern  . Not on file  Social History Narrative    Family History  Problem Relation Age of Onset  . Diabetes Mellitus II Mother   . Lung cancer Brother       Review of systems complete and found to be negative unless listed above      PHYSICAL EXAM  General: Well developed, well nourished, in no acute distress HEENT:  Normocephalic and atramatic Neck:  No JVD.  Lungs: Clear bilaterally to auscultation and percussion. Heart: HRRR . Normal S1 and S2 without gallops or murmurs.  Abdomen: Bowel sounds are positive, abdomen soft and non-tender  Msk:  Back normal, normal gait. Normal strength and tone  for age. Extremities: No clubbing, cyanosis or edema.   Neuro: Alert and oriented X 3. Psych:  Good affect, responds appropriately  Labs:   Lab Results  Component Value Date   WBC 8.5 01/17/2015   HGB 11.6* 01/17/2015   HCT 34.3* 01/17/2015   MCV 103.5* 01/17/2015   PLT 221 01/17/2015    Recent Labs Lab 01/18/15 0456  NA 136  K 3.1*  CL 102  CO2 25  BUN 79*  CREATININE 3.58*  CALCIUM 8.6*  PROT 4.6*  BILITOT 0.6  ALKPHOS 107  ALT 20  AST 51*  GLUCOSE 90   Lab Results  Component Value Date   TROPONINI 0.04* 01/11/2015   No results found for: CHOL No results found for: HDL No results found for: LDLCALC No results found for: TRIG No results found for: CHOLHDL No results found for: LDLDIRECT    Radiology: Dg Chest 1 View  01/16/2015   CLINICAL DATA:  Weakness and leukocytosis. White cell count 19,000. History of congestive heart failure and atrial fibrillation.  EXAM: CHEST  1 VIEW  COMPARISON:  None.  FINDINGS: Postoperative changes in the mediastinum. Shallow inspiration. Cardiac enlargement with mild pulmonary vascular congestion. No edema or consolidation. Slight blunting of left costophrenic angle suggesting a small effusion. No pneumothorax. Mediastinal contours appear intact.  IMPRESSION: Mild cardiac enlargement and mild pulmonary vascular congestion without edema or consolidation. Probable small left pleural effusion.   Electronically Signed   By: Burman Nieves M.D.   On: 01/16/2015 02:09   US Abdomen Complete  01/17/2015   CLINICAL DATA:  Abdominal distention and pain  EXAM: ULTRASOUND ABDOMEN COMPLETE  COMPARISON:  01/11/2015 similar exam  FINDINGS: Gallbladder: Surgically absent  Common bile duct: Diameter: 4 mm  Liver: Nodular contour with increased in echogenicity suggesting cirrhosis as seen previously. No focal abnormality or intrahepatic ductal dilatation. Perihepatic ascites is present.  IVC: No abnormality visualized.  Pancreas: Partly obscured by bowel  gas, normal in visualized portions.  Spleen: Size and appearance within normal limits.  Right Kidney: Length: 8.8 cm. Echogenicity within normal limits. No mass or hydronephrosis visualized.  Left Kidney: Length: 8.9 cm, with 2 closely opposed cysts left upper pole reidentified measuring 2.8 x 2.3 x 2.3 cm. Echogenicity within normal limits. No mass or hydronephrosis visualized.  Mildly lobulated renal contour bilaterally.  Abdominal aorta: No aneurysm visualized in the proximal aorta. Aorta otherwise obscured by bowel gas.  Other findings: Pleural effusions and ascites  IMPRESSION: Stable appearance of nodular hepatic contour and increased echogenicity suggesting cirrhosis.  Ascites and pleural effusions.   Electronically Signed   By: Christiana Pellant M.D.   On: 01/17/2015 09:43   US Abdomen Complete  01/11/2015   CLINICAL DATA:  81 year old male with generalized abdominal pain for 1 week, with diarrhea.  EXAM: ULTRASOUND ABDOMEN COMPLETE  COMPARISON:  Abdominal  ultrasound 07/26/2011.  FINDINGS: Gallbladder: Status post cholecystectomy.  Common bile duct: Diameter: 5.6 mm in the porta hepatis.  Liver: Nodular contour of the liver and heterogeneous echotexture throughout the hepatic parenchyma suggestive of underlying cirrhosis. No discrete cystic or solid hepatic lesions. Normal hepatopetal flow in the portal vein.  IVC: IVC appears dilated measuring up to 4.2 cm in diameter.  Pancreas: Visualized portion unremarkable.  Spleen: Size and appearance within normal limits.  6.4 cm in length.  Right Kidney: Length: 8.7 cm. Echogenicity within normal limits, however, cortex is thin and there is expansion of the central sinus echo complex. No mass or hydronephrosis visualized.  Left Kidney: Length: 9.8 cm. Echogenicity within normal limits, however, cortex is thin and there is expansion of the central sinus echo complex. Anechoic lesion with increased through transmission in the upper pole measuring 1.9 x 2.0 x 1.8 cm,  compatible with a simple cyst. No hydronephrosis visualized.  Abdominal aorta: No aneurysm visualized. However, there is ectasia of the upper abdominal aorta which measures up to 2.9 cm in diameter.  Other findings: Small volume of ascites.  Right pleural effusion.  IMPRESSION: 1. No acute findings. 2. Status post cholecystectomy. 3. Morphologic changes in the liver suggestive of cirrhosis, as above. 4. Cortical atrophy in the kidneys bilaterally. 5. 1.9 x 2.0 x 1.8 cm simple cyst in the upper pole of the left kidney. 6. Small volume of ascites. 7. Right pleural effusion. 8. IVC appears distended, which could suggest elevated intravascular volumes.   Electronically Signed   By: Trudie Reed M.D.   On: 01/11/2015 08:46    EKG: Sinus rhythm  ASSESSMENT AND PLAN:   1. Fluid retention, multifactorial, secondary to ischemic cardio myopathy, and chronic kidney disease, currently without shortness of breath 2. Chronic systolic congestive heart failure 3. Coronary artery disease, status post CABG, currently without chest pain 4. Atrial fibrillation, chads Vasc 6, on warfarin for stroke prevention and amiodarone for rhythm control 5. Chronic kidney disease, stage IV  Recommendations  1. Continue current medications 2. Continue gentle diuresis with furosemide and metolazone 3. Await nephrology consult 4. Continue warfarin for stroke prevention 5. Continue amiodarone for now, for rate and rhythm control   Signed: Karrin Eisenmenger MD,PhD, [facility] 01/18/2015, 1:20 PM

## 2015-01-18 NOTE — Care Management Important Message (Signed)
Important Message  Patient Details  Name: Blake White MRN: [id number] Date of Birth: 05-17-1933   Medicare Important Message Given:  Yes-third notification given    Gwenette Greet, RN 01/18/2015, 9:50 AM

## 2015-01-18 NOTE — Progress Notes (Signed)
[facility] - Inpatient Physicians - [place] at [facility]   PATIENT NAME: Blake White    MR#:  [id number]  DATE OF BIRTH:  May 19, 1933  SUBJECTIVE:  CHIEF COMPLAINT:  Patient's shortness of breath is better, feeling better out of bed to chair. Concerned about abdominal distention .Gettting BLLE wound care with [facility]. No history of hepatitis  REVIEW OF SYSTEMS:  CONSTITUTIONAL: No fever, reporting fatigue  EYES: No blurred or double vision.  EARS, NOSE, AND THROAT: No tinnitus or ear pain.  RESPIRATORY: No cough, reporting exetrional shortness of breath, wheezing or hemoptysis.  CARDIOVASCULAR: No chest pain, orthopnea, edema.  GASTROINTESTINAL: Reporting abdominal distention and diarrhea .No nausea, vomiting,or abdominal pain.  GENITOURINARY: No dysuria, hematuria.  ENDOCRINE: No polyuria, nocturia,  HEMATOLOGY: No anemia, easy bruising or bleeding SKIN: No rash or lesion. MUSCULOSKELETAL: No joint pain or arthritis.  Lower extremity swelling NEUROLOGIC: No tingling, numbness, weakness.  PSYCHIATRY: No anxiety or depression.   DRUG ALLERGIES:  No Known Allergies  VITALS:  Blood pressure 100/58, pulse 62, temperature 98 F (36.7 C), temperature source Oral, resp. rate 20, height 5\' 8"  (1.727 m), weight 108.773 kg (239 lb 12.8 oz), SpO2 99 %.  PHYSICAL EXAMINATION:  GENERAL:  81 y.o.-year-old patient lying in the bed with no acute distress.  EYES: Pupils equal, round, reactive to light and accommodation. No scleral icterus. Extraocular muscles intact.  HEENT: Head atraumatic, normocephalic. Oropharynx and nasopharynx clear.  NECK:  Supple, no jugular venous distention. No thyroid enlargement, no tenderness.  LUNGS: Moderate air entry , no wheezing, diffuse rales,rhonchi or crepitation. No use of accessory muscles of respiration.  CARDIOVASCULAR: Irregularly irregular. No murmurs, rubs, or gallops.  ABDOMEN: Soft, nontender, distended. Bowel sounds present. No organomegaly or  mass.  EXTREMITIES: Pitting 3+ pedal edema, cyanosis, or clubbing.  NEUROLOGIC: Cranial nerves II through XII are intact. Muscle strength 5/5 in all extremities. Sensation intact. Gait not checked.  PSYCHIATRIC: The patient is alert and oriented x 3.  SKIN: No obvious rash, lesion, or ulcer.    LABORATORY PANEL:   CBC  Recent Labs Lab 01/17/15 0523  WBC 8.5  HGB 11.6*  HCT 34.3*  PLT 221   ------------------------------------------------------------------------------------------------------------------  Chemistries   Recent Labs Lab 01/18/15 0456  NA 136  K 3.1*  CL 102  CO2 25  GLUCOSE 90  BUN 79*  CREATININE 3.58*  CALCIUM 8.6*  AST 51*  ALT 20  ALKPHOS 107  BILITOT 0.6   ------------------------------------------------------------------------------------------------------------------  Cardiac Enzymes No results for input(s): TROPONINI in the last 168 hours. ------------------------------------------------------------------------------------------------------------------  RADIOLOGY:  US Abdomen Complete  01/17/2015   CLINICAL DATA:  Abdominal distention and pain  EXAM: ULTRASOUND ABDOMEN COMPLETE  COMPARISON:  01/11/2015 similar exam  FINDINGS: Gallbladder: Surgically absent  Common bile duct: Diameter: 4 mm  Liver: Nodular contour with increased in echogenicity suggesting cirrhosis as seen previously. No focal abnormality or intrahepatic ductal dilatation. Perihepatic ascites is present.  IVC: No abnormality visualized.  Pancreas: Partly obscured by bowel gas, normal in visualized portions.  Spleen: Size and appearance within normal limits.  Right Kidney: Length: 8.8 cm. Echogenicity within normal limits. No mass or hydronephrosis visualized.  Left Kidney: Length: 8.9 cm, with 2 closely opposed cysts left upper pole reidentified measuring 2.8 x 2.3 x 2.3 cm. Echogenicity within normal limits. No mass or hydronephrosis visualized.  Mildly lobulated renal contour  bilaterally.  Abdominal aorta: No aneurysm visualized in the proximal aorta. Aorta otherwise obscured by bowel gas.  Other findings: Pleural effusions  and ascites  IMPRESSION: Stable appearance of nodular hepatic contour and increased echogenicity suggesting cirrhosis.  Ascites and pleural effusions.   Electronically Signed   By: Christiana Pellant M.D.   On: 01/17/2015 09:43    EKG:   Orders placed or performed during the hospital encounter of 01/15/15  . EKG 12-Lead  . EKG 12-Lead    ASSESSMENT AND PLAN:   Assessment/Plan This is a 81 year old Caucasian male with past medical history significant for heart failure and chronic kidney disease admitted for severe C. difficile colitis and worsening renal failure. 1. C. difficile colitis:  D/Ced flagyl as clinically not better and continue oral vancomycin 250 mg for 2 weeks total   Abdominal US with liver cirrhosis etiology unclear Will get hepatitis panel, will discuss with gastroenterology. Patient is on Coumadin and INR is at 2.19. Appreciate GI recs  2. Acute on chronic kidney disease stage IV: Worsening of CKD stage IV sec to prerenal and cardiorenal syn .  Baseline cr is 2.4 and sees [facility] nephrology -d/c PO diuretics - lasix, HCTZ and metolazone  We will avoid nephrotoxic agents  Appreciate  nephrology recommendations.   3.Acute on chronic systolic congestive heart failure: 30-35% ejection fraction on echocardiogram Strict intake and output Daily weight monitoring Fluid restriction Continue Lasix 40 mg IV every 12 hours and monitor renal function closely Spironolactone 100 mg is added to the regimen Abdominal US with liver cirrhosis which could be cardiac cirrhosis.  follow-up hepatitis panel GI doesn't think enough fluid to get diagnostic paracentesis  4. A. fib: Rate controlled. Continue amiodarone and coumadin management per pharmacy INR is therapeutic at 2.19  5. Hypothyroidism: Continue Synthroid  6. PVD - Neomia Dear /wound  care  6. DVT prophylaxis: as above   PT consult is placed for deconditioning     All the records are reviewed and case discussed with Care Management/Social Workerr. Management plans discussed with the patient, family -patient's wife, son Mr. Jillyn Hidden and they are in agreement.  CODE STATUS: full code  TOTAL TIME TAKING CARE OF THIS PATIENT: 35 minutes.   POSSIBLE D/C IN 2-3 DAYS, DEPENDING ON CLINICAL CONDITION.   Ramonita Lab M.D on 01/18/2015 at 4:10 PM  Between 7am to 6pm - Pager - [phone] After 6pm go to [email or web address] - password [email or web address]  [facility]  Office  [phone]  CC: Primary care physician; Sula Rumple, MD

## 2015-01-18 NOTE — Plan of Care (Signed)
Problem: Discharge Progression Outcomes Goal: Other Discharge Outcomes/Goals Outcome: Progressing Plan of care progress to goal: Blood pressure are consistenly low.  Rest of vital WDL Pt complains of no pain. Diet - heart healthy Activity - Pt stands with one assist.  Working with PT

## 2015-01-19 LAB — MAGNESIUM: MAGNESIUM: 1.8 mg/dL (ref 1.7–2.4)

## 2015-01-19 LAB — POTASSIUM: POTASSIUM: 3.4 mmol/L — AB (ref 3.5–5.1)

## 2015-01-19 LAB — PROTIME-INR
INR: 2.44
PROTHROMBIN TIME: 26.6 s — AB (ref 11.4–15.0)

## 2015-01-19 MED ORDER — MIDODRINE HCL 5 MG PO TABS
5.0000 mg | ORAL_TABLET | Freq: Three times a day (TID) | ORAL | Status: DC
  Administered 2015-01-19 – 2015-01-20 (×3): 5 mg via ORAL
  Filled 2015-01-19 (×4): qty 1

## 2015-01-19 MED ORDER — POTASSIUM CHLORIDE 20 MEQ PO PACK
40.0000 meq | PACK | Freq: Once | ORAL | Status: AC
  Administered 2015-01-19: 18:00:00 40 meq via ORAL
  Filled 2015-01-19: qty 2

## 2015-01-19 NOTE — Plan of Care (Signed)
Problem: Discharge Progression Outcomes Goal: Other Discharge Outcomes/Goals Outcome: Progressing Plan of care progress to goal: Pt complains of no pain. Baseline low BP.   C. Diff resolved, pt to follow up with MD r/t cirrhosis. Diet - tolerating Activity - pt one assist transfer and standby

## 2015-01-19 NOTE — Progress Notes (Signed)
Subjective:   Doing well overall Ambulatory Cr improved slightly to 3.58 from 4.08 UOP 1450  Objective:  Vital signs in last 24 hours:  Temp:  [97.6 F (36.4 C)-98 F (36.7 C)] 97.8 F (36.6 C) (09/13 0456) Pulse Rate:  [60-62] 60 (09/13 0456) Resp:  [18-20] 18 (09/13 0456) BP: (91-100)/(50-58) 91/50 mmHg (09/13 0456) SpO2:  [97 %-100 %] 97 % (09/13 0456) Weight:  [108.047 kg (238 lb 3.2 oz)] 108.047 kg (238 lb 3.2 oz) (09/13 0500)  Weight change: -0.726 kg (-1 lb 9.6 oz) Filed Weights   01/17/15 0500 01/18/15 0525 01/19/15 0500  Weight: 108.636 kg (239 lb 8 oz) 108.773 kg (239 lb 12.8 oz) 108.047 kg (238 lb 3.2 oz)    Intake/Output: I/O last 3 completed shifts: In: -  Out: 2600 [Urine:2600]   Intake/Output this shift:  Total I/O In: -  Out: 300 [Urine:300]  Physical Exam: General: NAD, sitting up in chair  Head: Normocephalic, atraumatic. Moist oral mucosal membranes  Eyes: Anicteric,   Neck: Supple, trachea midline  Lungs:  Mild crackles b/l at bases  Heart: Regular rate and rhythm  Abdomen:  Soft, nontender, +abdominal wall edema, Distended  Extremities:  3+ peripheral edema.  Neurologic: Nonfocal, ambulatory  Skin: No lesions  GU: +scrotal edema    Basic Metabolic Panel:  Recent Labs Lab 01/15/15 2359 01/17/15 0523 01/18/15 0456  NA 134* 136 136  K 3.6 3.4* 3.1*  CL 103 104 102  CO2 _0 GLUCOSE 119* 99 90  BUN 69* 76* 79*  CREATININE 3.97* 4.08* 3.58*  CALCIUM 8.6* 8.5* 8.6*    Liver Function Tests:  Recent Labs Lab 01/15/15 2359 01/18/15 0456  AST 52* 51*  ALT 21 20  ALKPHOS 107 107  BILITOT 1.0 0.6  PROT 5.1* 4.6*  ALBUMIN 2.2* 2.1*   No results for input(s): LIPASE, AMYLASE in the last 168 hours. No results for input(s): AMMONIA in the last 168 hours.  CBC:  Recent Labs Lab 01/15/15 2359 01/17/15 0523  WBC 19.2* 8.5  NEUTROABS 17.1*  --   HGB 12.0* 11.6*  HCT 36.4* 34.3*  MCV 103.9* 103.5*  PLT 217 221     Cardiac Enzymes: No results for input(s): CKTOTAL, CKMB, CKMBINDEX, TROPONINI in the last 168 hours.  BNP: Invalid input(s): POCBNP  CBG: No results for input(s): GLUCAP in the last 168 hours.  Microbiology: Results for orders placed or performed during the hospital encounter of 01/15/15  Culture, blood (routine x 2)     Status: None (Preliminary result)   Collection Time: 01/16/15  2:29 AM  Result Value Ref Range Status   Specimen Description BLOOD RIGHT ASSIST CONTROL  Final   Special Requests BOTTLES DRAWN AEROBIC AND ANAEROBIC 5ML  Final   Culture NO GROWTH 3 DAYS  Final   Report Status PENDING  Incomplete  Culture, blood (routine x 2)     Status: None (Preliminary result)   Collection Time: 01/16/15  2:29 AM  Result Value Ref Range Status   Specimen Description BLOOD LEFT HAND  Final   Special Requests BOTTLES DRAWN AEROBIC AND ANAEROBIC 5ML  Final   Culture NO GROWTH 3 DAYS  Final   Report Status PENDING  Incomplete    Coagulation Studies:  Recent Labs  01/17/15 0523 01/18/15 0456 01/19/15 0428  LABPROT 24.3* 24.5* 26.6*  INR 2.17 2.19 2.44    Urinalysis:  Recent Labs  01/16/15 1316  COLORURINE YELLOW*  LABSPEC 1.005  PHURINE 5.0  GLUCOSEU  NEGATIVE  HGBUR NEGATIVE  BILIRUBINUR NEGATIVE  KETONESUR NEGATIVE  PROTEINUR NEGATIVE  NITRITE NEGATIVE  LEUKOCYTESUR NEGATIVE      Imaging: No results found.   Medications:     . allopurinol  150 mg Oral Daily  . amiodarone  200 mg Oral Daily  . aspirin EC  81 mg Oral Daily  . cholecalciferol  1,000 Units Oral Daily  . furosemide  40 mg Intravenous Q12H  . levothyroxine  150 mcg Oral QAC breakfast  . potassium chloride SA  20 mEq Oral BID  . simvastatin  40 mg Oral QHS  . sodium chloride  3 mL Intravenous Q12H  . spironolactone  100 mg Oral Daily  . vancomycin  250 mg Oral 4 times per day  . warfarin  3 mg Oral QPM   acetaminophen **OR** acetaminophen, morphine injection, ondansetron **OR**  ondansetron (ZOFRAN) IV  Assessment/ Plan:  Mr. Blake White is a 81 y.o. white male with congestive heart failure, atrial fibrillation, hypothyroidism, BPH, hyperlipidemia, hypertension, coronary artery disease status post CABG, who was admitted to [facility] on 01/15/2015 for C. Diff colitis.   1. Acute kidney failure on chronic kidney disease stage IV 2. Acute exacerbation of congestive heart failure: 3. Ascites, Cirrhosis of liver 4. Edema: generalized.    Plan: Acute renal failure seems to be multifactorial with prerenal azotemia and now with acute cardiorenal syndrome.  Chronic kidney disease stage IV followed by [facility]ph[facility].   Baseline creatinine of 2.4, eGFR of 26. - Continue IV furosemide 72m q12. Responding well.   Add midodrine   LOS: 3 Blake White 9/13/20169:40 AM

## 2015-01-19 NOTE — Progress Notes (Signed)
Physical Therapy Treatment Patient Details Name: Blake White MRN: [id number] DOB: 1933-11-20 Today's Date: 01/19/2015    History of Present Illness Pt presents with generalized weakeness due to acute kidney injury and c-diff    PT Comments    Pt was sleeping seated in chair without an alarm upon arrival to the room today. Pt was able to demonstrate good static standing balance and ability to forward reach outside his BOS today while using the restroom during treatment. Pt was also able to display good capacity for mobility while walking ~175 ft around the unit. Pt displayed slower than normal walking speed compared to age related norms. Future PT session should include stair training if pt condition and strength continues to progress in order to facilitate return to home environment. Pt still requires skilled PT services in order to progress functional mobility abilities.  Follow Up Recommendations  Home health PT     Equipment Recommendations  Rolling walker with 5" wheels    Recommendations for Other Services       Precautions / Restrictions Precautions Precautions: Fall Restrictions Weight Bearing Restrictions: No    Mobility  Bed Mobility               General bed mobility comments:  (pt sitting in chair upon arrival today)  Transfers Overall transfer level: Independent Equipment used: Rolling walker (2 wheeled)                Ambulation/Gait Ambulation/Gait assistance: Supervision;Modified independent (Device/Increase time) Ambulation Distance (Feet): 175 Feet Assistive device: Rolling walker (2 wheeled) Gait Pattern/deviations: Step-through pattern;Decreased step length - right;Decreased step length - left;Decreased stride length;Trunk flexed   Gait velocity interpretation: Below normal speed for age/gender General Gait Details:  (pt did well with gait today, was able to maintain decent gait pace throughout 175 ft)   Stairs             Wheelchair Mobility    Modified Rankin (Stroke Patients Only)       Balance Overall balance assessment: Modified Independent                                  Cognition Arousal/Alertness: Awake/alert Behavior During Therapy: WFL for tasks assessed/performed Overall Cognitive Status: Within Functional Limits for tasks assessed                      Exercises Other Exercises Other Exercises: gait x 25 ft w/ RW to bathroom w/ standing balance during urination without holding onto rolling walker and CGA , gait x 175 ft with RW and CGA    General Comments        Pertinent Vitals/Pain Pain Assessment: No/denies pain    Home Living                      Prior Function            PT Goals (current goals can now be found in the care plan section) Acute Rehab PT Goals Patient Stated Goal: "to get his strength back" PT Goal Formulation: With patient Time For Goal Achievement: 02/01/15 Potential to Achieve Goals: Good Progress towards PT goals: Progressing toward goals    Frequency  Min 2X/week    PT Plan Current plan remains appropriate    Co-evaluation             End of Session Equipment Utilized During  Treatment: Gait belt Activity Tolerance: Patient tolerated treatment well;No increased pain Patient left: in chair;with family/visitor present;with call bell/phone within reach     Time: 1435-1505 PT Time Calculation (min) (ACUTE ONLY): 30 min  Charges:                       G Codes:      Blake White, SPT 01/19/2015, 3:21 PM

## 2015-01-19 NOTE — Plan of Care (Signed)
Problem: Discharge Progression Outcomes Goal: Pain controlled with appropriate interventions Outcome: Progressing No c/o pain Goal: Hemodynamically stable Outcome: Progressing Bun/ creat improving slightly. Goal: Activity appropriate for discharge plan Outcome: Progressing Chair most of day tol well. amb in hall with p.t.  With walker. tol well.  Voiding on lasix.

## 2015-01-19 NOTE — Care Management (Signed)
Admitted to [facility] with the diagnosis of Acute kidney injury. Discharged from this facility 01/12/15. Lives with wife, Tamela Oddi [phone]). Sees Dr. Elmer Ramp. No home health in the past. No skilled facility. Uses no home oxygen. Takes care of all basic activities of daily living himself. Physical therapy evaluation completed. Recommends home health/physical therapy. Gwenette Greet RN MSN Care Management [phone]

## 2015-01-19 NOTE — Progress Notes (Signed)
MEDICATION RELATED CONSULT NOTE - INITIAL   Pharmacy Consult for electrolyte management Indication: hypokalemia  No Known Allergies  Patient Measurements: Height: 5\' 8"  (172.7 cm) Weight: 238 lb 3.2 oz (108.047 kg) IBW/kg (Calculated) : 68.4  Vital Signs: Temp: 97.6 F (36.4 C) (09/13 1329) Temp Source: Oral (09/13 1329) BP: 93/51 mmHg (09/13 1329) Pulse Rate: 64 (09/13 1329) Intake/Output from previous day: 09/12 0701 - 09/13 0700 In: -  Out: 1450 [Urine:1450] Intake/Output from this shift: Total I/O In: -  Out: 300 [Urine:300]  Labs:  Recent Labs  01/17/15 0523 01/18/15 0456 01/19/15 1546  WBC 8.5  --   --   HGB 11.6*  --   --   HCT 34.3*  --   --   PLT 221  --   --   CREATININE 4.08* 3.58*  --   MG  --   --  1.8  ALBUMIN  --  2.1*  --   PROT  --  4.6*  --   AST  --  51*  --   ALT  --  20  --   ALKPHOS  --  107  --   BILITOT  --  0.6  --    Estimated Creatinine Clearance: 19.9 mL/min (by C-G formula based on Cr of 3.58).   Microbiology: Recent Results (from the past 720 hour(s))  Stool culture     Status: None   Collection Time: 01/11/15  9:25 AM  Result Value Ref Range Status   Specimen Description STOOL  Final   Special Requests NONE  Final   Culture   Final    NO SALMONELLA OR SHIGELLA ISOLATED No Pathogenic E. coli detected NO CAMPYLOBACTER DETECTED    Report Status 01/15/2015 FINAL  Final  C difficile quick scan w PCR reflex     Status: Abnormal   Collection Time: 01/11/15  9:25 AM  Result Value Ref Range Status   C Diff antigen POSITIVE (A) NEGATIVE Final   C Diff toxin POSITIVE (A) NEGATIVE Final   C Diff interpretation   Final    Positive for toxigenic C. difficile, active toxin production present.    Comment: DOLL FERGUSON AT 1032 ON 01/11/15 BY [facility] CRITICAL RESULT CALLED TO, READ BACK BY AND VERIFIED WITH:   Culture, blood (routine x 2)     Status: None (Preliminary result)   Collection Time: 01/16/15  2:29 AM  Result Value Ref  Range Status   Specimen Description BLOOD RIGHT ASSIST CONTROL  Final   Special Requests BOTTLES DRAWN AEROBIC AND ANAEROBIC  Final   Culture NO GROWTH 3 DAYS  Final   Report Status PENDING  Incomplete  Culture, blood (routine x 2)     Status: None (Preliminary result)   Collection Time: 01/16/15  2:29 AM  Result Value Ref Range Status   Specimen Description BLOOD LEFT HAND  Final   Special Requests BOTTLES DRAWN AEROBIC AND ANAEROBIC  Final   Culture NO GROWTH 3 DAYS  Final   Report Status PENDING  Incomplete    Medical History: Past Medical History  Diagnosis Date  . CHF (congestive heart failure)   . BPH (benign prostatic hyperplasia)   . A-fib   . Thyroid disease   . High cholesterol     Assessment: Pharmacy consulted for electrolyte management. Patient's Mg was 1.8 mg/dL and K was 3.4 mmol/L today.   Patient is currently receiving potassium chloride SA (KLOR-CON) 20 mEq PO BID, spironolactone 100 mg PO daily,  and furosemide 40 mg IV BID.  Goal of Therapy:  Electrolytes within normal limits.   Plan:  Mg within normal limits, no supplementation needed at this time. K was low at 3.4 mmol/L - will supplement with potassium chloride packet 40 mEq PO once and recheck electrolytes with morning labs tomorrow. Pharmacy will continue to monitor.  Thank you for the consult.  Jodelle Red Yelina Sarratt 01/19/2015,6:16 PM

## 2015-01-19 NOTE — Progress Notes (Signed)
ANTICOAGULATION CONSULT NOTE - Initial Consult  Pharmacy Consult for warfarin dosing and monitoring Indication: atrial fibrillation  No Known Allergies  Patient Measurements: Height:  (172.7 cm) Weight: 238 lb 3.2 oz (108.047 kg) IBW/kg (Calculated) : 68.4   Vital Signs: Temp: 97.8 F (36.6 C) (09/13 0456) Temp Source: Oral (09/13 0456) BP: 91/50 mmHg (09/13 0456) Pulse Rate: 60 (09/13 0456)  Labs:  Recent Labs  01/17/15 0523 01/18/15 0456 01/19/15 0428  HGB 11.6*  --   --   HCT 34.3*  --   --   PLT 221  --   --   LABPROT 24.3* 24.5* 26.6*  INR 2.17 2.19 2.44  CREATININE 4.08* 3.58*  --     Estimated Creatinine Clearance: 19.9 mL/min (by C-G formula based on Cr of 3.58).   Medical History: Past Medical History  Diagnosis Date  . CHF (congestive heart failure)   . BPH (benign prostatic hyperplasia)   . A-fib   . Thyroid disease   . High cholesterol     Medications:  Prescriptions prior to admission  Medication Sig Dispense Refill Last Dose  . allopurinol (ZYLOPRIM) 300 MG tablet Take 150 mg by mouth daily.  11 01/15/2015 at Unknown time  . amiodarone (PACERONE) 200 MG tablet Take 200 mg by mouth daily.  1 01/15/2015 at Unknown time  . aspirin EC 81 MG tablet Take 81 mg by mouth daily.   01/15/2015 at Unknown time  . Cholecalciferol (VITAMIN D3) 1000 UNITS CAPS Take 1,000 Units by mouth daily.   01/15/2015 at Unknown time  . furosemide (LASIX) 40 MG tablet Take 40 mg by mouth daily.  2 01/15/2015 at Unknown time  . hydrochlorothiazide (HYDRODIURIL) 25 MG tablet Take 25 mg by mouth daily.  1 01/15/2015 at Unknown time  . levothyroxine (SYNTHROID, LEVOTHROID) 150 MCG tablet Take 150 mcg by mouth daily before breakfast.  1 01/15/2015 at Unknown time  . metolazone (ZAROXOLYN) 5 MG tablet Take 5 mg by mouth daily.   01/15/2015 at Unknown time  . metroNIDAZOLE (FLAGYL) 500 MG tablet Take 1 tablet (500 mg total) by mouth every 8 (eight) hours. 24 tablet 0 01/15/2015 at 1630  .  potassium chloride SA (K-DUR,KLOR-CON) 20 MEQ tablet Take 20 mEq by mouth 2 (two) times daily.  6 01/15/2015 at Unknown time  . saccharomyces boulardii (FLORASTOR) 250 MG capsule Take 1 capsule (250 mg total) by mouth 2 (two) times daily. 20 capsule 0 01/15/2015 at Unknown time  . simvastatin (ZOCOR) 40 MG tablet Take 40 mg by mouth at bedtime.  1 01/14/2015 at Unknown time  . warfarin (COUMADIN) 3 MG tablet Take 1 tablet (3 mg total) by mouth every evening. 30 tablet 1 01/14/2015 at Unknown time    Assessment: Patient is a 81yo male who has a PMH of A.Fib, C.Diff, CKD, hypothyroidism and CHF . Patient was controlled on warfarin  for A.Fib prior to hospital admission. He is currently on metronidazole regimen for positive C. Diff toxin and positive C.diff Antigen. Pharmacy has been consulted to dose and monitor warfarin. INR 2.32, PT 25.6, and hgb 12. Pharmacy is aware metronidazole may cause an increase in patients INR.   Goal of Therapy:  INR 2-3    Plan:  Will continue patients home dose of  daily. PT-INR ordered for 0500 tomorrow. Pharmacy will continue to monitor patients INR daily and adjust warfarin as needed.   9/11 INR 2.17. Continue current regimen. INR in AM.  9/12 AM INR 2.19. Continue  current regimen. Recheck in AM.  9/13 AM INR 2.44 Continue current regimen 3mg  tonight. Recheck in AM  Kathlynn Swofford D Moua Rasmusson, Pharm.D Clinical Pharmacist   01/19/2015

## 2015-01-19 NOTE — Progress Notes (Signed)
[facility] [place] at [facility]   PATIENT NAME: Blake White    MR#:  [id number]  DATE OF BIRTH:  06/09/1933  SUBJECTIVE:  CHIEF COMPLAINT:  Patient's shortness of breath is better, feeling better out of bed to chair but still complaining of significant lower extent is swelling. Had one bowel movement yesterday. No history of hepatitis  REVIEW OF SYSTEMS:  CONSTITUTIONAL: No fever, reporting fatigue  EYES: No blurred or double vision.  EARS, NOSE, AND THROAT: No tinnitus or ear pain.  RESPIRATORY: No cough, reporting exetrional shortness of breath, wheezing or hemoptysis.  CARDIOVASCULAR: No chest pain, orthopnea, edema.  GASTROINTESTINAL: Reporting less abdominal distention and diarrhea resolved.No nausea, vomiting,or abdominal pain.  GENITOURINARY: No dysuria, hematuria.  ENDOCRINE: No polyuria, nocturia,  HEMATOLOGY: No anemia, easy bruising or bleeding SKIN: No rash or lesion. MUSCULOSKELETAL: No joint pain or arthritis.  Lower extremity swelling NEUROLOGIC: No tingling, numbness, weakness.  PSYCHIATRY: No anxiety or depression.   DRUG ALLERGIES:  No Known Allergies  VITALS:  Blood pressure 93/51, pulse 64, temperature 97.6 F (36.4 C), temperature source Oral, resp. rate 18, height 5\' 8"  (1.727 m), weight 108.047 kg (238 lb 3.2 oz), SpO2 100 %.  PHYSICAL EXAMINATION:  GENERAL:  81 y.o.-year-old patient lying in the bed with no acute distress.  EYES: Pupils equal, round, reactive to light and accommodation. No scleral icterus. Extraocular muscles intact.  HEENT: Head atraumatic, normocephalic. Oropharynx and nasopharynx clear.  NECK:  Supple, no jugular venous distention. No thyroid enlargement, no tenderness.  LUNGS: Moderate air entry , no wheezing, diffuse rales,rhonchi or crepitation. No use of accessory muscles of respiration.  CARDIOVASCULAR: Irregularly irregular. No murmurs, rubs, or gallops.  ABDOMEN: Soft, nontender, distended. Bowel  sounds present. No organomegaly or mass.  EXTREMITIES: Pitting 3+ pedal edema, cyanosis, or clubbing.  NEUROLOGIC: Cranial nerves II through XII are intact. Muscle strength 5/5 in all extremities. Sensation intact. Gait not checked.  PSYCHIATRIC: The patient is alert and oriented x 3.  SKIN: No obvious rash, lesion, or ulcer.    LABORATORY PANEL:   CBC  Recent Labs Lab 01/17/15 0523  WBC 8.5  HGB 11.6*  HCT 34.3*  PLT 221   ------------------------------------------------------------------------------------------------------------------  Chemistries   Recent Labs Lab 01/18/15 0456  NA 136  K 3.1*  CL 102  CO2 25  GLUCOSE 90  BUN 79*  CREATININE 3.58*  CALCIUM 8.6*  AST 51*  ALT 20  ALKPHOS 107  BILITOT 0.6   ------------------------------------------------------------------------------------------------------------------  Cardiac Enzymes No results for input(s): TROPONINI in the last 168 hours. ------------------------------------------------------------------------------------------------------------------  RADIOLOGY:  No results found.  EKG:   Orders placed or performed during the hospital encounter of 01/15/15  . EKG 12-Lead  . EKG 12-Lead    ASSESSMENT AND PLAN:   Assessment/Plan This is a 81 year old Caucasian male with past medical history significant for heart failure and chronic kidney disease admitted for severe C. difficile colitis and worsening renal failure. 1. C. difficile colitis:  D/Ced flagyl as clinically not better and continue oral vancomycin 250 mg for 2 weeks total   Abdominal US with liver cirrhosis etiology unclear  Negative  hepatitis panel Patient is on Coumadin and INR is at 2.19. Appreciate GI recs  2. Acute on chronic kidney disease stage IV: Worsening of CKD stage IV sec to prerenal and cardiorenal syn .  Baseline cr is 2.4 and sees [facility] nephrology -d/c PO diuretics - lasix, HCTZ and metolazone  We will avoid nephrotoxic  agents  Appreciate  nephrology recommendations.   3.Acute on chronic systolic congestive heart failure: 30-35% ejection fraction on echocardiogram Strict intake and output Daily weight monitoring Fluid restriction Continue Lasix 40 mg IV every 12 hours and monitor renal function closely Spironolactone 100 mg is added to the regimen Abdominal US with liver cirrhosis which could be cardiac cirrhosis.  follow-up hepatitis panel is negative GI doesn't think enough fluid to get diagnostic paracentesis  4. A. fib: Rate controlled. Continue amiodarone and coumadin management per pharmacy INR is therapeutic at 2.19  5. Hypothyroidism: Continue Synthroid  6. PVD - Neomia Dear /wound care  6. DVT prophylaxis: as above   PT consult is placed for deconditioning     All the records are reviewed and case discussed with Care Management/Social Workerr. Management plans discussed with the patient, family -patient's wife, son Mr. Jillyn Hidden and they are in agreement.  CODE STATUS: full code  TOTAL TIME TAKING CARE OF THIS PATIENT: 35 minutes.   POSSIBLE D/C IN 1-2  DAYS, DEPENDING ON CLINICAL CONDITION.   Ramonita Lab M.D on 01/19/2015 at 3:32 PM  Between 7am to 6pm - Pager - [phone] After 6pm go to [email or web address] - password [email or web address] [facility]  [place] [facility]  Office  [phone]  CC: Primary care physician; Sula Rumple, MD

## 2015-01-20 LAB — PROTIME-INR
INR: 2.67
Prothrombin Time: 28.5 seconds — ABNORMAL HIGH (ref 11.4–15.0)

## 2015-01-20 LAB — BASIC METABOLIC PANEL
ANION GAP: 9 (ref 5–15)
BUN: 80 mg/dL — AB (ref 6–20)
CO2: 28 mmol/L (ref 22–32)
Calcium: 8.7 mg/dL — ABNORMAL LOW (ref 8.9–10.3)
Chloride: 101 mmol/L (ref 101–111)
Creatinine, Ser: 3.34 mg/dL — ABNORMAL HIGH (ref 0.61–1.24)
GFR calc Af Amer: 19 mL/min — ABNORMAL LOW (ref >60)
GFR, EST NON AFRICAN AMERICAN: 16 mL/min — AB (ref >60)
GLUCOSE: 96 mg/dL (ref 65–99)
POTASSIUM: 4.2 mmol/L (ref 3.5–5.1)
Sodium: 138 mmol/L (ref 135–145)

## 2015-01-20 LAB — CBC
HEMATOCRIT: 35.5 % — AB (ref 40.0–52.0)
Hemoglobin: 11.7 g/dL — ABNORMAL LOW (ref 13.0–18.0)
MCH: 34.1 pg — AB (ref 26.0–34.0)
MCHC: 33 g/dL (ref 32.0–36.0)
MCV: 103.4 fL — AB (ref 80.0–100.0)
PLATELETS: 244 10*3/uL (ref 150–440)
RBC: 3.43 MIL/uL — AB (ref 4.40–5.90)
RDW: 16.7 % — AB (ref 11.5–14.5)
WBC: 7.4 10*3/uL (ref 3.8–10.6)

## 2015-01-20 LAB — MAGNESIUM: Magnesium: 1.9 mg/dL (ref 1.7–2.4)

## 2015-01-20 MED ORDER — MIDODRINE HCL 5 MG PO TABS: 5.0000 mg | ORAL_TABLET | Freq: Three times a day (TID) | ORAL | Status: AC

## 2015-01-20 MED ORDER — SPIRONOLACTONE 100 MG PO TABS: 100.0000 mg | ORAL_TABLET | Freq: Every day | ORAL | Status: DC

## 2015-01-20 MED ORDER — FUROSEMIDE 40 MG PO TABS: 40.0000 mg | ORAL_TABLET | Freq: Two times a day (BID) | ORAL | Status: DC

## 2015-01-20 MED ORDER — VANCOMYCIN 50 MG/ML ORAL SOLUTION: 250.0000 mg | Freq: Four times a day (QID) | ORAL | Status: AC

## 2015-01-20 NOTE — Plan of Care (Signed)
Problem: Discharge Progression Outcomes Goal: Other Discharge Outcomes/Goals Outcome: Progressing Plan of care progress to goal: BP runs hypotensive. Pt complains of no pain this shift. 1 standby assist to BR with walker. Scrotal swelling minimally improved.  Leg edema minimally improved. Diet - tolerating

## 2015-01-20 NOTE — Plan of Care (Signed)
Problem: Discharge Progression Outcomes Goal: Discharge plan in place and appropriate Individualization:  1. Patient goes by [place] and lives at home with his wife.  2. Recently discharged 01/12/2015 per patient. #. Patient has history of CHF, BPA, Afib, thyroid disease, CKD hyperlipidema controlled at home with medications.  Outcome: Progressing Home with advanced home care Goal: Pain controlled with appropriate interventions Outcome: Progressing No  Co pain Goal: Activity appropriate for discharge plan Outcome: Progressing Uses walker at home and pt  Is stronger.  P.t. amb and pt in chair more.  On vanc po for the c diff.

## 2015-01-20 NOTE — Progress Notes (Signed)
MEDICATION RELATED CONSULT NOTE - INITIAL   Pharmacy Consult for electrolyte management Indication: hypokalemia  No Known Allergies  Patient Measurements: Height:  (172.7 cm) Weight: 239 lb 8 oz (108.636 kg) IBW/kg (Calculated) : 68.4  Vital Signs: Temp: 97.7 F (36.5 C) (09/14 0537) Temp Source: Oral (09/14 0537) BP: 108/61 mmHg (09/14 0537) Pulse Rate: 64 (09/14 0537) Intake/Output from previous day: 09/13 0701 - 09/14 0700 In: -  Out: 1650 [Urine:1650] Intake/Output from this shift:    Labs:  Recent Labs  01/18/15 0456 01/19/15 1546 01/20/15 0454  WBC  --   --  7.4  HGB  --   --  11.7*  HCT  --   --  35.5*  PLT  --   --  244  CREATININE 3.58*  --  3.34*  MG  --  1.8 1.9  ALBUMIN 2.1*  --   --   PROT 4.6*  --   --   AST 51*  --   --   ALT 20  --   --   ALKPHOS 107  --   --   BILITOT 0.6  --   --    Estimated Creatinine Clearance: 21.4 mL/min (by C-G formula based on Cr of 3.34).   Microbiology: Recent Results (from the past 720 hour(s))  Stool culture     Status: None   Collection Time: 01/11/15  9:25 AM  Result Value Ref Range Status   Specimen Description STOOL  Final   Special Requests NONE  Final   Culture   Final    NO SALMONELLA OR SHIGELLA ISOLATED No Pathogenic E. coli detected NO CAMPYLOBACTER DETECTED    Report Status 01/15/2015 FINAL  Final  C difficile quick scan w PCR reflex     Status: Abnormal   Collection Time: 01/11/15  9:25 AM  Result Value Ref Range Status   C Diff antigen POSITIVE (A) NEGATIVE Final   C Diff toxin POSITIVE (A) NEGATIVE Final   C Diff interpretation   Final    Positive for toxigenic C. difficile, active toxin production present.    Comment: DOLL FERGUSON AT 1032 ON 01/11/15 BY [facility] CRITICAL RESULT CALLED TO, READ BACK BY AND VERIFIED WITH:   Culture, blood (routine x 2)     Status: None (Preliminary result)   Collection Time: 01/16/15  2:29 AM  Result Value Ref Range Status   Specimen Description BLOOD  RIGHT ASSIST CONTROL  Final   Special Requests BOTTLES DRAWN AEROBIC AND ANAEROBIC  Final   Culture NO GROWTH 3 DAYS  Final   Report Status PENDING  Incomplete  Culture, blood (routine x 2)     Status: None (Preliminary result)   Collection Time: 01/16/15  2:29 AM  Result Value Ref Range Status   Specimen Description BLOOD LEFT HAND  Final   Special Requests BOTTLES DRAWN AEROBIC AND ANAEROBIC  Final   Culture NO GROWTH 3 DAYS  Final   Report Status PENDING  Incomplete    Medical History: Past Medical History  Diagnosis Date  . CHF (congestive heart failure)   . BPH (benign prostatic hyperplasia)   . A-fib   . Thyroid disease   . High cholesterol     Assessment: Pharmacy consulted for electrolyte management. Patient's Mg was 1.9 mg/dL and K was 4.1 mmol/L today.   Patient is currently receiving potassium chloride SA (KLOR-CON) 20 mEq PO BID, spironolactone 100 mg PO daily, and furosemide 40 mg IV BID.  Goal  of Therapy:  Electrolytes within normal limits.   Plan:  Mg and K are WNL. No additional supplementation needed. Pharmacy will continue to monitor. Recheck labs in AM since spironolactone is new to regimen.   Thank you for the consult.  Laurene Footman Tonantzin Mimnaugh 01/20/2015,7:44 AM

## 2015-01-20 NOTE — Discharge Instructions (Signed)
Activity per PT Diet- healthy heart Wound care by home health and hePRN with Zinc oxide dressing and proban wrap Keep legs elevated by 4-5 pillows F/u with PCP , [facility] NEPHRO, GASTRO AND CARDIO AS recommended  Information on my medicine - Coumadin   (Warfarin)  This medication education was reviewed with me or my healthcare representative as part of my discharge preparation.  The pharmacist that spoke with me during my hospital stay was:  Olene Floss, [facility]  Why was Coumadin prescribed for you? Coumadin was prescribed for you because you have a blood clot or a medical condition that can cause an increased risk of forming blood clots. Blood clots can cause serious health problems by blocking the flow of blood to the heart, lung, or brain. Coumadin can prevent harmful blood clots from forming. As a reminder your indication for Coumadin is:   Select from menu  What test will check on my response to Coumadin? While on Coumadin (warfarin) you will need to have an INR test regularly to ensure that your dose is keeping you in the desired range. The INR (international normalized ratio) number is calculated from the result of the laboratory test called prothrombin time (PT).  If an INR APPOINTMENT HAS NOT ALREADY BEEN MADE FOR YOU please schedule an appointment to have this lab work done by your health care provider within 7 days. Your INR goal is usually a number between:  2 to 3 or your provider may give you a more narrow range like 2-2.5.  Ask your health care provider during an office visit what your goal INR is.  What  do you need to  know  About  COUMADIN? Take Coumadin (warfarin) exactly as prescribed by your healthcare provider about the same time each day.  DO NOT stop taking without talking to the doctor who prescribed the medication.  Stopping without other blood clot prevention medication to take the place of Coumadin may increase your risk of developing a new clot or stroke.  Get refills  before you run out.  What do you do if you miss a dose? If you miss a dose, take it as soon as you remember on the same day then continue your regularly scheduled regimen the next day.  Do not take two doses of Coumadin at the same time.  Important Safety Information A possible side effect of Coumadin (Warfarin) is an increased risk of bleeding. You should call your healthcare provider right away if you experience any of the following: ? Bleeding from an injury or your nose that does not stop. ? Unusual colored urine (red or dark brown) or unusual colored stools (red or black). ? Unusual bruising for unknown reasons. ? A serious fall or if you hit your head (even if there is no bleeding).  Some foods or medicines interact with Coumadin (warfarin) and might alter your response to warfarin. To help avoid this: ? Eat a balanced diet, maintaining a consistent amount of Vitamin K. ? Notify your provider about major diet changes you plan to make. ? Avoid alcohol or limit your intake to 1 drink for women and 2 drinks for men per day. (1 drink is 5 oz. wine, 12 oz. beer, or 1.5 oz. liquor.)  Make sure that ANY health care provider who prescribes medication for you knows that you are taking Coumadin (warfarin).  Also make sure the healthcare provider who is monitoring your Coumadin knows when you have started a new medication including herbals and  non-prescription products.  Coumadin (Warfarin)  Major Drug Interactions  Increased Warfarin Effect Decreased Warfarin Effect  Alcohol (large quantities) Antibiotics (esp. Septra/Bactrim, Flagyl, Cipro) Amiodarone (Cordarone) Aspirin (ASA) Cimetidine (Tagamet) Megestrol (Megace) NSAIDs (ibuprofen, naproxen, etc.) Piroxicam (Feldene) Propafenone (Rythmol SR) Propranolol (Inderal) Isoniazid (INH) Posaconazole (Noxafil) Barbiturates (Phenobarbital) Carbamazepine (Tegretol) Chlordiazepoxide (Librium) Cholestyramine (Questran) Griseofulvin Oral  Contraceptives Rifampin Sucralfate (Carafate) Vitamin K   Coumadin (Warfarin) Major Herbal Interactions  Increased Warfarin Effect Decreased Warfarin Effect  Garlic Ginseng Ginkgo biloba Coenzyme Q10 Green tea St. Johns wort    Coumadin (Warfarin) FOOD Interactions  Eat a consistent number of servings per week of foods HIGH in Vitamin K (1 serving =  cup)  Collards (cooked, or boiled & drained) Kale (cooked, or boiled & drained) Mustard greens (cooked, or boiled & drained) Parsley *serving size only =  cup Spinach (cooked, or boiled & drained) Swiss chard (cooked, or boiled & drained) Turnip greens (cooked, or boiled & drained)  Eat a consistent number of servings per week of foods MEDIUM-HIGH in Vitamin K (1 serving = 1 cup)  Asparagus (cooked, or boiled & drained) Broccoli (cooked, boiled & drained, or raw & chopped) Brussel sprouts (cooked, or boiled & drained) *serving size only =  cup Lettuce, raw (green leaf, endive, romaine) Spinach, raw Turnip greens, raw & chopped   These websites have more information on Coumadin (warfarin):  FailFactory.se; VeganReport.com.au;

## 2015-01-20 NOTE — Progress Notes (Addendum)
ANTICOAGULATION CONSULT NOTE - Initial Consult  Pharmacy Consult for warfarin dosing and monitoring Indication: atrial fibrillation  No Known Allergies  Patient Measurements: Height: 5\' 8"  (172.7 cm) Weight: 239 lb 8 oz (108.636 kg) IBW/kg (Calculated) : 68.4   Vital Signs: Temp: 97.7 F (36.5 C) (09/14 0537) Temp Source: Oral (09/14 0537) BP: 108/61 mmHg (09/14 0537) Pulse Rate: 64 (09/14 0537)  Labs:  Recent Labs  01/18/15 0456 01/19/15 0428 01/20/15 0454  HGB  --   --  11.7*  HCT  --   --  35.5*  PLT  --   --  244  LABPROT 24.5* 26.6* 28.5*  INR 2.19 2.44 2.67  CREATININE 3.58*  --  3.34*    Estimated Creatinine Clearance: 21.4 mL/min (by C-G formula based on Cr of 3.34).   Medical History: Past Medical History  Diagnosis Date  . CHF (congestive heart failure)   . BPH (benign prostatic hyperplasia)   . A-fib   . Thyroid disease   . High cholesterol     Medications:  Prescriptions prior to admission  Medication Sig Dispense Refill Last Dose  . allopurinol (ZYLOPRIM) 300 MG tablet Take 150 mg by mouth daily.  11 01/15/2015 at Unknown time  . amiodarone (PACERONE) 200 MG tablet Take 200 mg by mouth daily.  1 01/15/2015 at Unknown time  . aspirin EC 81 MG tablet Take 81 mg by mouth daily.   01/15/2015 at Unknown time  . Cholecalciferol (VITAMIN D3) 1000 UNITS CAPS Take 1,000 Units by mouth daily.   01/15/2015 at Unknown time  . furosemide (LASIX) 40 MG tablet Take 40 mg by mouth daily.  2 01/15/2015 at Unknown time  . hydrochlorothiazide (HYDRODIURIL) 25 MG tablet Take 25 mg by mouth daily.  1 01/15/2015 at Unknown time  . levothyroxine (SYNTHROID, LEVOTHROID) 150 MCG tablet Take 150 mcg by mouth daily before breakfast.  1 01/15/2015 at Unknown time  . metolazone (ZAROXOLYN) 5 MG tablet Take 5 mg by mouth daily.   01/15/2015 at Unknown time  . metroNIDAZOLE (FLAGYL) 500 MG tablet Take 1 tablet (500 mg total) by mouth every 8 (eight) hours. 24 tablet 0 01/15/2015 at 1630  .  potassium chloride SA (K-DUR,KLOR-CON) 20 MEQ tablet Take 20 mEq by mouth 2 (two) times daily.  6 01/15/2015 at Unknown time  . saccharomyces boulardii (FLORASTOR) 250 MG capsule Take 1 capsule (250 mg total) by mouth 2 (two) times daily. 20 capsule 0 01/15/2015 at Unknown time  . simvastatin (ZOCOR) 40 MG tablet Take 40 mg by mouth at bedtime.  1 01/14/2015 at Unknown time  . warfarin (COUMADIN) 3 MG tablet Take 1 tablet (3 mg total) by mouth every evening. 30 tablet 1 01/14/2015 at Unknown time    Assessment: Patient is a 81yo male who has a PMH of A.Fib, C.Diff, CKD, hypothyroidism and CHF . Patient was controlled on warfarin 3mg  for A.Fib prior to hospital admission. He is currently on metronidazole regimen for positive C. Diff toxin and positive C.diff Antigen. Pharmacy has been consulted to dose and monitor warfarin. INR 2.32, PT 25.6, and hgb 12. Pharmacy is aware metronidazole may cause an increase in patients INR.   Goal of Therapy:  INR 2-3    Plan:  Will continue patients home dose of 3mg  daily. PT-INR ordered for 0500 tomorrow. Pharmacy will continue to monitor patients INR daily and adjust warfarin as needed.   9/11 INR 2.17. Continue current regimen. INR in AM.  9/12 AM INR 2.19. Continue  current regimen. Recheck in AM.  9/13 AM INR 2.44 Continue current regimen  tonight. Recheck in AM  9/14 AM INR 2.67 Continue current regimen, 3 mg daily. Recheck tomorrow with AM labs  Olene Floss, Pharm.D Clinical Pharmacist   01/20/2015

## 2015-01-20 NOTE — Progress Notes (Signed)
Discharge instructions discussed with pt and wife. presc given and  Gone over with them. Home meds discussed.  Diet/ activity and  F/u discussed.  Legs unwrapped per nurse and md. Dressing left off  For now. Swelling down in legs. Adv. Home care to  Visit. Sl d/cd.  Pt and wife verbalize understanding of discharge plans.

## 2015-01-20 NOTE — Progress Notes (Signed)
Subjective:   Doing well overall Ambulatory Cr improved slightly to 3.34 from 3.58 and 4.08 UOP 1650 no complaint of shortness of breath  Objective:  Vital signs in last 24 hours:  Temp:  [97.6 F (36.4 C)-98.1 F (36.7 C)] 97.7 F (36.5 C) (09/14 0537) Pulse Rate:  [61-64] 64 (09/14 0537) Resp:  [18-20] 18 (09/14 0537) BP: (93-108)/(51-61) 108/61 mmHg (09/14 0537) SpO2:  [99 %-100 %] 99 % (09/14 0537) Weight:  [108.636 kg (239 lb 8 oz)] 108.636 kg (239 lb 8 oz) (09/14 0500)  Weight change: 0.59 kg (1 lb 4.8 oz) Filed Weights   01/18/15 0525 01/19/15 0500 01/20/15 0500  Weight: 108.773 kg (239 lb 12.8 oz) 108.047 kg (238 lb 3.2 oz) 108.636 kg (239 lb 8 oz)    Intake/Output: I/O last 3 completed shifts: In: -  Out: 2900 [Urine:2900]   Intake/Output this shift:     Physical Exam: General: NAD, sitting up in chair  Head: Normocephalic, atraumatic. Moist oral mucosal membranes  Eyes: Anicteric,   Neck: Supple, trachea midline  Lungs:  Mild crackles b/l at bases  Heart: Regular rate and rhythm  Abdomen:  Soft, nontender, +abdominal wall edema, Distended  Extremities:  3+ peripheral edema.  Neurologic: Nonfocal, ambulatory  Skin: No lesions  GU: +scrotal edema    Basic Metabolic Panel:  Recent Labs Lab 01/15/15 2359 01/17/15 0523 01/18/15 0456 01/19/15 1546 01/20/15 0454  NA 134* 136 136  --  138  K 3.6 3.4* 3.1* 3.4* 4.2  CL 103 104 102  --  101  CO2 24 24 25   --  28  GLUCOSE 119* 99 90  --  96  BUN 69* 76* 79*  --  80*  CREATININE 3.97* 4.08* 3.58*  --  3.34*  CALCIUM 8.6* 8.5* 8.6*  --  8.7*  MG  --   --   --  1.8 1.9    Liver Function Tests:  Recent Labs Lab 01/15/15 2359 01/18/15 0456  AST 52* 51*  ALT 21 20  ALKPHOS 107 107  BILITOT 1.0 0.6  PROT 5.1* 4.6*  ALBUMIN 2.2* 2.1*   No results for input(s): LIPASE, AMYLASE in the last 168 hours. No results for input(s): AMMONIA in the last 168 hours.  CBC:  Recent Labs Lab  01/15/15 2359 01/17/15 0523 01/20/15 0454  WBC 19.2* 8.5 7.4  NEUTROABS 17.1*  --   --   HGB 12.0* 11.6* 11.7*  HCT 36.4* 34.3* 35.5*  MCV 103.9* 103.5* 103.4*  PLT 217 221 244    Cardiac Enzymes: No results for input(s): CKTOTAL, CKMB, CKMBINDEX, TROPONINI in the last 168 hours.  BNP: Invalid input(s): POCBNP  CBG: No results for input(s): GLUCAP in the last 168 hours.  Microbiology: Results for orders placed or performed during the hospital encounter of 01/15/15  Culture, blood (routine x 2)     Status: None (Preliminary result)   Collection Time: 01/16/15  2:29 AM  Result Value Ref Range Status   Specimen Description BLOOD RIGHT ASSIST CONTROL  Final   Special Requests BOTTLES DRAWN AEROBIC AND ANAEROBIC 5ML  Final   Culture NO GROWTH 4 DAYS  Final   Report Status PENDING  Incomplete  Culture, blood (routine x 2)     Status: None (Preliminary result)   Collection Time: 01/16/15  2:29 AM  Result Value Ref Range Status   Specimen Description BLOOD LEFT HAND  Final   Special Requests BOTTLES DRAWN AEROBIC AND ANAEROBIC 5ML  Final   Culture  NO GROWTH 4 DAYS  Final   Report Status PENDING  Incomplete    Coagulation Studies:  Recent Labs  01/18/15 0456 01/19/15 0428 01/20/15 0454  LABPROT 24.5* 26.6* 28.5*  INR 2.19 2.44 2.67    Urinalysis: No results for input(s): COLORURINE, LABSPEC, PHURINE, GLUCOSEU, HGBUR, BILIRUBINUR, KETONESUR, PROTEINUR, UROBILINOGEN, NITRITE, LEUKOCYTESUR in the last 72 hours.  Invalid input(s): APPERANCEUR    Imaging: No results found.   Medications:     . allopurinol  150 mg Oral Daily  . amiodarone  200 mg Oral Daily  . aspirin EC  81 mg Oral Daily  . cholecalciferol  1,000 Units Oral Daily  . furosemide  40 mg Intravenous Q12H  . levothyroxine  150 mcg Oral QAC breakfast  . midodrine  5 mg Oral TID WC  . potassium chloride SA  20 mEq Oral BID  . simvastatin  40 mg Oral QHS  . sodium chloride  3 mL Intravenous Q12H  .  spironolactone  100 mg Oral Daily  . vancomycin  250 mg Oral 4 times per day  . warfarin  3 mg Oral QPM   acetaminophen **OR** acetaminophen, morphine injection, ondansetron **OR** ondansetron (ZOFRAN) IV  Assessment/ Plan:  Blake White is a 81 y.o. white male with congestive heart failure, atrial fibrillation, hypothyroidism, BPH, hyperlipidemia, hypertension, coronary artery disease status post CABG, who was admitted to [facility] on 01/15/2015 for C. Diff colitis.   1. Acute kidney failure on chronic kidney disease stage IV 2. Acute exacerbation of congestive heart failure: 3. Ascites, Cirrhosis of liver 4. Edema: generalized.    Plan: Acute renal failure seems to be multifactorial with prerenal azotemia and now with acute cardiorenal syndrome.   Chronic kidney disease stage IV followed by [facility].   Baseline creatinine of 2.4, eGFR of 26. - Consider changing furosemide 69m twice a day to oral. Responding well.   Add midodrine   LOS: 4 Blake White 9/14/201610:23 AM

## 2015-01-20 NOTE — Care Management Important Message (Signed)
Important Message  Patient Details  Name: Blake White MRN: [id number] Date of Birth: Mar 02, 1934   Medicare Important Message Given:  Yes-fourth notification given    Gwenette Greet, RN 01/20/2015, 2:25 PM

## 2015-01-21 LAB — CULTURE, BLOOD (ROUTINE X 2)
CULTURE: NO GROWTH
Culture: NO GROWTH

## 2015-01-29 NOTE — Discharge Summary (Signed)
[facility] - [place] at [facility]   PATIENT NAME: Blake White    MR#:  [id number]  DATE OF BIRTH:  11-28-1933  DATE OF ADMISSION:  01/15/2015 ADMITTING PHYSICIAN: Arnaldo Natal, MD  DATE OF DISCHARGE: 01/20/2015  2:53 PM  PRIMARY CARE PHYSICIAN: Sula Rumple, MD    ADMISSION DIAGNOSIS:  Leukocytosis [D72.829] Clostridium difficile diarrhea [A04.7] Acute on chronic renal failure [N17.9, N18.9] SIRS (systemic inflammatory response syndrome) [A41.9]  DISCHARGE DIAGNOSIS:  Active Problems:   Acute kidney injury   SECONDARY DIAGNOSIS:   Past Medical History  Diagnosis Date  . CHF (congestive heart failure)   . BPH (benign prostatic hyperplasia)   . A-fib   . Thyroid disease   . High cholesterol     HOSPITAL COURSE:   Assessment/Plan This is a 81 year old Caucasian male with past medical history significant for heart failure and chronic kidney disease admitted for severe C. difficile colitis and worsening renal failure. 1. C. difficile colitis:  D/Ced flagyl as clinically not better and continue oral vancomycin 250 mg for 2 weeks total  Abdominal US with liver cirrhosis  etiology probably cardiac op f/u withGI is recommended Negative hepatitis panel Patient is on Coumadin and INR is at 2.19. Appreciate GI recs  2. Acute on chronic kidney disease stage IV: Worsening of CKD stage IV sec to prerenal and cardiorenal syn .  Baseline cr is 2.4 and sees [facility] nephrology -d/c PO diuretics -  HCTZ and metolazone Resume lasix and spironolactone We will avoid nephrotoxic agents  Appreciate nephrology recommendations.   3.Acute on chronic systolic congestive heart failure: 30-35% ejection fraction on echocardiogram Strict intake and output Daily weight monitoring Fluid restriction Continue Lasix  and monitor renal function closely Spironolactone 100 mg is added to the regimen Abdominal US with liver cirrhosis which could be cardiac  cirrhosis. follow-up hepatitis panel is negative GI doesn't think enough fluid to get diagnostic paracentesis  4. A. fib: Rate controlled. Continue amiodarone and coumadin management per pharmacy INR is therapeutic at 2.19  5. Hypothyroidism: Continue Synthroid  6. PVD - Neomia Dear /wound care  6. DVT prophylaxis: as above   Pt was evaluated by PT    DISCHARGE CONDITIONS:   fair  CONSULTS OBTAINED:  Treatment Team:  Lamont Dowdy, MD Elnita Maxwell, MD Marcina Millard, MD   PROCEDURES none  DRUG ALLERGIES:  No Known Allergies  DISCHARGE MEDICATIONS:   Discharge Medication List as of 01/20/2015  1:15 PM    START taking these medications   Details  midodrine (PROAMATINE) 5 MG tablet Take 1 tablet (5 mg total) by mouth 3 (three) times daily with meals., Starting 01/20/2015, Until Discontinued, Print    spironolactone (ALDACTONE) 100 MG tablet Take 1 tablet (100 mg total) by mouth daily., Starting 01/20/2015, Until Discontinued, Print    vancomycin (VANCOCIN) 50 mg/mL oral solution Take 5 mLs (250 mg total) by mouth every 6 (six) hours., Starting 01/20/2015, Until Sat 01/30/15, Print      CONTINUE these medications which have CHANGED   Details  furosemide (LASIX) 40 MG tablet Take 1 tablet (40 mg total) by mouth 2 (two) times daily., Starting 01/20/2015, Until Discontinued, Print      CONTINUE these medications which have NOT CHANGED   Details  allopurinol (ZYLOPRIM) 300 MG tablet Take 150 mg by mouth daily., Starting 11/20/2014, Until Discontinued, Historical Med    amiodarone (PACERONE) 200 MG tablet Take 200 mg by mouth daily., Starting 10/20/2014, Until Discontinued, Historical Med  aspirin EC 81 MG tablet Take 81 mg by mouth daily., Starting 03/11/2014, Until Discontinued, Historical Med    Cholecalciferol (VITAMIN D3) 1000 UNITS CAPS Take 1,000 Units by mouth daily., Starting 03/11/2014, Until Discontinued, Historical Med    levothyroxine (SYNTHROID,  LEVOTHROID) 150 MCG tablet Take 150 mcg by mouth daily before breakfast., Starting 11/02/2014, Until Discontinued, Historical Med    potassium chloride SA (K-DUR,KLOR-CON) 20 MEQ tablet Take 20 mEq by mouth 2 (two) times daily., Starting 01/05/2015, Until Discontinued, Historical Med    saccharomyces boulardii (FLORASTOR) 250 MG capsule Take 1 capsule (250 mg total) by mouth 2 (two) times daily., Starting 01/12/2015, Until Discontinued, Normal    simvastatin (ZOCOR) 40 MG tablet Take 40 mg by mouth at bedtime., Starting 11/26/2014, Until Discontinued, Historical Med    warfarin (COUMADIN) 3 MG tablet Take 1 tablet (3 mg total) by mouth every evening., Starting 01/12/2015, Until Discontinued, Normal      STOP taking these medications     hydrochlorothiazide (HYDRODIURIL) 25 MG tablet      metolazone (ZAROXOLYN) 5 MG tablet      metroNIDAZOLE (FLAGYL) 500 MG tablet          DISCHARGE INSTRUCTIONS:   FU WITH PCP GI and nephrology as recommended Keep legs elevated Monitor daily weights Fluid restriction as recommended  DIET:  Healthy heart  DISCHARGE CONDITION:  fair  ACTIVITY:  As tolerated , as per PT recommendations  OXYGEN:  Home Oxygen: no   Oxygen Delivery: no  DISCHARGE LOCATION:  home  If you experience worsening of your admission symptoms, develop shortness of breath, life threatening emergency, suicidal or homicidal thoughts you must seek medical attention immediately by calling 911 or calling your MD immediately  if symptoms less severe.  You Must read complete instructions/literature along with all the possible adverse reactions/side effects for all the Medicines you take and that have been prescribed to you. Take any new Medicines after you have completely understood and accpet all the possible adverse reactions/side effects.   Please note  You were cared for by a hospitalist during your hospital stay. If you have any questions about your discharge medications  or the care you received while you were in the hospital after you are discharged, you can call the unit and asked to speak with the hospitalist on call if the hospitalist that took care of you is not available. Once you are discharged, your primary care physician will handle any further medical issues. Please note that NO REFILLS for any discharge medications will be authorized once you are discharged, as it is imperative that you return to your primary care physician (or establish a relationship with a primary care physician if you do not have one) for your aftercare needs so that they can reassess your need for medications and monitor your lab values.     Today  Chief Complaint  Patient presents with  . Leg Swelling  . Groin Swelling   Pt is feeling better ,LE swelling significantly improved, itchy from UNA, wants to stay off UNA for 1-2 days , diarrhea resolved. Denies any sob  ROS:  CONSTITUTIONAL: Denies fevers, chills. Denies any fatigue, weakness.  EYES: Denies blurry vision, double vision, eye pain. EARS, NOSE, THROAT: Denies tinnitus, ear pain, hearing loss. RESPIRATORY: Denies cough, wheeze, shortness of breath.  CARDIOVASCULAR: Denies chest pain, palpitations, edema.  GASTROINTESTINAL: Denies nausea, vomiting, diarrhea, abdominal pain. Denies bright red blood per rectum. GENITOURINARY: Denies dysuria, hematuria. ENDOCRINE: Denies nocturia or thyroid problems.  HEMATOLOGIC AND LYMPHATIC: Denies easy bruising or bleeding. SKIN: Denies rash or lesion. MUSCULOSKELETAL: Denies pain in neck, back, shoulder, knees, hips or arthritic symptoms.  NEUROLOGIC: Denies paralysis, paresthesias.  PSYCHIATRIC: Denies anxiety or depressive symptoms.   VITAL SIGNS:  Blood pressure 108/61, pulse 64, temperature 97.7 F (36.5 C), temperature source Oral, resp. rate 18, height  (1.727 m), weight 108.636 kg (239 lb 8 oz), SpO2 99 %.  I/O:  No intake or output data in the 24 hours ending  01/29/15 1830  PHYSICAL EXAMINATION:  GENERAL:  81 y.o.-year-old patient lying in the bed with no acute distress.  EYES: Pupils equal, round, reactive to light and accommodation. No scleral icterus. Extraocular muscles intact.  HEENT: Head atraumatic, normocephalic. Oropharynx and nasopharynx clear.  NECK:  Supple, no jugular venous distention. No thyroid enlargement, no tenderness.  LUNGS: Normal breath sounds bilaterally, no wheezing, rales,rhonchi or crepitation. No use of accessory muscles of respiration.  CARDIOVASCULAR: S1, S2 normal. No murmurs, rubs, or gallops.  ABDOMEN: Soft, non-tender, non-distended. Bowel sounds present. No organomegaly or mass.  EXTREMITIES: No pedal edema, cyanosis, or clubbing.  NEUROLOGIC: Cranial nerves II through XII are intact. Muscle strength 5/5 in all extremities. Sensation intact. Gait not checked.  PSYCHIATRIC: The patient is alert and oriented x 3.  SKIN: No obvious rash, lesion, or ulcer.   DATA REVIEW:   CBC No results for input(s): WBC, HGB, HCT, PLT in the last 168 hours.  Chemistries  No results for input(s): NA, K, CL, CO2, GLUCOSE, BUN, CREATININE, CALCIUM, MG, AST, ALT, ALKPHOS, BILITOT in the last 168 hours.  Invalid input(s): GFRCGP  Cardiac Enzymes No results for input(s): TROPONINI in the last 168 hours.  Microbiology Results  Results for orders placed or performed during the hospital encounter of 01/15/15  Culture, blood (routine x 2)     Status: None   Collection Time: 01/16/15  2:29 AM  Result Value Ref Range Status   Specimen Description BLOOD RIGHT ASSIST CONTROL  Final   Special Requests BOTTLES DRAWN AEROBIC AND ANAEROBIC  Final   Culture NO GROWTH 5 DAYS  Final   Report Status 01/21/2015 FINAL  Final  Culture, blood (routine x 2)     Status: None   Collection Time: 01/16/15  2:29 AM  Result Value Ref Range Status   Specimen Description BLOOD LEFT HAND  Final   Special Requests BOTTLES DRAWN AEROBIC AND  ANAEROBIC  Final   Culture NO GROWTH 5 DAYS  Final   Report Status 01/21/2015 FINAL  Final    RADIOLOGY:  No results found.  EKG:   Orders placed or performed during the hospital encounter of 01/15/15  . EKG 12-Lead  . EKG 12-Lead  . EKG      Management plans discussed with the patient, family and they are in agreement.  CODE STATUS:   TOTAL TIME TAKING CARE OF THIS PATIENT: 45 minutes.    @  on 01/29/2015 at 6:30 PM  Between 7am to 6pm - Pager - [phone]  After 6pm go to [email or web address] - password [email or web address] [facility]  [facility]  Office  [phone]  CC: Primary care physician; Sula Rumple, MD

## 2015-02-13 ENCOUNTER — Emergency Department
Admission: EM | Admit: 2015-02-13 | Discharge: 2015-02-13 | Disposition: A | Discharge status: Home / Self Care | Payer: Medicare Other | Attending: Emergency Medicine | Admitting: Emergency Medicine

## 2015-02-13 ENCOUNTER — Encounter: Payer: Self-pay | Admitting: *Deleted

## 2015-02-13 DIAGNOSIS — Z87891 Personal history of nicotine dependence: Secondary | ICD-10-CM | POA: Insufficient documentation

## 2015-02-13 DIAGNOSIS — Z7982 Long term (current) use of aspirin: Secondary | ICD-10-CM | POA: Insufficient documentation

## 2015-02-13 DIAGNOSIS — Z7901 Long term (current) use of anticoagulants: Secondary | ICD-10-CM | POA: Insufficient documentation

## 2015-02-13 DIAGNOSIS — R600 Localized edema: Secondary | ICD-10-CM | POA: Diagnosis not present

## 2015-02-13 DIAGNOSIS — E860 Dehydration: Secondary | ICD-10-CM | POA: Diagnosis not present

## 2015-02-13 DIAGNOSIS — Z79899 Other long term (current) drug therapy: Secondary | ICD-10-CM | POA: Diagnosis not present

## 2015-02-13 DIAGNOSIS — R531 Weakness: Secondary | ICD-10-CM | POA: Diagnosis present

## 2015-02-13 LAB — COMPREHENSIVE METABOLIC PANEL
ALT: 31 U/L (ref 17–63)
ANION GAP: 6 (ref 5–15)
AST: 62 U/L — ABNORMAL HIGH (ref 15–41)
Albumin: 2.7 g/dL — ABNORMAL LOW (ref 3.5–5.0)
Alkaline Phosphatase: 86 U/L (ref 38–126)
BUN: 51 mg/dL — ABNORMAL HIGH (ref 6–20)
CHLORIDE: 110 mmol/L (ref 101–111)
CO2: 21 mmol/L — AB (ref 22–32)
Calcium: 9.7 mg/dL (ref 8.9–10.3)
Creatinine, Ser: 2.93 mg/dL — ABNORMAL HIGH (ref 0.61–1.24)
GFR calc non Af Amer: 19 mL/min — ABNORMAL LOW (ref >60)
GFR, EST AFRICAN AMERICAN: 22 mL/min — AB (ref >60)
Glucose, Bld: 113 mg/dL — ABNORMAL HIGH (ref 65–99)
POTASSIUM: 5.4 mmol/L — AB (ref 3.5–5.1)
SODIUM: 137 mmol/L (ref 135–145)
Total Bilirubin: 1.3 mg/dL — ABNORMAL HIGH (ref 0.3–1.2)
Total Protein: 6.1 g/dL — ABNORMAL LOW (ref 6.5–8.1)

## 2015-02-13 LAB — URINALYSIS COMPLETE WITH MICROSCOPIC (ARMC ONLY)
Bilirubin Urine: NEGATIVE
Glucose, UA: NEGATIVE mg/dL
HGB URINE DIPSTICK: NEGATIVE
Ketones, ur: NEGATIVE mg/dL
LEUKOCYTES UA: NEGATIVE
NITRITE: NEGATIVE
PH: 7 (ref 5.0–8.0)
Protein, ur: NEGATIVE mg/dL
RBC / HPF: NONE SEEN RBC/hpf (ref 0–5)
SPECIFIC GRAVITY, URINE: 1.014 (ref 1.005–1.030)

## 2015-02-13 LAB — CBC WITH DIFFERENTIAL/PLATELET
Basophils Absolute: 0.1 10*3/uL (ref 0–0.1)
Basophils Relative: 1 %
EOS ABS: 0.1 10*3/uL (ref 0–0.7)
EOS PCT: 2 %
HCT: 36.6 % — ABNORMAL LOW (ref 40.0–52.0)
Hemoglobin: 12 g/dL — ABNORMAL LOW (ref 13.0–18.0)
LYMPHS ABS: 0.7 10*3/uL — AB (ref 1.0–3.6)
Lymphocytes Relative: 14 %
MCH: 33.9 pg (ref 26.0–34.0)
MCHC: 32.8 g/dL (ref 32.0–36.0)
MCV: 103.3 fL — ABNORMAL HIGH (ref 80.0–100.0)
MONO ABS: 0.7 10*3/uL (ref 0.2–1.0)
MONOS PCT: 12 %
Neutro Abs: 3.7 10*3/uL (ref 1.4–6.5)
Neutrophils Relative %: 71 %
PLATELETS: 148 10*3/uL — AB (ref 150–440)
RBC: 3.54 MIL/uL — AB (ref 4.40–5.90)
RDW: 16.6 % — AB (ref 11.5–14.5)
WBC: 5.3 10*3/uL (ref 3.8–10.6)

## 2015-02-13 LAB — TROPONIN I

## 2015-02-13 MED ORDER — SODIUM CHLORIDE 0.9 % IV BOLUS (SEPSIS)
500.0000 mL | Freq: Once | INTRAVENOUS | Status: AC
  Administered 2015-02-13: 500 mL via INTRAVENOUS

## 2015-02-13 MED ORDER — SODIUM POLYSTYRENE SULFONATE 15 GM/60ML PO SUSP
30.0000 g | Freq: Once | ORAL | Status: AC
  Administered 2015-02-13: 30 g via ORAL
  Filled 2015-02-13: qty 120

## 2015-02-13 NOTE — ED Notes (Signed)
Pt reports he feels like he is dehydrated. Pt vomited x 1 a week ago. Pt able to eat and drink now. Home health nurse stated yesterday she believed he was dehydrated. Denies pain.

## 2015-02-13 NOTE — ED Provider Notes (Addendum)
[facility] Emergency Department Provider Note REMINDER - THIS NOTE IS NOT A FINAL MEDICAL RECORD UNTIL IT IS SIGNED. UNTIL THEN, THE CONTENT BELOW MAY REFLECT INFORMATION FROM A DOCUMENTATION TEMPLATE, NOT THE ACTUAL PATIENT VISIT. ____________________________________________  Time seen: Approximately 6:10 PM  I have reviewed the triage vital signs and the nursing notes.   HISTORY  Chief Complaint Dehydration    HPI Blake White is a 81 y.o. male  with a previous history of atrial fibrillation, congestive heart failure, history of diffuse ill. He also has a history of chronic kidney disease.  The patient as family reports that for about the last 1-2 days he's been very fatigued. He still able to walk and uses walker, but he reports feeling very tired. He feels slightly dry and dehydrated. Reports he just doesn't have a lot of desire to drink much water, but he can "eat like a horse" for food.  Family reports this is happened to him before and he was thought to be dehydrated. He said any fevers or chills. No chest pain or headache. No diarrhea. He doesn't a history of Clostridium difficile, but he has not had any symptoms of this. No abdominal pain. He does have lower extremity swelling and venous ulcers, but is followed closely by vascular surgery for this is not having any present issues.  Does also report he has a home health aide who saw him yesterday and felt that he was dehydrated at that time.   Past Medical History  Diagnosis Date  . CHF (congestive heart failure) (HCC)   . BPH (benign prostatic hyperplasia)   . A-fib (HCC)   . Thyroid disease   . High cholesterol     Patient Active Problem List   Diagnosis Date Noted  . Acute kidney injury (HCC) 01/16/2015  . Abdominal pain 01/10/2015  . Diarrhea 01/10/2015    Past Surgical History  Procedure Laterality Date  . Vein bypass surgery    . Vascular surgery    . Cardiac surgery      quad  bypass    Current Outpatient Rx  Name  Route  Sig  Dispense  Refill  . allopurinol (ZYLOPRIM) 300 MG tablet   Oral   Take 150 mg by mouth daily.      11   . amiodarone (PACERONE) 200 MG tablet   Oral   Take 200 mg by mouth daily.      1   . aspirin EC 81 MG tablet   Oral   Take 81 mg by mouth daily.         . Cholecalciferol (VITAMIN D3) 1000 UNITS CAPS   Oral   Take 1,000 Units by mouth daily.         . furosemide (LASIX) 40 MG tablet   Oral   Take 1 tablet (40 mg total) by mouth 2 (two) times daily.   60 tablet   0   . levothyroxine (SYNTHROID, LEVOTHROID) 150 MCG tablet   Oral   Take 150 mcg by mouth daily before breakfast.      1   . midodrine (PROAMATINE) 5 MG tablet   Oral   Take 1 tablet (5 mg total) by mouth 3 (three) times daily with meals.   90 tablet   0   . potassium chloride SA (K-DUR,KLOR-CON) 20 MEQ tablet   Oral   Take 20 mEq by mouth 2 (two) times daily.      6   . saccharomyces  boulardii (FLORASTOR) 250 MG capsule   Oral   Take 1 capsule (250 mg total) by mouth 2 (two) times daily.   20 capsule   0   . simvastatin (ZOCOR) 40 MG tablet   Oral   Take 40 mg by mouth at bedtime.      1   . spironolactone (ALDACTONE) 100 MG tablet   Oral   Take 1 tablet (100 mg total) by mouth daily.   30 tablet   0   . warfarin (COUMADIN) 3 MG tablet   Oral   Take 1 tablet (3 mg total) by mouth every evening.   30 tablet   1     Allergies Review of patient's allergies indicates no known allergies.  Family History  Problem Relation Age of Onset  . Diabetes Mellitus II Mother   . Lung cancer Brother     Social History Social History  Substance Use Topics  . Smoking status: Former Games developer  . Smokeless tobacco: None  . Alcohol Use: No    Review of Systems Constitutional: No fever/chills Eyes: No visual changes. ENT: No sore throat. Cardiovascular: Denies chest pain. Respiratory: Denies shortness of breath. Gastrointestinal:  No abdominal pain.  No nausea, no vomiting.  No diarrhea.  No constipation. Genitourinary: Negative for dysuria. Musculoskeletal: Negative for back pain. Skin: Negative for rash. Neurological: Negative for headaches, focal weakness or numbness. He feels generally tired.  10-point ROS otherwise negative.  ____________________________________________   PHYSICAL EXAM:  VITAL SIGNS: ED Triage Vitals  Enc Vitals Group     BP 02/13/15 1317 120/50 mmHg     Pulse Rate 02/13/15 1317 62     Resp 02/13/15 1317 16     Temp 02/13/15 1317 98.3 F (36.8 C)     Temp Source 02/13/15 1317 Oral     SpO2 02/13/15 1317 99 %     Weight 02/13/15 1317 215 lb (97.523 kg)     Height 02/13/15 1317  (1.727 m)     Head Cir --      Peak Flow --      Pain Score --      Pain Loc --      Pain Edu? --      Excl. in GC? --    Constitutional: Alert and oriented. Fatigued appearing and in no acute distress. Eyes: Conjunctivae are normal. PERRL. EOMI. Head: Atraumatic. Nose: No congestion/rhinnorhea. Mouth/Throat: Mucous membranes are slightly dry.  Oropharynx non-erythematous. Neck: No stridor.   Cardiovascular: Irreg rate, irreg rhythm. Grossly normal heart sounds.  Good peripheral circulation. Respiratory: Normal respiratory effort.  No retractions. Lungs CTAB. Gastrointestinal: Soft and nontender. No distention. No abdominal bruits. No CVA tenderness. Musculoskeletal: No lower extremity tenderness though he does have 2+ lower extremity edema bilaterally with bandages over his lower extremities.  No joint effusions. Neurologic:  Normal speech and language. No gross focal neurologic deficits are appreciated. No pronator drift. Speaks in clear and coherent sentences. He is oriented to year date and time.  Skin:  Skin is warm, dry and intact. No rash noted. Psychiatric: Mood and affect are normal. Speech and behavior are normal.  ____________________________________________   LABS (all labs ordered  are listed, but only abnormal results are displayed)  Labs Reviewed  CBC WITH DIFFERENTIAL/PLATELET - Abnormal; Notable for the following:    RBC 3.54 (*)    Hemoglobin 12.0 (*)    HCT 36.6 (*)    MCV 103.3 (*)    RDW 16.6 (*)  Platelets 148 (*)    Lymphs Abs 0.7 (*)    All other components within normal limits  COMPREHENSIVE METABOLIC PANEL - Abnormal; Notable for the following:    Potassium 5.4 (*)    CO2 21 (*)    Glucose, Bld 113 (*)    BUN 51 (*)    Creatinine, Ser 2.93 (*)    Total Protein 6.1 (*)    Albumin 2.7 (*)    AST 62 (*)    Total Bilirubin 1.3 (*)    GFR calc non Af Amer 19 (*)    GFR calc Af Amer 22 (*)    All other components within normal limits  URINALYSIS COMPLETEWITH MICROSCOPIC ([facility] ONLY) - Abnormal; Notable for the following:    Color, Urine YELLOW (*)    APPearance CLEAR (*)    Bacteria, UA RARE (*)    Squamous Epithelial / LPF 0-5 (*)    All other components within normal limits  TROPONIN I   ____________________________________________  EKG  Reviewed and interpreted by me Ventricular rate 55 Probable sinus rhythm with first-degree AV block 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 ____________________________________________  RADIOLOGY  Initially no indication for CT of the head. No new neurologic abnormalities. Reassuring examination. Patient is not complaining of any chest symptoms, no cough, no fever, no difficulty breathing. ____________________________________________   PROCEDURES  Procedure(s) performed: None  Critical Care performed: No  ____________________________________________   INITIAL IMPRESSION / ASSESSMENT AND PLAN / ED COURSE  Pertinent labs & imaging results that were available during my care of the patient were reviewed by me and considered in my medical decision making (see chart for  details).    The patient says for generalized weakness and feeling slightly dehydrated over the last 24-48 hours. He does appear moderately dehydrated with dry mucous membranes but also has notable lower extremity edema which appears to be chronic in nature due to his congestive heart failure. Endorse any acute neurologic or cardiac symptoms. No infectious symptoms.   Labs reassuring with baseline poor renal function. His potassium however is slightly elevated, he is on twice daily potassium repletion and I have discussed with him stopping this until he can get close follow-up for repeat test this week.   Plan to hydrate him, told his potassium and discussed with his cardiologist to establish close follow-up care this week. ____________________________________________  161W discussed with Dr. Lady Gary reviewed labs, EKG, and clinical exam. Agrees with hold lasix for now, also hold potassium pills. Hydrate slightly in the ER, and then discharge to follow up on Monday. Dr. Lady Gary will have his clinic reach out to him.  The patient is family are agreeable with this plan. He is in no distress, does not of any shortness of breath. Normal oxygen saturation. Suspect he is probably slightly hypovolemic and we will hold his Lasix and potassium pills. He will follow-up with Dr. Lady Gary on Monday for repeat blood work and reexam. He'll return to the ER right away if he develops any shortness of breath, fevers, chills, worsening of his condition or other new concerns arise.   FINAL CLINICAL IMPRESSION(S) / ED DIAGNOSES  Final diagnoses:  Dehydration, mild      Sharyn Creamer, MD 02/13/15 1940  ----------------------------------------- 7:46 PM on 02/13/2015 -----------------------------------------  Patient eating a meal. Reports he feels much better. Understands plan of care and will be discharged after completing fluids.  Sharyn Creamer, MD 02/13/15 1946

## 2015-02-13 NOTE — ED Notes (Signed)
Daughter states some confusion, states this AM pt took his medication and then later on asked for it again

## 2015-02-13 NOTE — Discharge Instructions (Signed)
Please stop your Lasix and potassium pills for the next 2 days and follow-up with cardiology Monday. Please call Monday morning and Dr. Lady Gary will be able to see you on Monday or Tuesday this week. Return to the emergency room right away if you develop shortness of breath, increased weakness, fever, chest pain, or other new concerns arise.  Dehydration, Adult Dehydration is a condition in which you do not have enough fluid or water in your body. It happens when you take in less fluid than you lose. Vital organs such as the kidneys, brain, and heart cannot function without a proper amount of fluids. Any loss of fluids from the body can cause dehydration.  Dehydration can range from mild to severe. This condition should be treated right away to help prevent it from becoming severe. CAUSES  This condition may be caused by:  Vomiting.  Diarrhea.  Excessive sweating, such as when exercising in hot or humid weather.  Not drinking enough fluid during strenuous exercise or during an illness.  Excessive urine output.  Fever.  Certain medicines. RISK FACTORS This condition is more likely to develop in:  People who are taking certain medicines that cause the body to lose excess fluid (diuretics).   People who have a chronic illness, such as diabetes, that may increase urination.  Older adults.   People who live at high altitudes.   People who participate in endurance sports.  SYMPTOMS  Mild Dehydration  Thirst.  Dry lips.  Slightly dry mouth.  Dry, warm skin. Moderate Dehydration  Very dry mouth.   Muscle cramps.   Dark urine and decreased urine production.   Decreased tear production.   Headache.   Light-headedness, especially when you stand up from a sitting position.  Severe Dehydration  Changes in skin.   Cold and clammy skin.   Skin does not spring back quickly when lightly pinched and released.   Changes in body fluids.   Extreme thirst.    No tears.   Not able to sweat when body temperature is high, such as in hot weather.   Minimal urine production.   Changes in vital signs.   Rapid, weak pulse (more than 100 beats per minute when you are sitting still).   Rapid breathing.   Low blood pressure.   Other changes.   Sunken eyes.   Cold hands and feet.   Confusion.  Lethargy and difficulty being awakened.  Fainting (syncope).   Short-term weight loss.   Unconsciousness. DIAGNOSIS  This condition may be diagnosed based on your symptoms. You may also have tests to determine how severe your dehydration is. These tests may include:   Urine tests.   Blood tests.  TREATMENT  Treatment for this condition depends on the severity. Mild or moderate dehydration can often be treated at home. Treatment should be started right away. Do not wait until dehydration becomes severe. Severe dehydration needs to be treated at the hospital. Treatment for Mild Dehydration  Drinking plenty of water to replace the fluid you have lost.   Replacing minerals in your blood (electrolytes) that you may have lost.  Treatment for Moderate Dehydration  Consuming oral rehydration solution (ORS). Treatment for Severe Dehydration  Receiving fluid through an IV tube.   Receiving electrolyte solution through a feeding tube that is passed through your nose and into your stomach (nasogastric tube or NG tube).  Correcting any abnormalities in electrolytes. HOME CARE INSTRUCTIONS   Drink enough fluid to keep your urine clear or  pale yellow.   Drink water or fluid slowly by taking small sips. You can also try sucking on ice cubes.  Have food or beverages that contain electrolytes. Examples include bananas and sports drinks.  Take over-the-counter and prescription medicines only as told by your health care provider.   Prepare ORS according to the manufacturer's instructions. Take sips of ORS every 5 minutes  until your urine returns to normal.  If you have vomiting or diarrhea, continue to try to drink water, ORS, or both.   If you have diarrhea, avoid:   Beverages that contain caffeine.   Fruit juice.   Milk.   Carbonated soft drinks.  Do not take salt tablets. This can lead to the condition of having too much sodium in your body (hypernatremia).  SEEK MEDICAL CARE IF:  You cannot eat or drink without vomiting.  You have had moderate diarrhea during a period of more than 24 hours.  You have a fever. SEEK IMMEDIATE MEDICAL CARE IF:   You have extreme thirst.  You have severe diarrhea.  You have not urinated in 6-8 hours, or you have urinated only a small amount of very dark urine.  You have shriveled skin.  You are dizzy, confused, or both.   This information is not intended to replace advice given to you by your health care provider. Make sure you discuss any questions you have with your health care provider.   Document Released: 04/24/2005 Document Revised: 01/13/2015 Document Reviewed: 09/09/2014 Elsevier Interactive Patient Education [employer]

## 2015-02-19 ENCOUNTER — Emergency Department
Admission: EM | Admit: 2015-02-19 | Discharge: 2015-02-20 | Disposition: A | Discharge status: Home / Self Care | Payer: Medicare Other | Attending: Emergency Medicine | Admitting: Emergency Medicine

## 2015-02-19 ENCOUNTER — Encounter: Payer: Self-pay | Admitting: *Deleted

## 2015-02-19 ENCOUNTER — Emergency Department: Payer: Medicare Other

## 2015-02-19 DIAGNOSIS — Z87891 Personal history of nicotine dependence: Secondary | ICD-10-CM | POA: Diagnosis not present

## 2015-02-19 DIAGNOSIS — L819 Disorder of pigmentation, unspecified: Secondary | ICD-10-CM | POA: Diagnosis not present

## 2015-02-19 DIAGNOSIS — A047 Enterocolitis due to Clostridium difficile: Secondary | ICD-10-CM | POA: Diagnosis not present

## 2015-02-19 DIAGNOSIS — R197 Diarrhea, unspecified: Secondary | ICD-10-CM | POA: Diagnosis present

## 2015-02-19 DIAGNOSIS — Z7982 Long term (current) use of aspirin: Secondary | ICD-10-CM | POA: Diagnosis not present

## 2015-02-19 DIAGNOSIS — Z79899 Other long term (current) drug therapy: Secondary | ICD-10-CM | POA: Diagnosis not present

## 2015-02-19 DIAGNOSIS — Z7901 Long term (current) use of anticoagulants: Secondary | ICD-10-CM | POA: Insufficient documentation

## 2015-02-19 DIAGNOSIS — A0472 Enterocolitis due to Clostridium difficile, not specified as recurrent: Secondary | ICD-10-CM

## 2015-02-19 LAB — COMPREHENSIVE METABOLIC PANEL
ALBUMIN: 2.5 g/dL — AB (ref 3.5–5.0)
ALT: 31 U/L (ref 17–63)
AST: 44 U/L — AB (ref 15–41)
Alkaline Phosphatase: 88 U/L (ref 38–126)
Anion gap: 4 — ABNORMAL LOW (ref 5–15)
BUN: 48 mg/dL — AB (ref 6–20)
CO2: 21 mmol/L — ABNORMAL LOW (ref 22–32)
CREATININE: 2.78 mg/dL — AB (ref 0.61–1.24)
Calcium: 9 mg/dL (ref 8.9–10.3)
Chloride: 108 mmol/L (ref 101–111)
GFR calc Af Amer: 23 mL/min — ABNORMAL LOW (ref >60)
GFR, EST NON AFRICAN AMERICAN: 20 mL/min — AB (ref >60)
GLUCOSE: 104 mg/dL — AB (ref 65–99)
POTASSIUM: 4.6 mmol/L (ref 3.5–5.1)
Sodium: 133 mmol/L — ABNORMAL LOW (ref 135–145)
Total Bilirubin: 1.3 mg/dL — ABNORMAL HIGH (ref 0.3–1.2)
Total Protein: 5.6 g/dL — ABNORMAL LOW (ref 6.5–8.1)

## 2015-02-19 LAB — URINALYSIS COMPLETE WITH MICROSCOPIC (ARMC ONLY)
BACTERIA UA: NONE SEEN
Bilirubin Urine: NEGATIVE
GLUCOSE, UA: NEGATIVE mg/dL
Hgb urine dipstick: NEGATIVE
Ketones, ur: NEGATIVE mg/dL
LEUKOCYTES UA: NEGATIVE
Nitrite: NEGATIVE
PROTEIN: NEGATIVE mg/dL
SPECIFIC GRAVITY, URINE: 1.012 (ref 1.005–1.030)
SQUAMOUS EPITHELIAL / LPF: NONE SEEN
pH: 5 (ref 5.0–8.0)

## 2015-02-19 LAB — LIPASE, BLOOD: LIPASE: 40 U/L (ref 22–51)

## 2015-02-19 LAB — C DIFFICILE QUICK SCREEN W PCR REFLEX
C DIFFICILE (CDIFF) INTERP: POSITIVE
C DIFFICLE (CDIFF) ANTIGEN: POSITIVE — AB
C Diff toxin: POSITIVE — AB

## 2015-02-19 LAB — CBC
HEMATOCRIT: 34.4 % — AB (ref 40.0–52.0)
Hemoglobin: 11.5 g/dL — ABNORMAL LOW (ref 13.0–18.0)
MCH: 34.5 pg — AB (ref 26.0–34.0)
MCHC: 33.4 g/dL (ref 32.0–36.0)
MCV: 103.4 fL — AB (ref 80.0–100.0)
PLATELETS: 138 10*3/uL — AB (ref 150–440)
RBC: 3.33 MIL/uL — ABNORMAL LOW (ref 4.40–5.90)
RDW: 16.4 % — AB (ref 11.5–14.5)
WBC: 8.6 10*3/uL (ref 3.8–10.6)

## 2015-02-19 LAB — PROTIME-INR
INR: 2.06
PROTHROMBIN TIME: 23.4 s — AB (ref 11.4–15.0)

## 2015-02-19 LAB — TROPONIN I
TROPONIN I: 0.05 ng/mL — AB (ref <0.031)
Troponin I: <0.03 ng/mL (ref <0.031)

## 2015-02-19 MED ORDER — SACCHAROMYCES BOULARDII 250 MG PO CAPS: 250.0000 mg | ORAL_CAPSULE | Freq: Two times a day (BID) | ORAL | Status: AC

## 2015-02-19 MED ORDER — VANCOMYCIN 50 MG/ML ORAL SOLUTION: 125.0000 mg | Freq: Four times a day (QID) | ORAL | Status: DC

## 2015-02-19 MED ORDER — SODIUM CHLORIDE 0.9 % IV BOLUS (SEPSIS)
500.0000 mL | Freq: Once | INTRAVENOUS | Status: AC
  Administered 2015-02-19: 500 mL via INTRAVENOUS

## 2015-02-19 MED ORDER — VANCOMYCIN HCL 125 MG PO CAPS: 125.0000 mg | ORAL_CAPSULE | Freq: Four times a day (QID) | ORAL | Status: DC

## 2015-02-19 MED ORDER — VANCOMYCIN 50 MG/ML ORAL SOLUTION
125.0000 mg | Freq: Once | ORAL | Status: AC
  Administered 2015-02-19: 125 mg via ORAL
  Filled 2015-02-19: qty 2.5

## 2015-02-19 NOTE — ED Notes (Signed)
Just got a call from nursing supervisor.  Unable to locate unna boot.  She is going to bring large vasoline gauze.   Patient is aware of plan and aware of the delay

## 2015-02-19 NOTE — ED Notes (Signed)
Pt reports ongoing diarrhea since last admission. No pain. Last 24 hours 5 episodes.

## 2015-02-19 NOTE — ED Notes (Signed)
MD at bedside. 

## 2015-02-19 NOTE — ED Provider Notes (Signed)
[facility] Emergency Department Provider Note  ____________________________________________  Time seen: Approximately 3:30 PM  I have reviewed the triage vital signs and the nursing notes.   HISTORY  Chief Complaint Diarrhea    HPI Blake White is a 81 y.o. male with a history of C. difficile who is presenting today with 5 episodes of diarrhea over the past 24 hours.  He says the diarrhea has been brown and he thinks he may have seen some small streaks of blood as well. He denies any recent antibiotics but does say he recently started a medicine for his reflux which his wife said she saw C death being a possible side effect. He denies any pain at this time. Denies any nausea or vomiting. The wife says that he had some mild confusion this morning but is now acting like himself. Denies any focal weakness or numbness. Says that this morning he was not able to make it to the bathroom before soiling himself because of his diarrhea.   Past Medical History  Diagnosis Date  . CHF (congestive heart failure) (HCC)   . BPH (benign prostatic hyperplasia)   . A-fib (HCC)   . Thyroid disease   . High cholesterol     Patient Active Problem List   Diagnosis Date Noted  . Acute kidney injury (HCC) 01/16/2015  . Abdominal pain 01/10/2015  . Diarrhea 01/10/2015    Past Surgical History  Procedure Laterality Date  . Vein bypass surgery    . Vascular surgery    . Cardiac surgery      quad bypass    Current Outpatient Rx  Name  Route  Sig  Dispense  Refill  . allopurinol (ZYLOPRIM) 300 MG tablet   Oral   Take 150 mg by mouth daily.      11   . amiodarone (PACERONE) 200 MG tablet   Oral   Take 200 mg by mouth daily.      1   . aspirin EC 81 MG tablet   Oral   Take 81 mg by mouth daily.         . Cholecalciferol (VITAMIN D3) 1000 UNITS CAPS   Oral   Take 1,000 Units by mouth daily.         . furosemide (LASIX) 40 MG tablet   Oral   Take 1  tablet (40 mg total) by mouth 2 (two) times daily.   60 tablet   0   . levothyroxine (SYNTHROID, LEVOTHROID) 150 MCG tablet   Oral   Take 150 mcg by mouth daily before breakfast.      1   . midodrine (PROAMATINE) 5 MG tablet   Oral   Take 1 tablet (5 mg total) by mouth 3 (three) times daily with meals.   90 tablet   0   . potassium chloride SA (K-DUR,KLOR-CON) 20 MEQ tablet   Oral   Take 20 mEq by mouth 2 (two) times daily.      6   . saccharomyces boulardii (FLORASTOR) 250 MG capsule   Oral   Take 1 capsule (250 mg total) by mouth 2 (two) times daily.   20 capsule   0   . simvastatin (ZOCOR) 40 MG tablet   Oral   Take 40 mg by mouth at bedtime.      1   . spironolactone (ALDACTONE) 100 MG tablet   Oral   Take 1 tablet (100 mg total) by mouth daily.   30 tablet  0   . warfarin (COUMADIN) 3 MG tablet   Oral   Take 1 tablet (3 mg total) by mouth every evening.   30 tablet   1     Allergies Review of patient's allergies indicates no known allergies.  Family History  Problem Relation Age of Onset  . Diabetes Mellitus II Mother   . Lung cancer Brother     Social History Social History  Substance Use Topics  . Smoking status: Former Games developer  . Smokeless tobacco: None  . Alcohol Use: No    Review of Systems Constitutional: No fever/chills Eyes: No visual changes. ENT: No sore throat. Cardiovascular: Denies chest pain. Respiratory: Denies shortness of breath. Gastrointestinal: No abdominal pain.  No nausea, no vomiting.    No constipation. Genitourinary: Negative for dysuria. Musculoskeletal: Negative for back pain. Skin: Negative for rash. Neurological: Negative for headaches, focal weakness or numbness.  10-point ROS otherwise negative.  ____________________________________________   PHYSICAL EXAM:  VITAL SIGNS: ED Triage Vitals  Enc Vitals Group     BP 02/19/15 1508 105/53 mmHg     Pulse Rate 02/19/15 1508 61     Resp 02/19/15 1508  16     Temp 02/19/15 1508 98.3 F (36.8 C)     Temp src --      SpO2 02/19/15 1508 100 %     Weight 02/19/15 1508 216 lb (97.977 kg)     Height 02/19/15 1508  (1.727 m)     Head Cir --      Peak Flow --      Pain Score --      Pain Loc --      Pain Edu? --      Excl. in GC? --     Constitutional: Alert and oriented. Well appearing and in no acute distress. Eyes: Conjunctivae are normal. PERRL. EOMI. Head: Atraumatic. Nose: No congestion/rhinnorhea. Mouth/Throat: Mucous membranes are moist.   Neck: No stridor.   Cardiovascular: Normal rate, regular rhythm. Grossly normal heart sounds.  Good peripheral circulation. Respiratory: Normal respiratory effort.  No retractions. Lungs CTAB. Gastrointestinal: Soft and nontender. No distention. No abdominal bruits. No CVA tenderness. Musculoskeletal: Unna boot removed to the bilateral lower extremities. Patient with hyperpigmentation which appears chronic. However, no open wounds or weeping sores. No tenderness, induration or pus. There is mild edema to the bilateral lower extremities.  Neurologic:  Normal speech and language. No gross focal neurologic deficits are appreciated. No gait instability. Skin:  Skin is warm, dry and intact. No rash noted. Psychiatric: Mood and affect are normal. Speech and behavior are normal.  ____________________________________________   LABS (all labs ordered are listed, but only abnormal results are displayed)  Labs Reviewed  C DIFFICILE QUICK SCREEN W PCR REFLEX - Abnormal; Notable for the following:    C Diff antigen POSITIVE (*)    C Diff toxin POSITIVE (*)    All other components within normal limits  COMPREHENSIVE METABOLIC PANEL - Abnormal; Notable for the following:    Sodium 133 (*)    CO2 21 (*)    Glucose, Bld 104 (*)    BUN 48 (*)    Creatinine, Ser 2.78 (*)    Total Protein 5.6 (*)    Albumin 2.5 (*)    AST 44 (*)    Total Bilirubin 1.3 (*)    GFR calc non Af Amer 20 (*)    GFR  calc Af Amer 23 (*)    Anion gap 4 (*)  All other components within normal limits  CBC - Abnormal; Notable for the following:    RBC 3.33 (*)    Hemoglobin 11.5 (*)    HCT 34.4 (*)    MCV 103.4 (*)    MCH 34.5 (*)    RDW 16.4 (*)    Platelets 138 (*)    All other components within normal limits  URINALYSIS COMPLETEWITH MICROSCOPIC ([facility] ONLY) - Abnormal; Notable for the following:    Color, Urine YELLOW (*)    APPearance CLEAR (*)    All other components within normal limits  TROPONIN I - Abnormal; Notable for the following:    Troponin I 0.05 (*)    All other components within normal limits  PROTIME-INR - Abnormal; Notable for the following:    Prothrombin Time 23.4 (*)    All other components within normal limits  LIPASE, BLOOD  TROPONIN I   ____________________________________________  EKG  ED ECG REPORT I, Arelia Longest, the attending physician, personally viewed and interpreted this ECG.   Date: 02/19/2015  EKG Time: 1552  Rate: 60  Rhythm: atrial fibrillation, rate 60  Axis: Normal axis  Intervals:none  ST&T Change: No ST segment elevation or depression. No abnormal T-wave inversion.  No significant change from 02/13/2015.  ____________________________________________  RADIOLOGY  Cardiomegaly without edema. Small bilateral pleural effusions. Status post CABG. ____________________________________________   PROCEDURES   ____________________________________________   INITIAL IMPRESSION / ASSESSMENT AND PLAN / ED COURSE  Pertinent labs & imaging results that were available during my care of the patient were reviewed by me and considered in my medical decision making (see chart for details).  ----------------------------------------- 9:23 PM on 02/19/2015 -----------------------------------------  Patient and family updated about positive C. difficile status. We will prescribe vancomycin because the patient was refractory to Flagyl in the  past. They're also requesting a refill of the probiotic there were given. Patient's legs to be rewrapped with Vaseline gauze and Ace wrap. Family says that the patient is able to follow-up with the pain center on Monday to have his Unna boot rewrapped. We'll discharge to home. We also discussed return precautions including any worsening or concerning symptoms especially increased diarrhea or abdominal pain. ____________________________________________   FINAL CLINICAL IMPRESSION(S) / ED DIAGNOSES  Acute C. difficile. Initial visit.    Myrna Blazer, MD 02/19/15 2125

## 2015-02-19 NOTE — Discharge Instructions (Signed)
FAQs What is Clostridium difficile infection?  Clostridium difficile [pronounced Klo-STRID-ee-um dif-uh-SEEL], also known as "C. diff" [See-dif], is a germ that can cause diarrhea. Most cases of C. diff infection occur in patients taking antibiotics. The most common symptoms of a C. diff infection include:  Watery diarrhea  Fever  Loss of appetite  Nausea  Belly pain and tenderness Who is most likely to get C. diff infection? The elderly and people with certain medical problems have the greatest chance of getting C. diff. C. diff spores can live outside the human body for a very long time and may be found on things in the environment such as bed linens, bed rails, bathroom fixtures, and medical equipment. C. diff infection can spread from person-to-person on contaminated equipment and on the hands of doctors, nurses, other healthcare providers and visitors. Can C. diff infection be treated? Yes, there are antibiotics that can be used to treat C. diff. In some severe cases, a person might have to have surgery to remove the infected part of the intestines. This surgery is needed in only 1 or 2 out of every 100 persons with C. diff. What are some of the things that hospitals are doing to prevent C. diff infections? To prevent C. diff infections, doctors, nurses, and other healthcare providers:  Clean their hands with soap and water or an alcohol-based hand rub before and after caring for every patient. This can prevent C. diff and other germs from being passed from one patient to another on their hands.  Carefully clean hospital rooms and medical equipment that have been used for patients with C. diff.  Use Contact Precautions to prevent C. diff from spreading to other patients. Contact Precautions mean:  Whenever possible, patients with C. diff will have a single room or share a room only with someone else who also has C. diff.  Healthcare providers will put on gloves and wear a gown over  their clothing while taking care of patients with C. diff.  Visitors may also be asked to wear a gown and gloves.  When leaving the room, hospital providers and visitors remove their gown and gloves and clean their hands.  Patients on Contact Precautions are asked to stay in their hospital rooms as much as possible. They should not go to common areas, such as the gift shop or cafeteria. They can go to other areas of the hospital for treatments and tests.  Only give patients antibiotics when it is necessary. What can I do to help prevent C. diff infections?  Make sure that all doctors, nurses, and other healthcare providers clean their hands with soap and water or an alcohol-based hand rub before and after caring for you.  If you do not see your providers clean their hands, please ask them to do so.  Only take antibiotics as prescribed by your doctor.  Be sure to clean your own hands often, especially after using the bathroom and before eating. Can my friends and family get C. diff when they visit me? C. diff infection usually does not occur in persons who are not taking antibiotics. Visitors are not likely to get C. diff. Still, to make it safer for visitors, they should:  Clean their hands before they enter your room and as they leave your room  Ask the nurse if they need to wear protective gowns and gloves when they visit you. What do I need to do when I go home from the hospital? Once you are back  at home, you can return to your normal routine. Often, the diarrhea will be better or completely gone before you go home. This makes giving C. diff to other people much less likely. There are a few things you should do, however, to lower the chances of developing C. diff infection again or of spreading it to others.  If you are given a prescription to treat C. diff, take the medicine exactly as prescribed by your doctor and pharmacist. Do not take half-doses or stop before you run out.  Wash  your hands often, especially after going to the bathroom and before preparing food.  People who live with you should wash their hands often as well.  If you develop more diarrhea after you get home, tell your doctor immediately.  Your doctor may give you additional instructions. If you have questions, please ask your doctor or nurse. Developed and co-sponsored by [employer] for [employer] of [place] [phone](SHEA); Infectious Diseases Society of America (IDSA); [facility]; Association for Professionals in Infection Control and Epidemiology (APIC); Centers for Disease Control and Prevention (CDC); and [employer].   This information is not intended to replace advice given to you by your health care provider. Make sure you discuss any questions you have with your health care provider.   Document Released: 04/29/2013 Document Revised: 09/08/2014 Document Reviewed: 07/08/2014 Elsevier Interactive Patient Education 2016 Elsevier Inc.  Clostridium Difficile Infection Clostridium difficile (C. difficile or C. diff) is a germ found in the intestines. C. difficile infection can happen after taking antibiotic medicines. Antiobiotics may cause the C. difficile to grow out of control and make a toxin that causes the infection. C. difficile infection can cause watery poop (diarrhea) or severe disease. HOME CARE  Drink enough fluids to keep your pee (urine) clear or pale yellow. Avoid milk, caffeine, and alcohol.  Ask your doctor how to replace the body fluids you lost (rehydrate).  Eat small meals often. Avoid eating large meals.  Take your antibiotics as told. Finish them even if you start to feel better.  Do not  take medicines that help watery poop. These medicines may stop you from healing as fast or cause problems.  Wash your hands after using the bathroom and before touching food. Make sure people who live with you wash their hands often.  Clean all surfaces  in your home. Use a product that has chlorine bleach in it. GET HELP IF:  Your watery poop lasts longer than expected.  Your watery poop comes back after you finish your antibiotic medicine.  You feel very dry or thirsty (dehydrated).  You have a fever. GET HELP RIGHT AWAY IF:   You have more stomach pain or tenderness.  You have blood in your poop (stool). Your poop may look black and tar-like.  You cannot eat or drink without throwing up (vomiting).   This information is not intended to replace advice given to you by your health care provider. Make sure you discuss any questions you have with your health care provider.   Document Released: 02/19/2009 Document Revised: 05/15/2014 Document Reviewed: 10/26/2014 Elsevier Interactive Patient Education [employer] Else[employer] Inc.

## 2015-02-19 NOTE — ED Notes (Signed)
Pt ambulatory to bathroom,  Had small amount mucousy stool.   Sample collected and sent to lab.

## 2015-02-20 NOTE — ED Notes (Addendum)
Bilateral legs wrapped with vasoline gauze and ace wraps because nursing supervisor was unable to find unna boots, pt to follow up with vein clinic Monday to have legs re-wrapped

## 2015-03-03 ENCOUNTER — Encounter: Payer: Self-pay | Admitting: *Deleted

## 2015-03-04 ENCOUNTER — Ambulatory Visit
Admission: RE | Admit: 2015-03-04 | Discharge: 2015-03-04 | Disposition: A | Discharge status: Home / Self Care | Payer: Medicare Other | Source: Ambulatory Visit | Attending: Gastroenterology | Admitting: Gastroenterology

## 2015-03-04 ENCOUNTER — Ambulatory Visit: Payer: Medicare Other | Admitting: *Deleted

## 2015-03-04 ENCOUNTER — Encounter: Payer: Self-pay | Admitting: *Deleted

## 2015-03-04 ENCOUNTER — Encounter
Admission: RE | Disposition: A | Discharge status: Home / Self Care | Payer: Self-pay | Source: Ambulatory Visit | Attending: Gastroenterology

## 2015-03-04 DIAGNOSIS — Z87891 Personal history of nicotine dependence: Secondary | ICD-10-CM | POA: Insufficient documentation

## 2015-03-04 DIAGNOSIS — Z7982 Long term (current) use of aspirin: Secondary | ICD-10-CM | POA: Diagnosis not present

## 2015-03-04 DIAGNOSIS — E785 Hyperlipidemia, unspecified: Secondary | ICD-10-CM | POA: Diagnosis not present

## 2015-03-04 DIAGNOSIS — N400 Enlarged prostate without lower urinary tract symptoms: Secondary | ICD-10-CM | POA: Diagnosis not present

## 2015-03-04 DIAGNOSIS — Z955 Presence of coronary angioplasty implant and graft: Secondary | ICD-10-CM | POA: Insufficient documentation

## 2015-03-04 DIAGNOSIS — K259 Gastric ulcer, unspecified as acute or chronic, without hemorrhage or perforation: Secondary | ICD-10-CM | POA: Insufficient documentation

## 2015-03-04 DIAGNOSIS — J449 Chronic obstructive pulmonary disease, unspecified: Secondary | ICD-10-CM | POA: Diagnosis not present

## 2015-03-04 DIAGNOSIS — K2950 Unspecified chronic gastritis without bleeding: Secondary | ICD-10-CM | POA: Insufficient documentation

## 2015-03-04 DIAGNOSIS — Z8673 Personal history of transient ischemic attack (TIA), and cerebral infarction without residual deficits: Secondary | ICD-10-CM | POA: Insufficient documentation

## 2015-03-04 DIAGNOSIS — I252 Old myocardial infarction: Secondary | ICD-10-CM | POA: Diagnosis not present

## 2015-03-04 DIAGNOSIS — Z7901 Long term (current) use of anticoagulants: Secondary | ICD-10-CM | POA: Diagnosis not present

## 2015-03-04 DIAGNOSIS — I4891 Unspecified atrial fibrillation: Secondary | ICD-10-CM | POA: Diagnosis not present

## 2015-03-04 DIAGNOSIS — Z79899 Other long term (current) drug therapy: Secondary | ICD-10-CM | POA: Insufficient documentation

## 2015-03-04 DIAGNOSIS — Z8249 Family history of ischemic heart disease and other diseases of the circulatory system: Secondary | ICD-10-CM | POA: Diagnosis not present

## 2015-03-04 DIAGNOSIS — K7460 Unspecified cirrhosis of liver: Secondary | ICD-10-CM | POA: Diagnosis present

## 2015-03-04 DIAGNOSIS — Z833 Family history of diabetes mellitus: Secondary | ICD-10-CM | POA: Diagnosis not present

## 2015-03-04 DIAGNOSIS — I429 Cardiomyopathy, unspecified: Secondary | ICD-10-CM | POA: Diagnosis not present

## 2015-03-04 DIAGNOSIS — E7800 Pure hypercholesterolemia, unspecified: Secondary | ICD-10-CM | POA: Insufficient documentation

## 2015-03-04 DIAGNOSIS — I509 Heart failure, unspecified: Secondary | ICD-10-CM | POA: Insufficient documentation

## 2015-03-04 DIAGNOSIS — E669 Obesity, unspecified: Secondary | ICD-10-CM | POA: Insufficient documentation

## 2015-03-04 DIAGNOSIS — I2510 Atherosclerotic heart disease of native coronary artery without angina pectoris: Secondary | ICD-10-CM | POA: Diagnosis not present

## 2015-03-04 DIAGNOSIS — Z6833 Body mass index (BMI) 33.0-33.9, adult: Secondary | ICD-10-CM | POA: Diagnosis not present

## 2015-03-04 DIAGNOSIS — E039 Hypothyroidism, unspecified: Secondary | ICD-10-CM | POA: Diagnosis not present

## 2015-03-04 DIAGNOSIS — K210 Gastro-esophageal reflux disease with esophagitis: Secondary | ICD-10-CM | POA: Insufficient documentation

## 2015-03-04 DIAGNOSIS — M109 Gout, unspecified: Secondary | ICD-10-CM | POA: Diagnosis not present

## 2015-03-04 HISTORY — PX: ESOPHAGOGASTRODUODENOSCOPY (EGD) WITH PROPOFOL: SHX5813

## 2015-03-04 HISTORY — DX: Acute myocardial infarction, unspecified: I21.9

## 2015-03-04 HISTORY — DX: Gout, unspecified: M10.9

## 2015-03-04 HISTORY — DX: Unspecified cirrhosis of liver: K74.60

## 2015-03-04 HISTORY — DX: Hypothyroidism, unspecified: E03.9

## 2015-03-04 HISTORY — DX: Localized edema: R60.0

## 2015-03-04 HISTORY — DX: Obesity, unspecified: E66.9

## 2015-03-04 HISTORY — DX: Cerebral infarction, unspecified: I63.9

## 2015-03-04 HISTORY — DX: Atherosclerotic heart disease of native coronary artery without angina pectoris: I25.10

## 2015-03-04 SURGERY — ESOPHAGOGASTRODUODENOSCOPY (EGD) WITH PROPOFOL
Anesthesia: General

## 2015-03-04 MED ORDER — PROPOFOL 500 MG/50ML IV EMUL
INTRAVENOUS | Status: DC | PRN
  Administered 2015-03-04: 100 ug/kg/min via INTRAVENOUS

## 2015-03-04 MED ORDER — SODIUM CHLORIDE 0.9 % IV SOLN
INTRAVENOUS | Status: DC
  Administered 2015-03-04: 11:00:00 via INTRAVENOUS

## 2015-03-04 MED ORDER — LACTATED RINGERS IV SOLN
INTRAVENOUS | Status: DC | PRN
  Administered 2015-03-04: 11:00:00 via INTRAVENOUS

## 2015-03-04 MED ORDER — KETAMINE HCL 10 MG/ML IJ SOLN
INTRAMUSCULAR | Status: DC | PRN
  Administered 2015-03-04: 10 mg via INTRAVENOUS

## 2015-03-04 NOTE — Op Note (Addendum)
[facility] Gastroenterology Patient Name: Blake White Procedure Date: 03/04/2015 11:08 AM MRN: [id number] Account #: [id number] Date of Birth: 05/30/1933 Admit Type: Outpatient Age: [age] [facility] 3 Gender: Male Note Status: Addendum Procedure:         Upper GI endoscopy Indications:       Cirrhosis rule out esophageal varices Patient Profile:   This is a 81 year old male. Providers:         Rhona RaiderMatthew G. Shelle Ironein, MD Referring MD:      Haynes Kernsharanjit S. Virk (Referring MD) Medicines:         Propofol per Anesthesia Complications:     No immediate complications. Procedure:         Pre-Anesthesia Assessment:                    - Prior to the procedure, a History and Physical was                     performed, and patient medications, allergies and                     sensitivities were reviewed. The patient's tolerance of                     previous anesthesia was reviewed.                    After obtaining informed consent, the endoscope was passed                     under direct vision. Throughout the procedure, the                     patient's blood pressure, pulse, and oxygen saturations                     were monitored continuously. The Endoscope was introduced                     through the mouth, and advanced to the second part of                     duodenum. The upper GI endoscopy was accomplished without                     difficulty. The patient tolerated the procedure well. Findings:      LA Grade C (one or more mucosal breaks continuous between tops of 2 or       more mucosal folds, less than 75% circumference) esophagitis with no       bleeding was found at the gastroesophageal junction. Biopsies were taken       with a cold forceps for histology.      A few dispersed, small non-bleeding erosions were found in the gastric       antrum. Biopsies were taken with a cold forceps for histology.      The examined duodenum was normal. Impression:         - LA Grade C reflux esophagitis. Biopsied. No esophageal                     varices.                    - Non-bleeding erosive gastropathy. Biopsied.                    -  Normal examined duodenum. Recommendation:    - Observe patient in GI recovery unit.                    - Low sodium diet.                    - Continue present medications.                    - Consider ranitidine for esophagitis and erosions since                     c.diff developed shortly after starting omeprazole.                    - Start probiotic.                    - Await pathology results.                    - The findings and recommendations were discussed with the                     patient.                    - The findings and recommendations were discussed with the                     patient's family.                    - Return to liver clinic. Procedure Code(s): --- Professional ---                    [phone], Esophagogastroduodenoscopy, flexible, transoral;                     with biopsy, single or multiple CPT copyright 2014 American Medical Association. All rights reserved. The codes documented in this report are preliminary and upon coder review may  be revised to meet current compliance requirements. Kathalene Frames, MD 03/04/2015 11:34:51 AM This report has been signed electronically. Number of Addenda: 1 Note Initiated On: 03/04/2015 11:08 AM      [facility]

## 2015-03-04 NOTE — Anesthesia Preprocedure Evaluation (Signed)
Anesthesia Evaluation  Patient identified by MRN, date of birth, ID band Patient awake    Reviewed: Allergy & Precautions, NPO status , Patient's Chart, lab work & pertinent test results  Airway Mallampati: III       Dental  (+) Upper Dentures   Pulmonary neg pulmonary ROS, former smoker,     + decreased breath sounds      Cardiovascular + CAD, + Past MI and +CHF   Rate:Normal     Neuro/Psych    GI/Hepatic negative GI ROS, Neg liver ROS,   Endo/Other  Hypothyroidism   Renal/GU negative Renal ROS     Musculoskeletal   Abdominal (+) + obese,   Peds  Hematology   Anesthesia Other Findings   Reproductive/Obstetrics negative OB ROS                             Anesthesia Physical Anesthesia Plan  ASA: III  Anesthesia Plan: General   Post-op Pain Management:    Induction: Intravenous  Airway Management Planned: Nasal Cannula  Additional Equipment:   Intra-op Plan:   Post-operative Plan:   Informed Consent: I have reviewed the patients History and Physical, chart, labs and discussed the procedure including the risks, benefits and alternatives for the proposed anesthesia with the patient or authorized representative who has indicated his/her understanding and acceptance.     Plan Discussed with: CRNA  Anesthesia Plan Comments:         Anesthesia Quick Evaluation

## 2015-03-04 NOTE — Transfer of Care (Signed)
Immediate Anesthesia Transfer of Care Note  Patient: Blake White  Procedure(s) Performed: Procedure(s): ESOPHAGOGASTRODUODENOSCOPY (EGD) WITH PROPOFOL ([name])  Patient Location: PACU  Anesthesia Type:General  Level of Consciousness: sedated  Airway & Oxygen Therapy: Patient Spontanous Breathing and Patient connected to nasal cannula oxygen  Post-op Assessment: Report given to RN and Post -op Vital signs reviewed and stable  Post vital signs: Reviewed and stable  Last Vitals:  Filed Vitals:   03/04/15 1013  BP: 104/74  Pulse: 59  Temp: 36.8 C  Resp: 14    Complications: No apparent anesthesia complications

## 2015-03-04 NOTE — Anesthesia Postprocedure Evaluation (Signed)
  Anesthesia Post-op Note  Patient: Blake White  Procedure(s) Performed: Procedure(s): ESOPHAGOGASTRODUODENOSCOPY (EGD) WITH PROPOFOL ([name])  Anesthesia type:General  Patient location: PACU  Post pain: Pain level controlled  Post assessment: Post-op Vital signs reviewed, Patient's Cardiovascular Status Stable, Respiratory Function Stable, Patent Airway and No signs of Nausea or vomiting  Post vital signs: Reviewed and stable  Last Vitals:  Filed Vitals:   03/04/15 1013  BP: 104/74  Pulse: 59  Temp: 36.8 C  Resp: 14    Level of consciousness: awake, alert  and patient cooperative  Complications: No apparent anesthesia complications

## 2015-03-04 NOTE — Discharge Instructions (Signed)
Restart Coumadin tomorrow.   YOU HAD AN ENDOSCOPIC PROCEDURE TODAY: Refer to the procedure report that was given to you for any specific questions about what was found during the examination.  If the procedure report does not answer your questions, please call your gastroenterologist to clarify.  YOU SHOULD EXPECT: Some feelings of bloating in the abdomen. Passage of more gas than usual.  Walking can help get rid of the air that was put into your GI tract during the procedure and reduce the bloating. If you had a lower endoscopy (such as a colonoscopy or flexible sigmoidoscopy) you may notice spotting of blood in your stool or on the toilet paper.   DIET: Your first meal following the procedure should be a light meal and then it is ok to progress to your normal diet.  A half-sandwich or bowl of soup is an example of a good first meal.  Heavy or fried foods are harder to digest and may make you feel nasueas or bloated.  Drink plenty of fluids but you should avoid alcoholic beverages for 24 hours.  ACTIVITY: Your care partner should take you home directly after the procedure.  You should plan to take it easy, moving slowly for the rest of the day.  You can resume normal activity the day after the procedure however you should NOT DRIVE or use heavy machinery for 24 hours (because of the sedation medicines used during the test).    SYMPTOMS TO REPORT IMMEDIATELY  A gastroenterologist can be reached at any hour.  Please call your doctor's office for any of the following symptoms:   Following lower endoscopy (colonoscopy, flexible sigmoidoscopy)  Excessive amounts of blood in the stool  Significant tenderness, worsening of abdominal pains  Swelling of the abdomen that is new, acute  Fever of 100 or higher  Following upper endoscopy (EGD, EUS, ERCP)  Vomiting of blood or coffee ground material  New, significant abdominal pain  New, significant chest pain or pain under the shoulder blades  Painful  or persistently difficult swallowing  New shortness of breath  Black, tarry-looking stools  FOLLOW UP: If any biopsies were taken you will be contacted by phone or by letter within the next 1-3 weeks.  Call your gastroenterologist if you have not heard about the biopsies in 3 weeks.  Please also call your gastroenterologist's office with any specific questions about appointments or follow up tests.

## 2015-03-04 NOTE — H&P (Signed)
Primary Care Physician:  [name], Charanjit, MD  Pre-Procedure History & Physical: HPI:  Blake White is a 81 y.o. male is here for an endoscopy.   Past Medical History  Diagnosis Date  . CHF (congestive heart failure) (HCC)   . BPH (benign prostatic hyperplasia)   . A-fib (HCC)   . Thyroid disease   . High cholesterol   . Bilateral lower extremity edema   . CAD (coronary artery disease)   . Cirrhosis (HCC)   . CVA (cerebral vascular accident) (HCC)   . Gout   . Hypothyroidism   . Myocardial infarction (HCC)   . Obesity     Past Surgical History  Procedure Laterality Date  . Vein bypass surgery    . Vascular surgery    . Cardiac surgery      quad bypass    Prior to Admission medications   Medication Sig Start Date End Date Taking? Authorizing Provider  allopurinol (ZYLOPRIM) 300 MG tablet Take 150 mg by mouth daily. 11/20/14  Yes Historical Provider, MD  amiodarone (PACERONE) 200 MG tablet Take 200 mg by mouth daily. 10/20/14  Yes Historical Provider, MD  aspirin EC 81 MG tablet Take 81 mg by mouth daily. 03/11/14  Yes Historical Provider, MD  Cholecalciferol (VITAMIN D3) 1000 UNITS CAPS Take 1,000 Units by mouth daily. 03/11/14  Yes Historical Provider, MD  furosemide (LASIX) 40 MG tablet Take 1 tablet (40 mg total) by mouth 2 (two) times daily. 01/20/15  Yes Ramonita LabAruna Gouru, MD  levothyroxine (SYNTHROID, LEVOTHROID) 150 MCG tablet Take 150 mcg by mouth daily before breakfast. 11/02/14  Yes Historical Provider, MD  midodrine (PROAMATINE) 5 MG tablet Take 1 tablet (5 mg total) by mouth 3 (three) times daily with meals. 01/20/15  Yes Ramonita LabAruna Gouru, MD  potassium chloride SA (K-DUR,KLOR-CON) 20 MEQ tablet Take 20 mEq by mouth 2 (two) times daily. 01/05/15  Yes Historical Provider, MD  saccharomyces boulardii (FLORASTOR) 250 MG capsule Take 1 capsule (250 mg total) by mouth 2 (two) times daily. 02/19/15  Yes Myrna Blazeravid Arrion Burruel Schaevitz, MD  simvastatin (ZOCOR) 40 MG tablet Take 40 mg by mouth  at bedtime. 11/26/14  Yes Historical Provider, MD  spironolactone (ALDACTONE) 100 MG tablet Take 1 tablet (100 mg total) by mouth daily. 01/20/15  Yes Ramonita LabAruna Gouru, MD  vancomycin (VANCOCIN) 50 mg/mL oral solution Take 2.5 mLs (125 mg total) by mouth every 6 (six) hours. 02/19/15  Yes Myrna Blazeravid Parrie Rasco Schaevitz, MD  vancomycin (VANCOCIN) 125 MG capsule Take 1 capsule (125 mg total) by mouth 4 (four) times daily. Patient not taking: Reported on 03/04/2015 02/19/15   Myrna Blazeravid Dailynn Nancarrow Schaevitz, MD  warfarin (COUMADIN) 3 MG tablet Take 1 tablet (3 mg total) by mouth every evening. 01/12/15   Enedina FinnerSona Patel, MD    Allergies as of 02/09/2015  . (No Known Allergies)    Family History  Problem Relation Age of Onset  . Diabetes Mellitus II Mother   . Lung cancer Brother     Social History   Social History  . Marital Status: Married    Spouse Name: [name]  . Number of Children: [name]  . Years of Education: [name]   Occupational History  . Not on file.   Social History Main Topics  . Smoking status: Former Games developermoker  . Smokeless tobacco: Never Used  . Alcohol Use: No  . Drug Use: No  . Sexual Activity: Not on file   Other Topics Concern  . Not on file   Social  History Narrative     Physical Exam: BP 104/74 mmHg  Pulse 59  Temp(Src) 98.2 F (36.8 C) (Oral)  Resp 14  Wt 102.513 kg (226 lb)  SpO2 100% General:   Alert,  pleasant and cooperative in NAD Head:  Normocephalic and atraumatic. Neck:  Supple; no masses or thyromegaly. Lungs:  Clear throughout to auscultation.    Heart:  Regular rate and rhythm. Abdomen:  Soft, nontender, ,+ distension,  Normal bowel sounds, without guarding, and without rebound.   Neurologic:  Alert and  oriented x4;  grossly normal neurologically.  Impression/Plan: Blake White is here for an endoscopy to be performed for cirrhosis, r/o varices  Risks, benefits, limitations, and alternatives regarding  endoscopy have been reviewed with the patient.  Questions  have been answered.  All parties agreeable.   Elnita Maxwell, MD  03/04/2015, 11:11 AM

## 2015-03-05 LAB — SURGICAL PATHOLOGY

## 2015-03-07 ENCOUNTER — Encounter: Payer: Self-pay | Admitting: Gastroenterology

## 2015-03-13 ENCOUNTER — Emergency Department: Payer: Medicare Other

## 2015-03-13 ENCOUNTER — Inpatient Hospital Stay
Admission: EM | Admit: 2015-03-13 | Discharge: 2015-03-24 | DRG: 371 | Disposition: A | Discharge status: Skilled Nursing Facility | Payer: Medicare Other | Attending: Internal Medicine | Admitting: Internal Medicine

## 2015-03-13 ENCOUNTER — Encounter: Payer: Self-pay | Admitting: Emergency Medicine

## 2015-03-13 DIAGNOSIS — A047 Enterocolitis due to Clostridium difficile: Secondary | ICD-10-CM | POA: Diagnosis present

## 2015-03-13 DIAGNOSIS — N170 Acute kidney failure with tubular necrosis: Secondary | ICD-10-CM | POA: Diagnosis present

## 2015-03-13 DIAGNOSIS — I252 Old myocardial infarction: Secondary | ICD-10-CM | POA: Diagnosis not present

## 2015-03-13 DIAGNOSIS — S51812A Laceration without foreign body of left forearm, initial encounter: Secondary | ICD-10-CM | POA: Diagnosis present

## 2015-03-13 DIAGNOSIS — I482 Chronic atrial fibrillation: Secondary | ICD-10-CM | POA: Diagnosis present

## 2015-03-13 DIAGNOSIS — Z7901 Long term (current) use of anticoagulants: Secondary | ICD-10-CM

## 2015-03-13 DIAGNOSIS — I130 Hypertensive heart and chronic kidney disease with heart failure and stage 1 through stage 4 chronic kidney disease, or unspecified chronic kidney disease: Secondary | ICD-10-CM | POA: Diagnosis present

## 2015-03-13 DIAGNOSIS — E860 Dehydration: Secondary | ICD-10-CM | POA: Diagnosis present

## 2015-03-13 DIAGNOSIS — M79605 Pain in left leg: Secondary | ICD-10-CM

## 2015-03-13 DIAGNOSIS — M25552 Pain in left hip: Secondary | ICD-10-CM

## 2015-03-13 DIAGNOSIS — E7800 Pure hypercholesterolemia, unspecified: Secondary | ICD-10-CM | POA: Diagnosis present

## 2015-03-13 DIAGNOSIS — M109 Gout, unspecified: Secondary | ICD-10-CM | POA: Diagnosis present

## 2015-03-13 DIAGNOSIS — I878 Other specified disorders of veins: Secondary | ICD-10-CM | POA: Diagnosis present

## 2015-03-13 DIAGNOSIS — I5023 Acute on chronic systolic (congestive) heart failure: Secondary | ICD-10-CM | POA: Diagnosis present

## 2015-03-13 DIAGNOSIS — R531 Weakness: Secondary | ICD-10-CM | POA: Diagnosis present

## 2015-03-13 DIAGNOSIS — W1830XA Fall on same level, unspecified, initial encounter: Secondary | ICD-10-CM | POA: Diagnosis present

## 2015-03-13 DIAGNOSIS — G9341 Metabolic encephalopathy: Secondary | ICD-10-CM | POA: Diagnosis present

## 2015-03-13 DIAGNOSIS — N184 Chronic kidney disease, stage 4 (severe): Secondary | ICD-10-CM | POA: Diagnosis present

## 2015-03-13 DIAGNOSIS — I700 Atherosclerosis of aorta: Secondary | ICD-10-CM | POA: Diagnosis present

## 2015-03-13 DIAGNOSIS — E871 Hypo-osmolality and hyponatremia: Secondary | ICD-10-CM | POA: Diagnosis present

## 2015-03-13 DIAGNOSIS — K560 Paralytic ileus: Secondary | ICD-10-CM

## 2015-03-13 DIAGNOSIS — E039 Hypothyroidism, unspecified: Secondary | ICD-10-CM | POA: Diagnosis present

## 2015-03-13 DIAGNOSIS — Z9181 History of falling: Secondary | ICD-10-CM | POA: Diagnosis not present

## 2015-03-13 DIAGNOSIS — T148XXA Other injury of unspecified body region, initial encounter: Secondary | ICD-10-CM

## 2015-03-13 DIAGNOSIS — Z8673 Personal history of transient ischemic attack (TIA), and cerebral infarction without residual deficits: Secondary | ICD-10-CM | POA: Diagnosis not present

## 2015-03-13 DIAGNOSIS — E785 Hyperlipidemia, unspecified: Secondary | ICD-10-CM | POA: Diagnosis present

## 2015-03-13 DIAGNOSIS — R140 Abdominal distension (gaseous): Secondary | ICD-10-CM

## 2015-03-13 DIAGNOSIS — K7460 Unspecified cirrhosis of liver: Secondary | ICD-10-CM | POA: Diagnosis present

## 2015-03-13 DIAGNOSIS — I2510 Atherosclerotic heart disease of native coronary artery without angina pectoris: Secondary | ICD-10-CM | POA: Diagnosis present

## 2015-03-13 DIAGNOSIS — Y92009 Unspecified place in unspecified non-institutional (private) residence as the place of occurrence of the external cause: Secondary | ICD-10-CM

## 2015-03-13 DIAGNOSIS — A0471 Enterocolitis due to Clostridium difficile, recurrent: Secondary | ICD-10-CM | POA: Diagnosis present

## 2015-03-13 DIAGNOSIS — A09 Infectious gastroenteritis and colitis, unspecified: Secondary | ICD-10-CM | POA: Diagnosis present

## 2015-03-13 DIAGNOSIS — N400 Enlarged prostate without lower urinary tract symptoms: Secondary | ICD-10-CM | POA: Diagnosis present

## 2015-03-13 DIAGNOSIS — Z951 Presence of aortocoronary bypass graft: Secondary | ICD-10-CM | POA: Diagnosis not present

## 2015-03-13 DIAGNOSIS — R410 Disorientation, unspecified: Secondary | ICD-10-CM

## 2015-03-13 DIAGNOSIS — Z833 Family history of diabetes mellitus: Secondary | ICD-10-CM | POA: Diagnosis not present

## 2015-03-13 DIAGNOSIS — I959 Hypotension, unspecified: Secondary | ICD-10-CM | POA: Diagnosis present

## 2015-03-13 DIAGNOSIS — I872 Venous insufficiency (chronic) (peripheral): Secondary | ICD-10-CM | POA: Diagnosis present

## 2015-03-13 DIAGNOSIS — S51012A Laceration without foreign body of left elbow, initial encounter: Secondary | ICD-10-CM

## 2015-03-13 DIAGNOSIS — Z7982 Long term (current) use of aspirin: Secondary | ICD-10-CM

## 2015-03-13 DIAGNOSIS — R0602 Shortness of breath: Secondary | ICD-10-CM

## 2015-03-13 DIAGNOSIS — A0472 Enterocolitis due to Clostridium difficile, not specified as recurrent: Secondary | ICD-10-CM | POA: Insufficient documentation

## 2015-03-13 DIAGNOSIS — R197 Diarrhea, unspecified: Secondary | ICD-10-CM | POA: Insufficient documentation

## 2015-03-13 DIAGNOSIS — Z801 Family history of malignant neoplasm of trachea, bronchus and lung: Secondary | ICD-10-CM

## 2015-03-13 DIAGNOSIS — Z87891 Personal history of nicotine dependence: Secondary | ICD-10-CM | POA: Diagnosis not present

## 2015-03-13 DIAGNOSIS — S0990XA Unspecified injury of head, initial encounter: Secondary | ICD-10-CM

## 2015-03-13 LAB — COMPREHENSIVE METABOLIC PANEL
ALT: 46 U/L (ref 17–63)
AST: 72 U/L — AB (ref 15–41)
Albumin: 2.8 g/dL — ABNORMAL LOW (ref 3.5–5.0)
Alkaline Phosphatase: 82 U/L (ref 38–126)
Anion gap: 9 (ref 5–15)
BILIRUBIN TOTAL: 1.5 mg/dL — AB (ref 0.3–1.2)
BUN: 70 mg/dL — AB (ref 6–20)
CO2: 23 mmol/L (ref 22–32)
Calcium: 9.7 mg/dL (ref 8.9–10.3)
Chloride: 103 mmol/L (ref 101–111)
Creatinine, Ser: 3.33 mg/dL — ABNORMAL HIGH (ref 0.61–1.24)
GFR, EST AFRICAN AMERICAN: 19 mL/min — AB (ref >60)
GFR, EST NON AFRICAN AMERICAN: 16 mL/min — AB (ref >60)
Glucose, Bld: 107 mg/dL — ABNORMAL HIGH (ref 65–99)
POTASSIUM: 4.5 mmol/L (ref 3.5–5.1)
Sodium: 135 mmol/L (ref 135–145)
TOTAL PROTEIN: 5.9 g/dL — AB (ref 6.5–8.1)

## 2015-03-13 LAB — TROPONIN I: TROPONIN I: 0.04 ng/mL — AB (ref <0.031)

## 2015-03-13 LAB — LIPASE, BLOOD: LIPASE: 53 U/L — AB (ref 11–51)

## 2015-03-13 LAB — CBC
HCT: 36.1 % — ABNORMAL LOW (ref 40.0–52.0)
Hemoglobin: 12 g/dL — ABNORMAL LOW (ref 13.0–18.0)
MCH: 33.8 pg (ref 26.0–34.0)
MCHC: 33.2 g/dL (ref 32.0–36.0)
MCV: 101.6 fL — AB (ref 80.0–100.0)
Platelets: 137 10*3/uL — ABNORMAL LOW (ref 150–440)
RBC: 3.55 MIL/uL — ABNORMAL LOW (ref 4.40–5.90)
RDW: 15.8 % — AB (ref 11.5–14.5)
WBC: 9.5 10*3/uL (ref 3.8–10.6)

## 2015-03-13 LAB — PROTIME-INR
INR: 1.68
PROTHROMBIN TIME: 20 s — AB (ref 11.4–15.0)

## 2015-03-13 LAB — URINALYSIS COMPLETE WITH MICROSCOPIC (ARMC ONLY)
BILIRUBIN URINE: NEGATIVE
Bacteria, UA: NONE SEEN
Glucose, UA: NEGATIVE mg/dL
Hgb urine dipstick: NEGATIVE
KETONES UR: NEGATIVE mg/dL
LEUKOCYTES UA: NEGATIVE
Nitrite: NEGATIVE
PH: 5 (ref 5.0–8.0)
Protein, ur: NEGATIVE mg/dL
SQUAMOUS EPITHELIAL / LPF: NONE SEEN
Specific Gravity, Urine: 1.009 (ref 1.005–1.030)

## 2015-03-13 LAB — C DIFFICILE QUICK SCREEN W PCR REFLEX
C DIFFICILE (CDIFF) INTERP: POSITIVE
C DIFFICILE (CDIFF) TOXIN: POSITIVE — AB
C Diff antigen: POSITIVE — AB

## 2015-03-13 LAB — BRAIN NATRIURETIC PEPTIDE: B Natriuretic Peptide: 471 pg/mL — ABNORMAL HIGH (ref 0.0–100.0)

## 2015-03-13 MED ORDER — SACCHAROMYCES BOULARDII 250 MG PO CAPS
250.0000 mg | ORAL_CAPSULE | Freq: Two times a day (BID) | ORAL | Status: DC
  Administered 2015-03-14 – 2015-03-24 (×22): 250 mg via ORAL
  Filled 2015-03-13 (×23): qty 1

## 2015-03-13 MED ORDER — VITAMIN D 1000 UNITS PO TABS
1000.0000 [IU] | ORAL_TABLET | Freq: Every day | ORAL | Status: DC
  Administered 2015-03-14 – 2015-03-24 (×11): 1000 [IU] via ORAL
  Filled 2015-03-13 (×14): qty 1

## 2015-03-13 MED ORDER — FAMOTIDINE 20 MG PO TABS
20.0000 mg | ORAL_TABLET | Freq: Every day | ORAL | Status: DC
  Administered 2015-03-13 – 2015-03-24 (×12): 20 mg via ORAL
  Filled 2015-03-13 (×12): qty 1

## 2015-03-13 MED ORDER — METOLAZONE 5 MG PO TABS
5.0000 mg | ORAL_TABLET | Freq: Every day | ORAL | Status: DC
  Administered 2015-03-14: 5 mg via ORAL
  Filled 2015-03-13: qty 1

## 2015-03-13 MED ORDER — MIDODRINE HCL 5 MG PO TABS
5.0000 mg | ORAL_TABLET | Freq: Three times a day (TID) | ORAL | Status: DC
  Administered 2015-03-14 – 2015-03-24 (×28): 5 mg via ORAL
  Filled 2015-03-13 (×28): qty 1

## 2015-03-13 MED ORDER — OXYCODONE HCL 5 MG PO TABS
5.0000 mg | ORAL_TABLET | ORAL | Status: DC | PRN
  Administered 2015-03-13 – 2015-03-20 (×7): 5 mg via ORAL
  Filled 2015-03-13 (×7): qty 1

## 2015-03-13 MED ORDER — ENOXAPARIN SODIUM 30 MG/0.3ML ~~LOC~~ SOLN
30.0000 mg | SUBCUTANEOUS | Status: DC
  Administered 2015-03-14: 30 mg via SUBCUTANEOUS
  Filled 2015-03-13: qty 0.3

## 2015-03-13 MED ORDER — AMIODARONE HCL 200 MG PO TABS
200.0000 mg | ORAL_TABLET | Freq: Every day | ORAL | Status: DC
  Administered 2015-03-14 – 2015-03-24 (×10): 200 mg via ORAL
  Filled 2015-03-13 (×11): qty 1

## 2015-03-13 MED ORDER — SIMVASTATIN 40 MG PO TABS
40.0000 mg | ORAL_TABLET | Freq: Every day | ORAL | Status: DC
  Administered 2015-03-14: 40 mg via ORAL
  Filled 2015-03-13: qty 1

## 2015-03-13 MED ORDER — WARFARIN - PHARMACIST DOSING INPATIENT
Freq: Every day | Status: DC
  Administered 2015-03-16 – 2015-03-22 (×3)
  Filled 2015-03-13 (×4): qty 1

## 2015-03-13 MED ORDER — SPIRONOLACTONE 100 MG PO TABS
100.0000 mg | ORAL_TABLET | Freq: Every day | ORAL | Status: DC
  Administered 2015-03-14: 100 mg via ORAL
  Filled 2015-03-13: qty 1

## 2015-03-13 MED ORDER — LEVOTHYROXINE SODIUM 75 MCG PO TABS
150.0000 ug | ORAL_TABLET | Freq: Every day | ORAL | Status: DC
  Administered 2015-03-14 – 2015-03-24 (×11): 150 ug via ORAL
  Filled 2015-03-13 (×11): qty 2

## 2015-03-13 MED ORDER — ACETAMINOPHEN 325 MG PO TABS
650.0000 mg | ORAL_TABLET | Freq: Four times a day (QID) | ORAL | Status: DC | PRN
  Administered 2015-03-20 – 2015-03-24 (×9): 650 mg via ORAL
  Filled 2015-03-13 (×9): qty 2

## 2015-03-13 MED ORDER — VANCOMYCIN 50 MG/ML ORAL SOLUTION
125.0000 mg | Freq: Four times a day (QID) | ORAL | Status: DC
  Administered 2015-03-14 (×3): 125 mg via ORAL
  Filled 2015-03-13 (×6): qty 2.5

## 2015-03-13 MED ORDER — WARFARIN SODIUM 4 MG PO TABS
4.0000 mg | ORAL_TABLET | Freq: Once | ORAL | Status: AC
  Administered 2015-03-13: 4 mg via ORAL
  Filled 2015-03-13: qty 1

## 2015-03-13 MED ORDER — FUROSEMIDE 40 MG PO TABS
40.0000 mg | ORAL_TABLET | Freq: Every day | ORAL | Status: DC
  Administered 2015-03-14: 40 mg via ORAL
  Filled 2015-03-13: qty 1

## 2015-03-13 MED ORDER — ASPIRIN EC 81 MG PO TBEC
81.0000 mg | DELAYED_RELEASE_TABLET | Freq: Every day | ORAL | Status: DC
  Administered 2015-03-14 – 2015-03-24 (×11): 81 mg via ORAL
  Filled 2015-03-13 (×11): qty 1

## 2015-03-13 MED ORDER — ACETAMINOPHEN 650 MG RE SUPP: 650.0000 mg | Freq: Four times a day (QID) | RECTAL | Status: DC | PRN

## 2015-03-13 NOTE — ED Notes (Signed)
Pt's wife reports pt with weakness since yesterday, reports some diarrhea (hx of cdiff); pt also fell yesterday, pt with generalized edema to bilateral upper and lower extremities. Pt denies any chest pain or shortness of breath, reports lower back pain.

## 2015-03-13 NOTE — H&P (Signed)
[facility] [place] [facility]   PATIENT NAME: Blake White    MR#:  [id number]  DATE OF BIRTH:  1933/07/27  DATE OF ADMISSION:  02/19/2015  PRIMARY CARE PHYSICIAN: Sula Rumple, MD   REQUESTING/REFERRING PHYSICIAN: McShane, MD  CHIEF COMPLAINT:   Generalized weakness, ongoing diarrhea and fall HISTORY OF PRESENT ILLNESS:  Blake White  is a 81 y.o. male with a known history of chronic atrial fibrillation, on Coumadin, congestive heart failure, hypothyroidism, chronic liver cirrhosis and recurrent C. difficile colitis was just admitted to the hospital and treated with by mouth vancomycin for C. difficile colitis. Patient finished his antibiotic course just 1 week ago but still having ongoing diarrhea. For the past 2 days his diarrhea has been worse and copious. Patient was feeling weak and lightheaded and sustained a fall but did not pass out. According to family members patient is slightly confused and not being himself. Patient has a skin tear on the left arm. He was found to be presently confused but answers few questions appropriately and falling asleep. Denies any abdominal pain or fever. Patient's wife and daughters were at bedside  PAST MEDICAL HISTORY:   Past Medical History  Diagnosis Date  . CHF (congestive heart failure) (HCC)   . BPH (benign prostatic hyperplasia)   . A-fib (HCC)   . Thyroid disease   . High cholesterol   . Bilateral lower extremity edema   . CAD (coronary artery disease)   . Cirrhosis (HCC)   . CVA (cerebral vascular accident) (HCC)   . Gout   . Hypothyroidism   . Myocardial infarction (HCC)   . Obesity     PAST SURGICAL HISTOIRY:   Past Surgical History  Procedure Laterality Date  . Vein bypass surgery    . Vascular surgery    . Cardiac surgery      quad bypass  . Esophagogastroduodenoscopy (egd) with propofol [name] 03/04/2015    Procedure: ESOPHAGOGASTRODUODENOSCOPY (EGD) WITH PROPOFOL;  Surgeon: Elnita Maxwell, MD;  Location: [facility] ENDOSCOPY;  Service: Endoscopy;  Laterality: [name];    SOCIAL HISTORY:   Social History  Substance Use Topics  . Smoking status: Former Games developer  . Smokeless tobacco: Never Used  . Alcohol Use: No    FAMILY HISTORY:   Family History  Problem Relation Age of Onset  . Diabetes Mellitus II Mother   . Lung cancer Brother     DRUG ALLERGIES:  No Known Allergies  REVIEW OF SYSTEMS:  Patient is pleasantly confused ,answers few questions  CONSTITUTIONAL: Reporting fatigue and weakness.  EYES: No blurred or double vision.   RESPIRATORY: Has chronic shortness of breath,  CARDIOVASCULAR: No chest pain, orthopnea, edema.  GASTROINTESTINAL: No nausea, vomiting, has chronic ongoing diarrhea, reporting abdominal distention denies abdominal pain.     MEDICATIONS AT HOME:   Prior to Admission medications   Medication Sig Start Date End Date Taking? Authorizing Provider  allopurinol (ZYLOPRIM) 300 MG tablet Take 150 mg by mouth daily. 11/20/14   Historical Provider, MD  amiodarone (PACERONE) 200 MG tablet Take 200 mg by mouth daily. 10/20/14   Historical Provider, MD  aspirin EC 81 MG tablet Take 81 mg by mouth daily. 03/11/14   Historical Provider, MD  Cholecalciferol (VITAMIN D3) 1000 UNITS CAPS Take 1,000 Units by mouth daily. 03/11/14   Historical Provider, MD  furosemide (LASIX) 40 MG tablet Take 1 tablet (40 mg total) by mouth 2 (two) times daily. 01/20/15   Ramonita Lab, MD  furosemide (LASIX) 40 MG tablet Take 40 mg by mouth daily. 02/16/15   Historical Provider, MD  levothyroxine (SYNTHROID, LEVOTHROID) 150 MCG tablet Take 150 mcg by mouth daily before breakfast. 11/02/14   Historical Provider, MD  metolazone (ZAROXOLYN) 5 MG tablet Take 5 mg by mouth daily. 03/11/15   Historical Provider, MD  midodrine (PROAMATINE) 5 MG tablet Take 1 tablet (5 mg total) by mouth 3 (three) times daily with meals. 01/20/15   Ramonita Lab, MD  saccharomyces boulardii (FLORASTOR)  250 MG capsule Take 1 capsule (250 mg total) by mouth 2 (two) times daily. 02/19/15   Myrna Blazer, MD  simvastatin (ZOCOR) 40 MG tablet Take 40 mg by mouth at bedtime. 11/26/14   Historical Provider, MD  spironolactone (ALDACTONE) 100 MG tablet Take 1 tablet (100 mg total) by mouth daily. 01/20/15   Ramonita Lab, MD  vancomycin (VANCOCIN) 125 MG capsule Take 1 capsule (125 mg total) by mouth 4 (four) times daily. Patient not taking: Reported on 03/04/2015 02/19/15   Myrna Blazer, MD  vancomycin (VANCOCIN) 50 mg/mL oral solution Take 2.5 mLs (125 mg total) by mouth every 6 (six) hours. 02/19/15   Myrna Blazer, MD  warfarin (COUMADIN) 3 MG tablet Take 1 tablet (3 mg total) by mouth every evening. 01/12/15   Enedina Finner, MD      VITAL SIGNS:  Blood pressure 93/62, pulse 57, temperature 98 F (36.7 C), temperature source Oral, resp. rate 16, height 5\' 8"  (1.727 m), weight 97.977 kg (216 lb), SpO2 100 %.  PHYSICAL EXAMINATION:  GENERAL:  81 y.o.-year-old patient lying in the bed with no acute distress. Reporting generalized weakness EYES: Pupils equal, round, reactive to light and accommodation. No scleral icterus. Extraocular muscles intact.  HEENT: Head atraumatic, normocephalic. Oropharynx and nasopharynx clear.  NECK:  Supple, no jugular venous distention. No thyroid enlargement, no tenderness.  LUNGS: Normal breath sounds bilaterally, no wheezing, rales,rhonchi or crepitation. No use of accessory muscles of respiration.  CARDIOVASCULAR: S1, S2 normal. No murmurs, rubs, or gallops.  ABDOMEN: Soft, nontender, distended. Bowel sounds present. No organomegaly or mass.  EXTREMITIES: No pedal edema, cyanosis, or clubbing.  NEUROLOGIC: Patient is noticed to be lethargic and arousable to verbal commands, answers few questions and falling asleep  PSYCHIATRIC: The patient is pleasantly confused.  SKIN: No obvious rash, lesion, or ulcer.   LABORATORY PANEL:    CBC  Recent Labs Lab 03/13/15 1658  WBC 9.5  HGB 12.0*  HCT 36.1*  PLT 137*   ------------------------------------------------------------------------------------------------------------------  Chemistries   Recent Labs Lab 03/13/15 1658  NA 135  K 4.5  CL 103  CO2 23  GLUCOSE 107*  BUN 70*  CREATININE 3.33*  CALCIUM 9.7  AST 72*  ALT 46  ALKPHOS 82  BILITOT 1.5*   ------------------------------------------------------------------------------------------------------------------  Cardiac Enzymes  Recent Labs Lab 03/13/15 1658  TROPONINI 0.04*   ------------------------------------------------------------------------------------------------------------------  RADIOLOGY:  Dg Chest 2 View  03/13/2015  CLINICAL DATA:  Weakness since yesterday.  Extremity edema. EXAM: CHEST  2 VIEW COMPARISON:  02/19/2015 FINDINGS: There is mild bilateral interstitial thickening. There is a trace left pleural effusion. There is no focal parenchymal opacity. There is no pneumothorax. There is stable cardiomegaly. There is evidence of prior CABG. There is thoracic aortic atherosclerosis. The osseous structures are unremarkable. IMPRESSION: Findings most concerning for mild CHF. Electronically Signed   By: Elige Ko   On: 03/13/2015 17:49   Ct Head Wo Contrast  03/13/2015  CLINICAL DATA:  81 year old  male with weakness since yesterday. History of fall yesterday. Prior history of stroke. EXAM: CT HEAD WITHOUT CONTRAST CT CERVICAL SPINE WITHOUT CONTRAST TECHNIQUE: Multidetector CT imaging of the head and cervical spine was performed following the standard protocol without intravenous contrast. Multiplanar CT image reconstructions of the cervical spine were also generated. COMPARISON:  Head CT 07/10/2014. FINDINGS: CT HEAD FINDINGS Mild cerebral atrophy. Patchy and confluent areas of decreased attenuation are noted throughout the deep and periventricular white matter of the cerebral  hemispheres bilaterally, compatible with chronic microvascular ischemic disease. Fatty infiltration along the anterior aspect of the falx cerebri again noted, without a well-defined lipoma. No acute displaced skull fractures are identified. No acute intracranial abnormality. Specifically, no evidence of acute post-traumatic intracranial hemorrhage, no definite regions of acute/subacute cerebral ischemia, no focal mass, mass effect, hydrocephalus or abnormal intra or extra-axial fluid collections. The visualized paranasal sinuses and mastoids are well pneumatized. CT CERVICAL SPINE FINDINGS No acute displaced fracture of the cervical spine. Straightening of normal cervical lordosis. Alignment is otherwise anatomic. Prevertebral soft tissues are normal. Multilevel degenerative disc disease, most severe at C6-C7. Multilevel facet arthropathy. Visualized portions of the upper thorax demonstrates a left pleural effusion. IMPRESSION: 1. No evidence of significant acute traumatic injury to the skull, brain or cervical spine. 2. Left pleural effusion incidentally imaged in the upper left hemithorax. Correlation with chest radiograph is recommended. 3. Mild cerebral atrophy with extensive chronic microvascular ischemic changes in the cerebral white matter. 4. Mild multilevel degenerative disc disease and cervical spondylosis, as above. Electronically Signed   By: Trudie Reedaniel  Entrikin M.D.   On: 03/13/2015 17:49   Ct Cervical Spine Wo Contrast  03/13/2015  CLINICAL DATA:  81 year old male with weakness since yesterday. History of fall yesterday. Prior history of stroke. EXAM: CT HEAD WITHOUT CONTRAST CT CERVICAL SPINE WITHOUT CONTRAST TECHNIQUE: Multidetector CT imaging of the head and cervical spine was performed following the standard protocol without intravenous contrast. Multiplanar CT image reconstructions of the cervical spine were also generated. COMPARISON:  Head CT 07/10/2014. FINDINGS: CT HEAD FINDINGS Mild cerebral  atrophy. Patchy and confluent areas of decreased attenuation are noted throughout the deep and periventricular white matter of the cerebral hemispheres bilaterally, compatible with chronic microvascular ischemic disease. Fatty infiltration along the anterior aspect of the falx cerebri again noted, without a well-defined lipoma. No acute displaced skull fractures are identified. No acute intracranial abnormality. Specifically, no evidence of acute post-traumatic intracranial hemorrhage, no definite regions of acute/subacute cerebral ischemia, no focal mass, mass effect, hydrocephalus or abnormal intra or extra-axial fluid collections. The visualized paranasal sinuses and mastoids are well pneumatized. CT CERVICAL SPINE FINDINGS No acute displaced fracture of the cervical spine. Straightening of normal cervical lordosis. Alignment is otherwise anatomic. Prevertebral soft tissues are normal. Multilevel degenerative disc disease, most severe at C6-C7. Multilevel facet arthropathy. Visualized portions of the upper thorax demonstrates a left pleural effusion. IMPRESSION: 1. No evidence of significant acute traumatic injury to the skull, brain or cervical spine. 2. Left pleural effusion incidentally imaged in the upper left hemithorax. Correlation with chest radiograph is recommended. 3. Mild cerebral atrophy with extensive chronic microvascular ischemic changes in the cerebral white matter. 4. Mild multilevel degenerative disc disease and cervical spondylosis, as above. Electronically Signed   By: Trudie Reedaniel  Entrikin M.D.   On: 03/13/2015 17:49    EKG:   Orders placed or performed during the hospital encounter of 02/19/15  . ED EKG  . ED EKG  . EKG  IMPRESSION AND PLAN:   Blake White  is a 81 y.o. male with a known history of chronic atrial fibrillation, on Coumadin, congestive heart failure, hypothyroidism, chronic liver cirrhosis and recurrent C. difficile colitis was just admitted to the hospital and  treated with by mouth vancomycin for C. difficile colitis. Patient finished his antibiotic course just 1 week ago but still having ongoing diarrhea. For the past 2 days his diarrhea has been worse and copious. Patient was feeling weak and lightheaded and sustained a fall but did not pass out. According to family members patient is slightly confused and not being himself   1. Altered mental status secondary to Recurrent C. difficile colitis No improvement with oral vancomycin, completed antibiotic course last week. patient is still having diarrhea and stool is positive for C. difficile  We will consult gastroenterology as they were considering fecal transplantation during last visit and there is no clinical improvement with oral vancomycin Continue by mouth probiotics   2. Acute on chronic kidney injury could be from poor by mouth intake and ongoing diarrhea Will avoid nephrotoxins Nephrology consult is placed Patient has mild CHF and chest x-ray, will continue patient's home medication diuretics and monitor renal function closely   3. Mild CHF Continue home medications spironolactone and Lasix as patient is reporting exertional dyspnea Continue amiodarone Daily weights and monitor intake and output  4. Chronic history of atrial fibrillation rate controlled Continue Coumadin currently not therapeutic Pharmacy consult is placed for Coumadin management Check PT/INR in a.m.   5. Hypothyroidism Continue Synthyroid  6. Generalized weakness with near syncope Probably prerenal from dehydration and ongoing diarrhea We will consult physical therapy Patient is on probiotics  7. Recent history of acute esophagitis Treat with Pepcid      All the records are reviewed and case discussed with ED provider. Management plans discussed with the patient, family and they are in agreement.  CODE STATUS: Full code, wife is the healthcare power of attorney  TOTAL TIME TAKING CARE OF THIS PATIENT:  45 minutes.    Ramonita Lab M.D on 03/13/2015 at 7:31 PM  Between 7am to 6pm - Pager - [phone]  After 6pm go to [email or web address] - password [email or web address] [facility]  [place] [facility]  Office  [phone]  CC: Primary care physician; Sula Rumple, MD

## 2015-03-13 NOTE — Consult Note (Signed)
ANTICOAGULATION CONSULT NOTE - Follow Up Consult  Pharmacy Consult for warfarin Indication: VTE prophylaxis  No Known Allergies  Patient Measurements: Height: 5\' 8"  (172.7 cm) Weight: 226 lb (102.513 kg) IBW/kg (Calculated) : 68.4 Heparin Dosing Weight:   Vital Signs: Temp: 98.7 F (37.1 C) (11/05 1612) Temp Source: Oral (11/05 1612) BP: 121/62 mmHg (11/05 1952) Pulse Rate: 65 (11/05 1952)  Labs:  Recent Labs  03/13/15 1658  HGB 12.0*  HCT 36.1*  PLT 137*  LABPROT 20.0*  INR 1.68  CREATININE 3.33*  TROPONINI 0.04*    Estimated Creatinine Clearance: 20.9 mL/min (by C-G formula based on Cr of 3.33).   Medications:  Scheduled:  . famotidine  20 mg Oral Daily  . warfarin  4 mg Oral ONCE-1800    Assessment: Pt is a 81 year old male who presents to the ED after a fall. Pt has had several days of diarrhea, following cdiff tx a few weeks ago. Pts scans are negative for a bleed due to his fall. INR on arrival is subtherapeutic at 1.68. Pt home dose is 3mg  daily. Pt is on warfarin for Afib.  Goal of Therapy:  INR 2-3 Monitor platelets by anticoagulation protocol: Yes   Plan:  Since INR is subtherapeutic will give 4mg  tonight. Pts current illness/potentially being restarted on antibiotics both have potential to raise INR. Will monitor closely. Will recheck INR in the AM.  Jessyka Austria D Oluwadamilola Rosamond 03/13/2015,7:55 PM

## 2015-03-13 NOTE — ED Provider Notes (Signed)
[facility] Emergency Department Provider Note  ____________________________________________   I have reviewed the triage vital signs and the nursing notes.   HISTORY  Chief Complaint Fall; Weakness; and Diarrhea    HPI Blake White is a 81 y.o. male with multiple medical problems including CHF, recurrent C. difficile, on Coumadin, presents today with a fall. He stood up to answer the telephone after having had nonbloody diarrhea for the last 2 days which is really quite copious. He felt lightheaded and fell. He hit his head. He did not pass out. His bruising to his side and bruising to his head. He denies any focal numbness or weakness. Denies chest pain shortness of breath or abdominal pain. The patient has been on oral vancomycin until last week for his C. difficile, but the diarrhea has restarted. He denies any chest pain or shortness of breath. He does have a baseline renal insufficiency with a creatinine usually in the 2.78 range. His chronic lower extremity swelling. He has a skin tear on his left arm with no sensation of broken bone     Past Medical History  Diagnosis Date  . CHF (congestive heart failure) (HCC)   . BPH (benign prostatic hyperplasia)   . A-fib (HCC)   . Thyroid disease   . High cholesterol   . Bilateral lower extremity edema   . CAD (coronary artery disease)   . Cirrhosis (HCC)   . CVA (cerebral vascular accident) (HCC)   . Gout   . Hypothyroidism   . Myocardial infarction (HCC)   . Obesity     Patient Active Problem List   Diagnosis Date Noted  . Acute kidney injury (HCC) 01/16/2015  . Abdominal pain 01/10/2015  . Diarrhea 01/10/2015    Past Surgical History  Procedure Laterality Date  . Vein bypass surgery    . Vascular surgery    . Cardiac surgery      quad bypass  . Esophagogastroduodenoscopy (egd) with propofol [name] 03/04/2015    Procedure: ESOPHAGOGASTRODUODENOSCOPY (EGD) WITH PROPOFOL;  Surgeon: Elnita Maxwell, MD;  Location: [facility] ENDOSCOPY;  Service: Endoscopy;  Laterality: [name];    Current Outpatient Rx  Name  Route  Sig  Dispense  Refill  . allopurinol (ZYLOPRIM) 300 MG tablet   Oral   Take 150 mg by mouth daily.      11   . amiodarone (PACERONE) 200 MG tablet   Oral   Take 200 mg by mouth daily.      1   . aspirin EC 81 MG tablet   Oral   Take 81 mg by mouth daily.         . Cholecalciferol (VITAMIN D3) 1000 UNITS CAPS   Oral   Take 1,000 Units by mouth daily.         . furosemide (LASIX) 40 MG tablet   Oral   Take 1 tablet (40 mg total) by mouth 2 (two) times daily.   60 tablet   0   . levothyroxine (SYNTHROID, LEVOTHROID) 150 MCG tablet   Oral   Take 150 mcg by mouth daily before breakfast.      1   . midodrine (PROAMATINE) 5 MG tablet   Oral   Take 1 tablet (5 mg total) by mouth 3 (three) times daily with meals.   90 tablet   0   . potassium chloride SA (K-DUR,KLOR-CON) 20 MEQ tablet   Oral   Take 20 mEq by mouth 2 (two) times daily.  6   . saccharomyces boulardii (FLORASTOR) 250 MG capsule   Oral   Take 1 capsule (250 mg total) by mouth 2 (two) times daily.   28 capsule   0   . simvastatin (ZOCOR) 40 MG tablet   Oral   Take 40 mg by mouth at bedtime.      1   . spironolactone (ALDACTONE) 100 MG tablet   Oral   Take 1 tablet (100 mg total) by mouth daily.   30 tablet   0   . vancomycin (VANCOCIN) 125 MG capsule   Oral   Take 1 capsule (125 mg total) by mouth 4 (four) times daily. Patient not taking: Reported on 03/04/2015   56 capsule   0   . vancomycin (VANCOCIN) 50 mg/mL oral solution   Oral   Take 2.5 mLs (125 mg total) by mouth every 6 (six) hours.   140 mL   0   . warfarin (COUMADIN) 3 MG tablet   Oral   Take 1 tablet (3 mg total) by mouth every evening.   30 tablet   1     Allergies Review of patient's allergies indicates no known allergies.  Family History  Problem Relation Age of Onset  . Diabetes  Mellitus II Mother   . Lung cancer Brother     Social History Social History  Substance Use Topics  . Smoking status: Former Games developer  . Smokeless tobacco: Never Used  . Alcohol Use: No    Review of Systems Constitutional: No fever/chills Eyes: No visual changes. ENT: No sore throat. No stiff neck no neck pain Cardiovascular: Denies chest pain. Respiratory: Denies shortness of breath. Gastrointestinal:   no vomiting.  Positive diarrhea.  No constipation. Genitourinary: Negative for dysuria. Musculoskeletal: Negative lower extremity swelling Skin: Negative for rash. Neurological: Negative for headaches, focal weakness or numbness. 10-point ROS otherwise negative.  ____________________________________________   PHYSICAL EXAM:  VITAL SIGNS: ED Triage Vitals  Enc Vitals Group     BP 03/13/15 1612 108/54 mmHg     Pulse Rate 03/13/15 1612 70     Resp 03/13/15 1612 18     Temp 03/13/15 1612 98.7 F (37.1 C)     Temp Source 03/13/15 1612 Oral     SpO2 03/13/15 1612 100 %     Weight 03/13/15 1612 226 lb (102.513 kg)     Height 03/13/15 1612  (1.727 m)     Head Cir --      Peak Flow --      Pain Score 03/13/15 1617 8     Pain Loc --      Pain Edu? --      Excl. in GC? --     Constitutional: Alert and oriented. Chronically ill-appearing male in no acute distress. Eyes: Conjunctivae are normal. PERRL. EOMI. Head: Atraumatic. Nose: No congestion/rhinnorhea. Mouth/Throat: Mucous membranes are moist.  Oropharynx non-erythematous. Neck: No stridor.   Nontender with no meningismus Cardiovascular: Normal rate, regular rhythm. Grossly normal heart sounds.  Good peripheral circulation.  Respiratory: Normal respiratory effort.  No retractions. Diminished in the bases Abdominal: Soft and nontender. No distention. No guarding no rebound Back:  Patient has risen to the left rib cage posteriorly, but no evidence of midline tenderness or fracture or crepitus. there is no CVA  tenderness Musculoskeletal: No lower extremity tenderness. No joint effusions, no DVT signs strong distal pulses positive symmetric bilateral pitting edema Neurologic:  Normal speech and language. No gross focal neurologic deficits are  appreciated.  Skin: Large skin tear noted to the left forearm, not amenable to suture Psychiatric: Mood and affect are normal. Speech and behavior are normal.  ____________________________________________   LABS (all labs ordered are listed, but only abnormal results are displayed)  Labs Reviewed  CBC - Abnormal; Notable for the following:    RBC 3.55 (*)    Hemoglobin 12.0 (*)    HCT 36.1 (*)    MCV 101.6 (*)    RDW 15.8 (*)    Platelets 137 (*)    All other components within normal limits  PROTIME-INR - Abnormal; Notable for the following:    Prothrombin Time 20.0 (*)    All other components within normal limits  TROPONIN I - Abnormal; Notable for the following:    Troponin I 0.04 (*)    All other components within normal limits  COMPREHENSIVE METABOLIC PANEL - Abnormal; Notable for the following:    Glucose, Bld 107 (*)    BUN 70 (*)    Creatinine, Ser 3.33 (*)    Total Protein 5.9 (*)    Albumin 2.8 (*)    AST 72 (*)    Total Bilirubin 1.5 (*)    GFR calc non Af Amer 16 (*)    GFR calc Af Amer 19 (*)    All other components within normal limits  LIPASE, BLOOD - Abnormal; Notable for the following:    Lipase 53 (*)    All other components within normal limits  STOOL CULTURE  C DIFFICILE QUICK SCREEN W PCR REFLEX  URINALYSIS COMPLETEWITH MICROSCOPIC ([facility] ONLY)  BRAIN NATRIURETIC PEPTIDE   ____________________________________________  EKG  I personally interpreted any EKGs ordered by me or triage Likely A. fib rate 68 bpm no acute ST elevation or depression LAD noted low voltage EKG ____________________________________________  RADIOLOGY  I reviewed any imaging ordered by me or triage that were performed during my  shift ____________________________________________   PROCEDURES  Procedure(s) performed: None  Critical Care performed: None  ____________________________________________   INITIAL IMPRESSION / ASSESSMENT AND PLAN / ED COURSE  Pertinent labs & imaging results that were available during my care of the patient were reviewed by me and considered in my medical decision making (see chart for details).  We have cleaned out the patient's arm applied sterile dressing, the concern is the patient has recurrent C. difficile. We have sent a stool culture. The patient has had no rectal bleeding or melena. However, after several bouts of diarrhea his creatinine is again elevated, and she had a positional fall resulting in a head injury. CT is reassuring however. Patient is a bruising to his back but no evidence of rib fracture. Chest x-ray is reassuring although does show some degree of fluid overload, this makes it very difficult to manage the patient. I cannot give him large fluid boluses in the emergency room compensate for what he has lost with the C. difficile which is presumptively they are, as this could cause him to go into CHF. Accordingly, we will admit him to the hospital for further observation and gentle hydration over time. Patient has been on contact precautions since he arrived. ____________________________________________   FINAL CLINICAL IMPRESSION(S) / ED DIAGNOSES  Final diagnoses:  None     Jeanmarie PlantJames A Andersyn Fragoso, MD 03/13/15 1807

## 2015-03-14 DIAGNOSIS — R197 Diarrhea, unspecified: Secondary | ICD-10-CM | POA: Insufficient documentation

## 2015-03-14 DIAGNOSIS — A0472 Enterocolitis due to Clostridium difficile, not specified as recurrent: Secondary | ICD-10-CM | POA: Insufficient documentation

## 2015-03-14 DIAGNOSIS — A047 Enterocolitis due to Clostridium difficile: Principal | ICD-10-CM

## 2015-03-14 DIAGNOSIS — A09 Infectious gastroenteritis and colitis, unspecified: Secondary | ICD-10-CM

## 2015-03-14 LAB — CBC
HEMATOCRIT: 36.6 % — AB (ref 40.0–52.0)
Hemoglobin: 12.2 g/dL — ABNORMAL LOW (ref 13.0–18.0)
MCH: 33.9 pg (ref 26.0–34.0)
MCHC: 33.2 g/dL (ref 32.0–36.0)
MCV: 101.9 fL — AB (ref 80.0–100.0)
PLATELETS: 129 10*3/uL — AB (ref 150–440)
RBC: 3.59 MIL/uL — AB (ref 4.40–5.90)
RDW: 15.9 % — ABNORMAL HIGH (ref 11.5–14.5)
WBC: 13.4 10*3/uL — AB (ref 3.8–10.6)

## 2015-03-14 LAB — PROTIME-INR
INR: 2.03
Prothrombin Time: 23.1 seconds — ABNORMAL HIGH (ref 11.4–15.0)

## 2015-03-14 LAB — URINALYSIS COMPLETE WITH MICROSCOPIC (ARMC ONLY)
BILIRUBIN URINE: NEGATIVE
Bacteria, UA: NONE SEEN
Glucose, UA: NEGATIVE mg/dL
HGB URINE DIPSTICK: NEGATIVE
KETONES UR: NEGATIVE mg/dL
LEUKOCYTES UA: NEGATIVE
NITRITE: NEGATIVE
PH: 5 (ref 5.0–8.0)
PROTEIN: NEGATIVE mg/dL
SPECIFIC GRAVITY, URINE: 1.013 (ref 1.005–1.030)
Squamous Epithelial / LPF: NONE SEEN

## 2015-03-14 LAB — COMPREHENSIVE METABOLIC PANEL
ALBUMIN: 2.7 g/dL — AB (ref 3.5–5.0)
ALK PHOS: 76 U/L (ref 38–126)
ALT: 45 U/L (ref 17–63)
AST: 71 U/L — AB (ref 15–41)
Anion gap: 12 (ref 5–15)
BILIRUBIN TOTAL: 2 mg/dL — AB (ref 0.3–1.2)
BUN: 68 mg/dL — AB (ref 6–20)
CALCIUM: 9.9 mg/dL (ref 8.9–10.3)
CO2: 22 mmol/L (ref 22–32)
CREATININE: 3.29 mg/dL — AB (ref 0.61–1.24)
Chloride: 102 mmol/L (ref 101–111)
GFR calc Af Amer: 19 mL/min — ABNORMAL LOW (ref >60)
GFR calc non Af Amer: 16 mL/min — ABNORMAL LOW (ref >60)
Glucose, Bld: 115 mg/dL — ABNORMAL HIGH (ref 65–99)
Potassium: 4.7 mmol/L (ref 3.5–5.1)
Sodium: 136 mmol/L (ref 135–145)
TOTAL PROTEIN: 5.9 g/dL — AB (ref 6.5–8.1)

## 2015-03-14 MED ORDER — METRONIDAZOLE IN NACL 5-0.79 MG/ML-% IV SOLN
500.0000 mg | Freq: Four times a day (QID) | INTRAVENOUS | Status: DC
  Administered 2015-03-14 – 2015-03-15 (×5): 500 mg via INTRAVENOUS
  Filled 2015-03-14 (×7): qty 100

## 2015-03-14 MED ORDER — VANCOMYCIN 50 MG/ML ORAL SOLUTION
500.0000 mg | Freq: Four times a day (QID) | ORAL | Status: DC
  Administered 2015-03-14 – 2015-03-24 (×39): 500 mg via ORAL
  Filled 2015-03-14 (×44): qty 10

## 2015-03-14 MED ORDER — ATORVASTATIN CALCIUM 20 MG PO TABS
20.0000 mg | ORAL_TABLET | Freq: Every day | ORAL | Status: DC
  Administered 2015-03-14 – 2015-03-23 (×10): 20 mg via ORAL
  Filled 2015-03-14 (×10): qty 1

## 2015-03-14 MED ORDER — WARFARIN SODIUM 1 MG PO TABS
3.0000 mg | ORAL_TABLET | Freq: Every day | ORAL | Status: DC
  Administered 2015-03-14 – 2015-03-17 (×4): 3 mg via ORAL
  Filled 2015-03-14 (×5): qty 3

## 2015-03-14 NOTE — Consult Note (Signed)
ANTICOAGULATION CONSULT NOTE - Follow Up Consult  Pharmacy Consult for warfarin Indication: VTE prophylaxis  No Known Allergies  Patient Measurements: Height: 5\' 8"  (172.7 cm) Weight: 226 lb (102.513 kg) IBW/kg (Calculated) : 68.4 Heparin Dosing Weight:   Vital Signs: Temp: 97.8 F (36.6 C) (11/06 0400) Temp Source: Oral (11/06 0400) BP: 111/53 mmHg (11/06 0400) Pulse Rate: 71 (11/06 0400)  Labs:  Recent Labs  03/13/15 1658 03/14/15 0339  HGB 12.0* 12.2*  HCT 36.1* 36.6*  PLT 137* 129*  LABPROT 20.0* 23.1*  INR 1.68 2.03  CREATININE 3.33* 3.29*  TROPONINI 0.04*  --     Estimated Creatinine Clearance: 21.1 mL/min (by C-G formula based on Cr of 3.29).   Medications:  Scheduled:  . amiodarone  200 mg Oral Daily  . aspirin EC  81 mg Oral Daily  . atorvastatin  20 mg Oral QHS  . cholecalciferol  1,000 Units Oral Daily  . enoxaparin (LOVENOX) injection  30 mg Subcutaneous Q24H  . famotidine  20 mg Oral Daily  . furosemide  40 mg Oral Daily  . levothyroxine  150 mcg Oral QAC breakfast  . metolazone  5 mg Oral Daily  . midodrine  5 mg Oral TID WC  . saccharomyces boulardii  250 mg Oral BID  . spironolactone  100 mg Oral Daily  . vancomycin  125 mg Oral 4 times per day  . Warfarin - Pharmacist Dosing Inpatient   Does not apply q1800    Assessment: Pt is a 81 year old male who presents to the ED after a fall. Pt has had several days of diarrhea, following cdiff tx a few weeks ago. Pts scans are negative for a bleed due to his fall. INR on arrival is subtherapeutic at 1.68. Pt home dose is 3mg  daily. Pt is on warfarin for Afib.  Goal of Therapy:  INR 2-3 Monitor platelets by anticoagulation protocol: Yes   Plan:  Since INR is subtherapeutic will give 4mg  tonight. Pts current illness/potentially being restarted on antibiotics both have potential to raise INR. Will monitor closely. Will recheck INR in the AM.  11/6 INR 2.03. Restart home dose of 3 mg daily. INR in  AM.  Telesa Jeancharles S 03/14/2015,4:47 AM

## 2015-03-14 NOTE — Progress Notes (Signed)
[facility] [place] [facility]   PATIENT NAME: Sherryl MangesBobby Welles    MR#:  [id number]  DATE OF BIRTH:  03/27/1934  SUBJECTIVE:  CHIEF COMPLAINT:   Chief Complaint  Patient presents with  . Fall  . Weakness  . Diarrhea   Tired, weak, continues to have soft stool  REVIEW OF SYSTEMS:   Review of Systems  Constitutional: Positive for malaise/fatigue. Negative for fever.  Respiratory: Negative for shortness of breath.   Cardiovascular: Negative for chest pain and palpitations.  Gastrointestinal: Positive for abdominal pain and diarrhea. Negative for nausea, vomiting and blood in stool.  Genitourinary: Negative for dysuria.  Neurological: Positive for weakness.    DRUG ALLERGIES:  No Known Allergies  VITALS:  Blood pressure 111/53, pulse 71, temperature 97.8 F (36.6 C), temperature source Oral, resp. rate 18, height 5\' 8"  (1.727 m), weight 102.513 kg (226 lb), SpO2 100 %.  PHYSICAL EXAMINATION:  GENERAL:  81 y.o.-year-old patient lying in the bed, uncomfortable LUNGS: Normal breath sounds bilaterally, no wheezing, rales,rhonchi or crepitation. No use of accessory muscles of respiration.  CARDIOVASCULAR: S1, S2 normal. No murmurs, rubs, or gallops.  ABDOMEN: Soft, nontender, distended, tympanic. Bowel sounds present. No organomegaly or mass.  EXTREMITIES: No pedal edema, cyanosis, or clubbing.  NEUROLOGIC: Cranial nerves II through XII are intact. Muscle strength 5/5 in all extremities. Sensation intact. Gait not checked.  PSYCHIATRIC: The patient is alert and oriented x 3.  SKIN: No obvious rash, lesion, or ulcer.    LABORATORY PANEL:   CBC  Recent Labs Lab 03/14/15 0339  WBC 13.4*  HGB 12.2*  HCT 36.6*  PLT 129*   ------------------------------------------------------------------------------------------------------------------  Chemistries   Recent Labs Lab 03/14/15 0339  NA 136  K 4.7  CL 102  CO2 22  GLUCOSE 115*  BUN 68*   CREATININE 3.29*  CALCIUM 9.9  AST 71*  ALT 45  ALKPHOS 76  BILITOT 2.0*   ------------------------------------------------------------------------------------------------------------------  Cardiac Enzymes  Recent Labs Lab 03/13/15 1658  TROPONINI 0.04*   ------------------------------------------------------------------------------------------------------------------  RADIOLOGY:  Dg Chest 2 View  03/13/2015  CLINICAL DATA:  Weakness since yesterday.  Extremity edema. EXAM: CHEST  2 VIEW COMPARISON:  02/19/2015 FINDINGS: There is mild bilateral interstitial thickening. There is a trace left pleural effusion. There is no focal parenchymal opacity. There is no pneumothorax. There is stable cardiomegaly. There is evidence of prior CABG. There is thoracic aortic atherosclerosis. The osseous structures are unremarkable. IMPRESSION: Findings most concerning for mild CHF. Electronically Signed   By: Elige KoHetal  Patel   On: 03/13/2015 17:49   Ct Head Wo Contrast  03/13/2015  CLINICAL DATA:  81 year old male with weakness since yesterday. History of fall yesterday. Prior history of stroke. EXAM: CT HEAD WITHOUT CONTRAST CT CERVICAL SPINE WITHOUT CONTRAST TECHNIQUE: Multidetector CT imaging of the head and cervical spine was performed following the standard protocol without intravenous contrast. Multiplanar CT image reconstructions of the cervical spine were also generated. COMPARISON:  Head CT 07/10/2014. FINDINGS: CT HEAD FINDINGS Mild cerebral atrophy. Patchy and confluent areas of decreased attenuation are noted throughout the deep and periventricular white matter of the cerebral hemispheres bilaterally, compatible with chronic microvascular ischemic disease. Fatty infiltration along the anterior aspect of the falx cerebri again noted, without a well-defined lipoma. No acute displaced skull fractures are identified. No acute intracranial abnormality. Specifically, no evidence of acute post-traumatic  intracranial hemorrhage, no definite regions of acute/subacute cerebral ischemia, no focal mass, mass effect, hydrocephalus or abnormal intra or  extra-axial fluid collections. The visualized paranasal sinuses and mastoids are well pneumatized. CT CERVICAL SPINE FINDINGS No acute displaced fracture of the cervical spine. Straightening of normal cervical lordosis. Alignment is otherwise anatomic. Prevertebral soft tissues are normal. Multilevel degenerative disc disease, most severe at C6-C7. Multilevel facet arthropathy. Visualized portions of the upper thorax demonstrates a left pleural effusion. IMPRESSION: 1. No evidence of significant acute traumatic injury to the skull, brain or cervical spine. 2. Left pleural effusion incidentally imaged in the upper left hemithorax. Correlation with chest radiograph is recommended. 3. Mild cerebral atrophy with extensive chronic microvascular ischemic changes in the cerebral white matter. 4. Mild multilevel degenerative disc disease and cervical spondylosis, as above. Electronically Signed   By: Trudie Reed M.D.   On: 03/13/2015 17:49   Ct Cervical Spine Wo Contrast  03/13/2015  CLINICAL DATA:  81 year old male with weakness since yesterday. History of fall yesterday. Prior history of stroke. EXAM: CT HEAD WITHOUT CONTRAST CT CERVICAL SPINE WITHOUT CONTRAST TECHNIQUE: Multidetector CT imaging of the head and cervical spine was performed following the standard protocol without intravenous contrast. Multiplanar CT image reconstructions of the cervical spine were also generated. COMPARISON:  Head CT 07/10/2014. FINDINGS: CT HEAD FINDINGS Mild cerebral atrophy. Patchy and confluent areas of decreased attenuation are noted throughout the deep and periventricular white matter of the cerebral hemispheres bilaterally, compatible with chronic microvascular ischemic disease. Fatty infiltration along the anterior aspect of the falx cerebri again noted, without a well-defined  lipoma. No acute displaced skull fractures are identified. No acute intracranial abnormality. Specifically, no evidence of acute post-traumatic intracranial hemorrhage, no definite regions of acute/subacute cerebral ischemia, no focal mass, mass effect, hydrocephalus or abnormal intra or extra-axial fluid collections. The visualized paranasal sinuses and mastoids are well pneumatized. CT CERVICAL SPINE FINDINGS No acute displaced fracture of the cervical spine. Straightening of normal cervical lordosis. Alignment is otherwise anatomic. Prevertebral soft tissues are normal. Multilevel degenerative disc disease, most severe at C6-C7. Multilevel facet arthropathy. Visualized portions of the upper thorax demonstrates a left pleural effusion. IMPRESSION: 1. No evidence of significant acute traumatic injury to the skull, brain or cervical spine. 2. Left pleural effusion incidentally imaged in the upper left hemithorax. Correlation with chest radiograph is recommended. 3. Mild cerebral atrophy with extensive chronic microvascular ischemic changes in the cerebral white matter. 4. Mild multilevel degenerative disc disease and cervical spondylosis, as above. Electronically Signed   By: Trudie Reed M.D.   On: 03/13/2015 17:49    EKG:   Orders placed or performed during the hospital encounter of 03/13/15  . ED EKG  . ED EKG  . EKG 12-Lead  . EKG 12-Lead    ASSESSMENT AND PLAN:   Long Brimage is a 81 y.o. male with a known history of chronic atrial fibrillation, on Coumadin, congestive heart failure, hypothyroidism, chronic liver cirrhosis and recurrent C. difficile colitis was just admitted to the hospital and treated with by mouth vancomycin for C. difficile colitis. Patient finished his antibiotic course just 1 week ago but still having ongoing diarrhea. For the past 2 days his diarrhea has been worse and copious. Patient was feeling weak and lightheaded and sustained a fall but did not pass out.  According to family members patient is slightly confused and not being himself  1) recurrent Clostridium difficile colitis - Appreciate GI consultation and consideration for fecal transplant - I will increase dose of oral vancomycin today, continue probiotics  #2 acute on chronic kidney disease stage III -  Due to ATN volume depletion - Continue to avoid nephrotoxins  #3 congestive heart failure with mild exacerbation - Continue spironolactone and Lasix as well as amiodarone - Continue daily weights I's and O's  #4 atrial fibrillation, chronic - Rate controlled - Continue Coumadin per pharmacy  5. Hypothyroidism - Continue Synthyroid  6. Generalized weakness with near syncope - PT eval pending  7. Recent history of acute esophagitis - PPI   All the records are reviewed and case discussed with Care Management/Social Workerr. Management plans discussed with the patient, family and they are in agreement.  CODE STATUS: full  TOTAL TIME TAKING CARE OF THIS PATIENT: 20 minutes.  Greater than 50% of time spent in care coordination and counseling. POSSIBLE D/C IN 2-3 DAYS, DEPENDING ON CLINICAL CONDITION.   Elby Showers M.D on 03/14/2015 at 1:33 PM  Between 7am to 6pm - Pager - [phone]  After 6pm go to [email or web address] - password [email or web address] [facility]  [facility]  Office  [phone]  CC: Primary care physician; Sula Rumple, MD

## 2015-03-14 NOTE — Progress Notes (Signed)
Subjective:  Patient admitted after a fall at home He is struggling with recurrent C Diff He is somnolent History is provided by his wife and daughter who are at bedside   Objective:  Vital signs in last 24 hours:  Temp:  [97.8 F (36.6 C)-98.7 F (37.1 C)] 97.8 F (36.6 C) (11/06 0400) Pulse Rate:  [63-71] 71 (11/06 0400) Resp:  [18-20] 18 (11/05 1952) BP: (90-123)/(48-62) 111/53 mmHg (11/06 0400) SpO2:  [97 %-100 %] 100 % (11/06 0400) Weight:  [102.513 kg (226 lb)] 102.513 kg (226 lb) (11/05 1612)  Weight change:  Filed Weights   03/13/15 1612  Weight: 102.513 kg (226 lb)    Intake/Output:    Intake/Output Summary (Last 24 hours) at 03/14/15 1144 Last data filed at 03/14/15 0421  Gross per 24 hour  Intake      0 ml  Output      0 ml  Net      0 ml     Physical Exam: General: NAD, sleeping deeply  HEENT anicteric  Neck supple  Pulm/lungs Normal effort, clear ant and laterally  CVS/Heart No rub or gallop  Abdomen:  Soft, distended  Extremities: ++ dependent edema  Neurologic: somnolent  Skin: No acute rashes  Access:        Basic Metabolic Panel:   Recent Labs Lab 03/13/15 1658 03/14/15 0339  NA 135 136  K 4.5 4.7  CL 103 102  CO2 23 22  GLUCOSE 107* 115*  BUN 70* 68*  CREATININE 3.33* 3.29*  CALCIUM 9.7 9.9     CBC:  Recent Labs Lab 03/13/15 1658 03/14/15 0339  WBC 9.5 13.4*  HGB 12.0* 12.2*  HCT 36.1* 36.6*  MCV 101.6* 101.9*  PLT 137* 129*      Microbiology:  Recent Results (from the past 720 hour(s))  C difficile quick scan w PCR reflex     Status: Abnormal   Collection Time: 02/19/15  6:49 PM  Result Value Ref Range Status   C Diff antigen POSITIVE (A) NEGATIVE Final   C Diff toxin POSITIVE (A) NEGATIVE Final   C Diff interpretation   Final    Positive for toxigenic C. difficile, active toxin production present.    Comment: CRITICAL RESULT CALLED TO, READ BACK BY AND VERIFIED WITH: [facility] PaKERRY ERDESKY AT 2054  02/19/15.PMH   C difficile quick scan w PCR reflex     Status: Abnormal   Collection Time: 03/13/15  4:58 PM  Result Value Ref Range Status   C Diff antigen POSITIVE (A) NEGATIVE Final   C Diff toxin POSITIVE (A) NEGATIVE Final   C Diff interpretation   Final    Positive for toxigenic C. difficile, active toxin production present.    Comment: CRITICAL RESULT CALLED TO, READ BACK BY AND VERIFIED WITH: [place] LLCKENDALL MOFFITT RN AT 1830 03/13/2015     Coagulation Studies:  Recent Labs  03/13/15 1658 03/14/15 0339  LABPROT 20.0* 23.1*  INR 1.68 2.03    Urinalysis:  Recent Labs  03/13/15 1658  COLORURINE YELLOW*  LABSPEC 1.009  PHURINE 5.0  GLUCOSEU NEGATIVE  HGBUR NEGATIVE  BILIRUBINUR NEGATIVE  KETONESUR NEGATIVE  PROTEINUR NEGATIVE  NITRITE NEGATIVE  LEUKOCYTESUR NEGATIVE      Imaging: Dg Chest 2 View  03/13/2015  CLINICAL DATA:  Weakness since yesterday.  Extremity edema. EXAM: CHEST  2 VIEW COMPARISON:  02/19/2015 FINDINGS: There is mild bilateral interstitial thickening. There is a trace left pleural effusion. There is no focal parenchymal opacity. There  is no pneumothorax. There is stable cardiomegaly. There is evidence of prior CABG. There is thoracic aortic atherosclerosis. The osseous structures are unremarkable. IMPRESSION: Findings most concerning for mild CHF. Electronically Signed   By: Elige Ko   On: 03/13/2015 17:49   Ct Head Wo Contrast  03/13/2015  CLINICAL DATA:  81 year old male with weakness since yesterday. History of fall yesterday. Prior history of stroke. EXAM: CT HEAD WITHOUT CONTRAST CT CERVICAL SPINE WITHOUT CONTRAST TECHNIQUE: Multidetector CT imaging of the head and cervical spine was performed following the standard protocol without intravenous contrast. Multiplanar CT image reconstructions of the cervical spine were also generated. COMPARISON:  Head CT 07/10/2014. FINDINGS: CT HEAD FINDINGS Mild cerebral atrophy. Patchy and confluent areas of  decreased attenuation are noted throughout the deep and periventricular white matter of the cerebral hemispheres bilaterally, compatible with chronic microvascular ischemic disease. Fatty infiltration along the anterior aspect of the falx cerebri again noted, without a well-defined lipoma. No acute displaced skull fractures are identified. No acute intracranial abnormality. Specifically, no evidence of acute post-traumatic intracranial hemorrhage, no definite regions of acute/subacute cerebral ischemia, no focal mass, mass effect, hydrocephalus or abnormal intra or extra-axial fluid collections. The visualized paranasal sinuses and mastoids are well pneumatized. CT CERVICAL SPINE FINDINGS No acute displaced fracture of the cervical spine. Straightening of normal cervical lordosis. Alignment is otherwise anatomic. Prevertebral soft tissues are normal. Multilevel degenerative disc disease, most severe at C6-C7. Multilevel facet arthropathy. Visualized portions of the upper thorax demonstrates a left pleural effusion. IMPRESSION: 1. No evidence of significant acute traumatic injury to the skull, brain or cervical spine. 2. Left pleural effusion incidentally imaged in the upper left hemithorax. Correlation with chest radiograph is recommended. 3. Mild cerebral atrophy with extensive chronic microvascular ischemic changes in the cerebral white matter. 4. Mild multilevel degenerative disc disease and cervical spondylosis, as above. Electronically Signed   By: Trudie Reed M.D.   On: 03/13/2015 17:49   Ct Cervical Spine Wo Contrast  03/13/2015  CLINICAL DATA:  81 year old male with weakness since yesterday. History of fall yesterday. Prior history of stroke. EXAM: CT HEAD WITHOUT CONTRAST CT CERVICAL SPINE WITHOUT CONTRAST TECHNIQUE: Multidetector CT imaging of the head and cervical spine was performed following the standard protocol without intravenous contrast. Multiplanar CT image reconstructions of the cervical  spine were also generated. COMPARISON:  Head CT 07/10/2014. FINDINGS: CT HEAD FINDINGS Mild cerebral atrophy. Patchy and confluent areas of decreased attenuation are noted throughout the deep and periventricular white matter of the cerebral hemispheres bilaterally, compatible with chronic microvascular ischemic disease. Fatty infiltration along the anterior aspect of the falx cerebri again noted, without a well-defined lipoma. No acute displaced skull fractures are identified. No acute intracranial abnormality. Specifically, no evidence of acute post-traumatic intracranial hemorrhage, no definite regions of acute/subacute cerebral ischemia, no focal mass, mass effect, hydrocephalus or abnormal intra or extra-axial fluid collections. The visualized paranasal sinuses and mastoids are well pneumatized. CT CERVICAL SPINE FINDINGS No acute displaced fracture of the cervical spine. Straightening of normal cervical lordosis. Alignment is otherwise anatomic. Prevertebral soft tissues are normal. Multilevel degenerative disc disease, most severe at C6-C7. Multilevel facet arthropathy. Visualized portions of the upper thorax demonstrates a left pleural effusion. IMPRESSION: 1. No evidence of significant acute traumatic injury to the skull, brain or cervical spine. 2. Left pleural effusion incidentally imaged in the upper left hemithorax. Correlation with chest radiograph is recommended. 3. Mild cerebral atrophy with extensive chronic microvascular ischemic changes in the cerebral white  matter. 4. Mild multilevel degenerative disc disease and cervical spondylosis, as above. Electronically Signed   By: Trudie Reed M.D.   On: 03/13/2015 17:49     Medications:     . amiodarone  200 mg Oral Daily  . aspirin EC  81 mg Oral Daily  . atorvastatin  20 mg Oral QHS  . cholecalciferol  1,000 Units Oral Daily  . famotidine  20 mg Oral Daily  . furosemide  40 mg Oral Daily  . levothyroxine  150 mcg Oral QAC breakfast  .  metolazone  5 mg Oral Daily  . midodrine  5 mg Oral TID WC  . saccharomyces boulardii  250 mg Oral BID  . spironolactone  100 mg Oral Daily  . vancomycin  125 mg Oral 4 times per day  . warfarin  3 mg Oral q1800  . Warfarin - Pharmacist Dosing Inpatient   Does not apply q1800   acetaminophen **OR** acetaminophen, oxyCODONE  Assessment/ Plan:  81 y.o.caucasion male white male with congestive heart failure, atrial fibrillation, chronic anticoagulation, hypothyroidism, BPH, hyperlipidemia, hypertension, coronary artery disease status post CABG,Liver cirrhosis who was admitted to [facility] for C. Diff colitis.   1. Acute kidney failure on chronic kidney disease stage IV. Baseline 2.78/GFR 20 2. Edema: generalized.  3. Ascites, Cirrhosis of liver   PLAN: Hold lasix and spironolactone since he has active infection and is probably intravascular volume deplete May resume once his hemodynamics are normal and PO intake improves     LOS: 1 Sanav Remer 11/6/201611:44 AM

## 2015-03-14 NOTE — H&P (Signed)
[facility] - [place] at [facility]   PATIENT NAME: Blake White    MR#:  [id number]  DATE OF BIRTH:  [birth year]  DATE OF ADMISSION:  03/13/2015  PRIMARY CARE PHYSICIAN: Sula Rumple, MD   REQUESTING/REFERRING PHYSICIAN:   CHIEF COMPLAINT:  Generalized weakness, ongoing diarrhea and fall  HISTORY OF PRESENT ILLNESS:  Blake White  is a 81 y.o. male with a known history of chronic atrial fibrillation, on Coumadin, congestive heart failure, hypothyroidism, chronic liver cirrhosis and recurrent C. difficile colitis was just admitted to the hospital and treated with by mouth vancomycin for C. difficile colitis. Patient finished his antibiotic course just 1 week ago but still having ongoing diarrhea. For the past 2 days his diarrhea has been worse and copious. Patient was feeling weak and lightheaded and sustained a fall but did not pass out. According to family members patient is slightly confused and not being himself. Patient has a skin tear on the left arm. He was found to be presently confused but answers few questions appropriately and falling asleep. Denies any abdominal pain or fever. Patient's wife and daughters were at bedside  PAST MEDICAL HISTORY:   Past Medical History  Diagnosis Date  . CHF (congestive heart failure) (HCC)   . BPH (benign prostatic hyperplasia)   . A-fib (HCC)   . Thyroid disease   . High cholesterol   . Bilateral lower extremity edema   . CAD (coronary artery disease)   . Cirrhosis (HCC)   . CVA (cerebral vascular accident) (HCC)   . Gout   . Hypothyroidism   . Myocardial infarction (HCC)   . Obesity     PAST SURGICAL HISTOIRY:   Past Surgical History  Procedure Laterality Date  . Vein bypass surgery    . Vascular surgery    . Cardiac surgery      quad bypass  . Esophagogastroduodenoscopy (egd) with propofol [name] 03/04/2015    Procedure: ESOPHAGOGASTRODUODENOSCOPY (EGD) WITH PROPOFOL;  Surgeon: Elnita Maxwell,  MD;  Location: [facility] ENDOSCOPY;  Service: Endoscopy;  Laterality: [name];    SOCIAL HISTORY:   Social History  Substance Use Topics  . Smoking status: Former Games developer  . Smokeless tobacco: Never Used  . Alcohol Use: No    FAMILY HISTORY:   Family History  Problem Relation Age of Onset  . Diabetes Mellitus II Mother   . Lung cancer Brother     DRUG ALLERGIES:  No Known Allergies  REVIEW OF SYSTEMS:  Review of systems is limited as Patient is pleasantly confused ,answers few questions  CONSTITUTIONAL: Reporting fatigue and weakness.  EYES: No blurred or double vision.  RESPIRATORY: Has chronic shortness of breath,  CARDIOVASCULAR: No chest pain, orthopnea, edema.  GASTROINTESTINAL: No nausea, vomiting, has chronic ongoing diarrhea, reporting abdominal distention denies abdominal pain. MEDICATIONS AT HOME:   Prior to Admission medications   Medication Sig Start Date End Date Taking? Authorizing Provider  allopurinol (ZYLOPRIM) 300 MG tablet Take 150 mg by mouth daily. 11/20/14  Yes Historical Provider, MD  amiodarone (PACERONE) 200 MG tablet Take 200 mg by mouth daily. 10/20/14  Yes Historical Provider, MD  aspirin EC 81 MG tablet Take 81 mg by mouth daily. 03/11/14  Yes Historical Provider, MD  Cholecalciferol (VITAMIN D3) 1000 UNITS CAPS Take 1,000 Units by mouth daily. 03/11/14  Yes Historical Provider, MD  furosemide (LASIX) 40 MG tablet Take 40 mg by mouth daily. 02/16/15  Yes Historical Provider, MD  levothyroxine (SYNTHROID, LEVOTHROID) 150 MCG  tablet Take 150 mcg by mouth daily before breakfast. 11/02/14  Yes Historical Provider, MD  metolazone (ZAROXOLYN) 5 MG tablet Take 5 mg by mouth daily. 03/11/15  Yes Historical Provider, MD  midodrine (PROAMATINE) 5 MG tablet Take 1 tablet (5 mg total) by mouth 3 (three) times daily with meals. 01/20/15  Yes Ramonita Lab, MD  simvastatin (ZOCOR) 40 MG tablet Take 40 mg by mouth at bedtime. 11/26/14  Yes Historical Provider, MD   spironolactone (ALDACTONE) 100 MG tablet Take 1 tablet (100 mg total) by mouth daily. 01/20/15  Yes Ramonita Lab, MD  warfarin (COUMADIN) 3 MG tablet Take 1 tablet (3 mg total) by mouth every evening. 01/12/15  Yes Enedina Finner, MD  furosemide (LASIX) 40 MG tablet Take 1 tablet (40 mg total) by mouth 2 (two) times daily. 01/20/15   Ramonita Lab, MD  saccharomyces boulardii (FLORASTOR) 250 MG capsule Take 1 capsule (250 mg total) by mouth 2 (two) times daily. 02/19/15   Myrna Blazer, MD  vancomycin (VANCOCIN) 125 MG capsule Take 1 capsule (125 mg total) by mouth 4 (four) times daily. Patient not taking: Reported on 03/04/2015 02/19/15   Myrna Blazer, MD  vancomycin (VANCOCIN) 50 mg/mL oral solution Take 2.5 mLs (125 mg total) by mouth every 6 (six) hours. 02/19/15   Myrna Blazer, MD      VITAL SIGNS:   Blood pressure 93/62, pulse 57, temperature 98 F (36.7 C), temperature source Oral, resp. rate 16, height  (1.727 m), weight 97.977 kg (216 lb), SpO2 100 %  PHYSICAL EXAMINATION:  GENERAL: 81 y.o.-year-old patient lying in the bed with no acute distress. Reporting generalized weakness EYES: Pupils equal, round, reactive to light and accommodation. No scleral icterus. Extraocular muscles intact.  HEENT: Head atraumatic, normocephalic. Oropharynx and nasopharynx clear.  NECK: Supple, no jugular venous distention. No thyroid enlargement, no tenderness.  LUNGS: Normal breath sounds bilaterally, no wheezing, rales,rhonchi or crepitation. No use of accessory muscles of respiration.  CARDIOVASCULAR: S1, S2 normal. No murmurs, rubs, or gallops.  ABDOMEN: Soft, nontender, distended. Bowel sounds present. No organomegaly or mass.  EXTREMITIES: No pedal edema, cyanosis, or clubbing.  NEUROLOGIC: Patient is noticed to be lethargic and arousable to verbal commands, answers few questions and falling asleep PSYCHIATRIC: The patient is pleasantly confused.  SKIN: No  obvious rash, lesion, or ulcer.   LABORATORY PANEL:   CBC  Recent Labs Lab 03/14/15 0339  WBC 13.4*  HGB 12.2*  HCT 36.6*  PLT 129*   ------------------------------------------------------------------------------------------------------------------  Chemistries   Recent Labs Lab 03/14/15 0339  NA 136  K 4.7  CL 102  CO2 22  GLUCOSE 115*  BUN 68*  CREATININE 3.29*  CALCIUM 9.9  AST 71*  ALT 45  ALKPHOS 76  BILITOT 2.0*   ------------------------------------------------------------------------------------------------------------------  Cardiac Enzymes  Recent Labs Lab 03/13/15 1658  TROPONINI 0.04*   ------------------------------------------------------------------------------------------------------------------  RADIOLOGY:  Dg Chest 2 View  03/13/2015  CLINICAL DATA:  Weakness since yesterday.  Extremity edema. EXAM: CHEST  2 VIEW COMPARISON:  02/19/2015 FINDINGS: There is mild bilateral interstitial thickening. There is a trace left pleural effusion. There is no focal parenchymal opacity. There is no pneumothorax. There is stable cardiomegaly. There is evidence of prior CABG. There is thoracic aortic atherosclerosis. The osseous structures are unremarkable. IMPRESSION: Findings most concerning for mild CHF. Electronically Signed   By: Elige Ko   On: 03/13/2015 17:49   Ct Head Wo Contrast  03/13/2015  CLINICAL DATA:  81 year old male  with weakness since yesterday. History of fall yesterday. Prior history of stroke. EXAM: CT HEAD WITHOUT CONTRAST CT CERVICAL SPINE WITHOUT CONTRAST TECHNIQUE: Multidetector CT imaging of the head and cervical spine was performed following the standard protocol without intravenous contrast. Multiplanar CT image reconstructions of the cervical spine were also generated. COMPARISON:  Head CT 07/10/2014. FINDINGS: CT HEAD FINDINGS Mild cerebral atrophy. Patchy and confluent areas of decreased attenuation are noted throughout the deep  and periventricular white matter of the cerebral hemispheres bilaterally, compatible with chronic microvascular ischemic disease. Fatty infiltration along the anterior aspect of the falx cerebri again noted, without a well-defined lipoma. No acute displaced skull fractures are identified. No acute intracranial abnormality. Specifically, no evidence of acute post-traumatic intracranial hemorrhage, no definite regions of acute/subacute cerebral ischemia, no focal mass, mass effect, hydrocephalus or abnormal intra or extra-axial fluid collections. The visualized paranasal sinuses and mastoids are well pneumatized. CT CERVICAL SPINE FINDINGS No acute displaced fracture of the cervical spine. Straightening of normal cervical lordosis. Alignment is otherwise anatomic. Prevertebral soft tissues are normal. Multilevel degenerative disc disease, most severe at C6-C7. Multilevel facet arthropathy. Visualized portions of the upper thorax demonstrates a left pleural effusion. IMPRESSION: 1. No evidence of significant acute traumatic injury to the skull, brain or cervical spine. 2. Left pleural effusion incidentally imaged in the upper left hemithorax. Correlation with chest radiograph is recommended. 3. Mild cerebral atrophy with extensive chronic microvascular ischemic changes in the cerebral white matter. 4. Mild multilevel degenerative disc disease and cervical spondylosis, as above. Electronically Signed   By: Trudie Reed M.D.   On: 03/13/2015 17:49   Ct Cervical Spine Wo Contrast  03/13/2015  CLINICAL DATA:  81 year old male with weakness since yesterday. History of fall yesterday. Prior history of stroke. EXAM: CT HEAD WITHOUT CONTRAST CT CERVICAL SPINE WITHOUT CONTRAST TECHNIQUE: Multidetector CT imaging of the head and cervical spine was performed following the standard protocol without intravenous contrast. Multiplanar CT image reconstructions of the cervical spine were also generated. COMPARISON:  Head CT  07/10/2014. FINDINGS: CT HEAD FINDINGS Mild cerebral atrophy. Patchy and confluent areas of decreased attenuation are noted throughout the deep and periventricular white matter of the cerebral hemispheres bilaterally, compatible with chronic microvascular ischemic disease. Fatty infiltration along the anterior aspect of the falx cerebri again noted, without a well-defined lipoma. No acute displaced skull fractures are identified. No acute intracranial abnormality. Specifically, no evidence of acute post-traumatic intracranial hemorrhage, no definite regions of acute/subacute cerebral ischemia, no focal mass, mass effect, hydrocephalus or abnormal intra or extra-axial fluid collections. The visualized paranasal sinuses and mastoids are well pneumatized. CT CERVICAL SPINE FINDINGS No acute displaced fracture of the cervical spine. Straightening of normal cervical lordosis. Alignment is otherwise anatomic. Prevertebral soft tissues are normal. Multilevel degenerative disc disease, most severe at C6-C7. Multilevel facet arthropathy. Visualized portions of the upper thorax demonstrates a left pleural effusion. IMPRESSION: 1. No evidence of significant acute traumatic injury to the skull, brain or cervical spine. 2. Left pleural effusion incidentally imaged in the upper left hemithorax. Correlation with chest radiograph is recommended. 3. Mild cerebral atrophy with extensive chronic microvascular ischemic changes in the cerebral white matter. 4. Mild multilevel degenerative disc disease and cervical spondylosis, as above. Electronically Signed   By: Trudie Reed M.D.   On: 03/13/2015 17:49    EKG:   Orders placed or performed during the hospital encounter of 03/13/15  . ED EKG  . ED EKG  . EKG 12-Lead  . EKG  12-Lead    IMPRESSION AND PLAN:   Blake White is a 81 y.o. male with a known history of chronic atrial fibrillation, on Coumadin, congestive heart failure, hypothyroidism, chronic liver cirrhosis  and recurrent C. difficile colitis was just admitted to the hospital and treated with by mouth vancomycin for C. difficile colitis. Patient finished his antibiotic course just 1 week ago but still having ongoing diarrhea. For the past 2 days his diarrhea has been worse and copious. Patient was feeling weak and lightheaded and sustained a fall but did not pass out. According to family members patient is slightly confused and not being himself   1. Altered mental status secondary to Recurrent C. difficile colitis No improvement with oral vancomycin, completed antibiotic course last week. patient is still having diarrhea and stool is positive for C. difficile  We will consult gastroenterology as they were considering fecal transplantation during last visit and there is no clinical improvement with oral vancomycin Continue by mouth probiotics  2. Acute on chronic kidney injury could be from poor by mouth intake and ongoing diarrhea Will avoid nephrotoxins Nephrology consult is placed Patient has mild CHF and chest x-ray, will continue patient's home medication diuretics and monitor renal function closely   3. Mild CHF Continue home medications spironolactone and Lasix as patient is reporting exertional dyspnea Continue amiodarone Daily weights and monitor intake and output  4. Chronic history of atrial fibrillation rate controlled Continue Coumadin currently not therapeutic Pharmacy consult is placed for Coumadin management Check PT/INR in a.m.   5. Hypothyroidism Continue Synthyroid  6. Generalized weakness with near syncope Probably prerenal from dehydration and ongoing diarrhea We will consult physical therapy Patient is on probiotics  7. Recent history of acute esophagitis Treat with Pepcid       All the records are reviewed and case discussed with ED provider. Management plans discussed with the patient, family and they are in agreement.  CODE STATUS: Full code, wife is  the healthcare power of attorney Greater than 50% time was spent in urination of care and face-to-face counseling  TOTAL TIME TAKING CARE OF THIS PATIENT: 45 minutes.    Ramonita LabGouru, Bertha Lokken M.D on 03/14/2015 at 10:23 AM  Between 7am to 6pm - Pager - [phone]  After 6pm go to [email or web address] - password [email or web address] [facility]  [facility]  Office  [phone]  CC: Primary care physician; [name], Charanjit, MD

## 2015-03-14 NOTE — Consult Note (Signed)
[facility]  [address], [address] [place], [place] [address] Phone: [phone] Fax : [phone]  Consultation  Referring Provider:     No ref. provider found Primary Care Physician:  Sula Rumple, MD Primary Gastroenterologist:  Dr. Shelle Iron         Reason for Consultation:     C. difficile colitis  Date of Admission:  03/13/2015 Date of Consultation:  03/14/2015         HPI:   Blake White is a 81 y.o. male who has recurrent C. difficile colitis. The patient has been in the hospital multiple times for this. The patient's last admission was back in October and he was treated for C. difficile and his wife states he did not completely improve and now has been very weak and tired and sleeping quite often area the patient continues to have diarrhea. The patient was brought to the emergency room and found to have C. difficile colitis again. He reportedly just finished a one-week course of having antibiotics and was still having diarrhea. He was also reported to have fallen when the phone was ringing and he was walking towards the phone. The patient is sleepy at the time of the interview but the information was gotten from his wife.  Past Medical History  Diagnosis Date  . CHF (congestive heart failure) (HCC)   . BPH (benign prostatic hyperplasia)   . A-fib (HCC)   . Thyroid disease   . High cholesterol   . Bilateral lower extremity edema   . CAD (coronary artery disease)   . Cirrhosis (HCC)   . CVA (cerebral vascular accident) (HCC)   . Gout   . Hypothyroidism   . Myocardial infarction (HCC)   . Obesity     Past Surgical History  Procedure Laterality Date  . Vein bypass surgery    . Vascular surgery    . Cardiac surgery      quad bypass  . Esophagogastroduodenoscopy (egd) with propofol [name] 03/04/2015    Procedure: ESOPHAGOGASTRODUODENOSCOPY (EGD) WITH PROPOFOL;  Surgeon: Elnita Maxwell, MD;  Location: [facility] ENDOSCOPY;  Service: Endoscopy;  Laterality: [name];      Prior to Admission medications   Medication Sig Start Date End Date Taking? Authorizing Provider  allopurinol (ZYLOPRIM) 300 MG tablet Take 150 mg by mouth daily. 11/20/14  Yes Historical Provider, MD  amiodarone (PACERONE) 200 MG tablet Take 200 mg by mouth daily. 10/20/14  Yes Historical Provider, MD  aspirin EC 81 MG tablet Take 81 mg by mouth daily. 03/11/14  Yes Historical Provider, MD  Cholecalciferol (VITAMIN D3) 1000 UNITS CAPS Take 1,000 Units by mouth daily. 03/11/14  Yes Historical Provider, MD  furosemide (LASIX) 40 MG tablet Take 40 mg by mouth daily. 02/16/15  Yes Historical Provider, MD  levothyroxine (SYNTHROID, LEVOTHROID) 150 MCG tablet Take 150 mcg by mouth daily before breakfast. 11/02/14  Yes Historical Provider, MD  metolazone (ZAROXOLYN) 5 MG tablet Take 5 mg by mouth daily. 03/11/15  Yes Historical Provider, MD  midodrine (PROAMATINE) 5 MG tablet Take 1 tablet (5 mg total) by mouth 3 (three) times daily with meals. 01/20/15  Yes Ramonita Lab, MD  simvastatin (ZOCOR) 40 MG tablet Take 40 mg by mouth at bedtime. 11/26/14  Yes Historical Provider, MD  spironolactone (ALDACTONE) 100 MG tablet Take 1 tablet (100 mg total) by mouth daily. 01/20/15  Yes Ramonita Lab, MD  warfarin (COUMADIN) 3 MG tablet Take 1 tablet (3 mg total) by mouth every evening. 01/12/15  Yes Enedina Finner, MD  furosemide (LASIX) 40 MG tablet Take 1 tablet (40 mg total) by mouth 2 (two) times daily. 01/20/15   Ramonita Lab, MD  saccharomyces boulardii (FLORASTOR) 250 MG capsule Take 1 capsule (250 mg total) by mouth 2 (two) times daily. 02/19/15   Myrna Blazer, MD  vancomycin (VANCOCIN) 125 MG capsule Take 1 capsule (125 mg total) by mouth 4 (four) times daily. Patient not taking: Reported on 03/04/2015 02/19/15   Myrna Blazer, MD  vancomycin (VANCOCIN) 50 mg/mL oral solution Take 2.5 mLs (125 mg total) by mouth every 6 (six) hours. 02/19/15   Myrna Blazer, MD    Family History   Problem Relation Age of Onset  . Diabetes Mellitus II Mother   . Lung cancer Brother      Social History  Substance Use Topics  . Smoking status: Former Games developer  . Smokeless tobacco: Never Used  . Alcohol Use: No    Allergies as of 03/13/2015  . (No Known Allergies)    Review of Systems:    All systems reviewed and negative except where noted in HPI.   Physical Exam:  Vital signs in last 24 hours: Temp:  [97.8 F (36.6 C)-98.7 F (37.1 C)] 97.8 F (36.6 C) (11/06 0400) Pulse Rate:  [63-71] 71 (11/06 0400) Resp:  [18-20] 18 (11/05 1952) BP: (90-123)/(48-62) 111/53 mmHg (11/06 0400) SpO2:  [97 %-100 %] 100 % (11/06 0400) Weight:  [226 lb (102.513 kg)] 226 lb (102.513 kg) (11/05 1612) Last BM Date: 03/13/15 General:   Pleasant, cooperative in NAD Head:  Normocephalic and atraumatic. Eyes:   No icterus.   Conjunctiva pink. PERRLA. Ears:  Normal auditory acuity. Neck:  Supple; no masses or thyroidomegaly Lungs: Respirations even and unlabored. Lungs clear to auscultation bilaterally.   No wheezes, crackles, or rhonchi.  Heart:  Regular rate and rhythm;  Without murmur, clicks, rubs or gallops Abdomen:  Soft, nondistended, nontender. Normal bowel sounds. No appreciable masses or hepatomegaly.  No rebound or guarding.  Rectal:  Not performed. Msk:  Symmetrical without gross deformities.    Extremities Bilateral edema, cyanosis or clubbing. Neurologic:  Alert and oriented x3;  grossly normal neurologically. Skin:  Intact without significant lesions or rashes. Cervical Nodes:  No significant cervical adenopathy. Psych:  Alert and cooperative. Normal affect.  LAB RESULTS:  Recent Labs  03/13/15 1658 03/14/15 0339  WBC 9.5 13.4*  HGB 12.0* 12.2*  HCT 36.1* 36.6*  PLT 137* 129*   BMET  Recent Labs  03/13/15 1658 03/14/15 0339  NA 135 136  K 4.5 4.7  CL 103 102  CO2 23 22  GLUCOSE 107* 115*  BUN 70* 68*  CREATININE 3.33* 3.29*  CALCIUM 9.7 9.9    LFT  Recent Labs  03/14/15 0339  PROT 5.9*  ALBUMIN 2.7*  AST 71*  ALT 45  ALKPHOS 76  BILITOT 2.0*   PT/INR  Recent Labs  03/13/15 1658 03/14/15 0339  LABPROT 20.0* 23.1*  INR 1.68 2.03    STUDIES: Dg Chest 2 View  03/13/2015  CLINICAL DATA:  Weakness since yesterday.  Extremity edema. EXAM: CHEST  2 VIEW COMPARISON:  02/19/2015 FINDINGS: There is mild bilateral interstitial thickening. There is a trace left pleural effusion. There is no focal parenchymal opacity. There is no pneumothorax. There is stable cardiomegaly. There is evidence of prior CABG. There is thoracic aortic atherosclerosis. The osseous structures are unremarkable. IMPRESSION: Findings most concerning for mild CHF. Electronically Signed   By:  Elige Ko   On: 03/13/2015 17:49   Ct Head Wo Contrast  03/13/2015  CLINICAL DATA:  81 year old male with weakness since yesterday. History of fall yesterday. Prior history of stroke. EXAM: CT HEAD WITHOUT CONTRAST CT CERVICAL SPINE WITHOUT CONTRAST TECHNIQUE: Multidetector CT imaging of the head and cervical spine was performed following the standard protocol without intravenous contrast. Multiplanar CT image reconstructions of the cervical spine were also generated. COMPARISON:  Head CT 07/10/2014. FINDINGS: CT HEAD FINDINGS Mild cerebral atrophy. Patchy and confluent areas of decreased attenuation are noted throughout the deep and periventricular white matter of the cerebral hemispheres bilaterally, compatible with chronic microvascular ischemic disease. Fatty infiltration along the anterior aspect of the falx cerebri again noted, without a well-defined lipoma. No acute displaced skull fractures are identified. No acute intracranial abnormality. Specifically, no evidence of acute post-traumatic intracranial hemorrhage, no definite regions of acute/subacute cerebral ischemia, no focal mass, mass effect, hydrocephalus or abnormal intra or extra-axial fluid collections. The  visualized paranasal sinuses and mastoids are well pneumatized. CT CERVICAL SPINE FINDINGS No acute displaced fracture of the cervical spine. Straightening of normal cervical lordosis. Alignment is otherwise anatomic. Prevertebral soft tissues are normal. Multilevel degenerative disc disease, most severe at C6-C7. Multilevel facet arthropathy. Visualized portions of the upper thorax demonstrates a left pleural effusion. IMPRESSION: 1. No evidence of significant acute traumatic injury to the skull, brain or cervical spine. 2. Left pleural effusion incidentally imaged in the upper left hemithorax. Correlation with chest radiograph is recommended. 3. Mild cerebral atrophy with extensive chronic microvascular ischemic changes in the cerebral white matter. 4. Mild multilevel degenerative disc disease and cervical spondylosis, as above. Electronically Signed   By: Trudie Reed M.D.   On: 03/13/2015 17:49   Ct Cervical Spine Wo Contrast  03/13/2015  CLINICAL DATA:  81 year old male with weakness since yesterday. History of fall yesterday. Prior history of stroke. EXAM: CT HEAD WITHOUT CONTRAST CT CERVICAL SPINE WITHOUT CONTRAST TECHNIQUE: Multidetector CT imaging of the head and cervical spine was performed following the standard protocol without intravenous contrast. Multiplanar CT image reconstructions of the cervical spine were also generated. COMPARISON:  Head CT 07/10/2014. FINDINGS: CT HEAD FINDINGS Mild cerebral atrophy. Patchy and confluent areas of decreased attenuation are noted throughout the deep and periventricular white matter of the cerebral hemispheres bilaterally, compatible with chronic microvascular ischemic disease. Fatty infiltration along the anterior aspect of the falx cerebri again noted, without a well-defined lipoma. No acute displaced skull fractures are identified. No acute intracranial abnormality. Specifically, no evidence of acute post-traumatic intracranial hemorrhage, no definite  regions of acute/subacute cerebral ischemia, no focal mass, mass effect, hydrocephalus or abnormal intra or extra-axial fluid collections. The visualized paranasal sinuses and mastoids are well pneumatized. CT CERVICAL SPINE FINDINGS No acute displaced fracture of the cervical spine. Straightening of normal cervical lordosis. Alignment is otherwise anatomic. Prevertebral soft tissues are normal. Multilevel degenerative disc disease, most severe at C6-C7. Multilevel facet arthropathy. Visualized portions of the upper thorax demonstrates a left pleural effusion. IMPRESSION: 1. No evidence of significant acute traumatic injury to the skull, brain or cervical spine. 2. Left pleural effusion incidentally imaged in the upper left hemithorax. Correlation with chest radiograph is recommended. 3. Mild cerebral atrophy with extensive chronic microvascular ischemic changes in the cerebral white matter. 4. Mild multilevel degenerative disc disease and cervical spondylosis, as above. Electronically Signed   By: Trudie Reed M.D.   On: 03/13/2015 17:49      Impression / Plan:  Blake White is a 81 y.o. y/o male with recurrent C. difficile colitis. The patient was recently on antibiotics and his diarrhea never stopped and he continued to have lethargy. The patient was found in the ER to have C. difficile positive   the patient was reported to have no improvement of his symptoms on oral vancomycin. I'm now being consult at for consideration of fecal transplant. I will contact Dr. Earnest ConroyElliott's to see if he will evaluate the patient for possible stool transplant.  Thank you for involving me in the care of this patient.      LOS: 1 day   Darlina Rumpfaren Dushawn Pusey, MD  03/14/2015, 12:51 PM   Note: This dictation was prepared with Dragon dictation along with smaller phrase technology. Any transcriptional errors that result from this process are unintentional.

## 2015-03-15 ENCOUNTER — Inpatient Hospital Stay: Payer: Medicare Other

## 2015-03-15 LAB — PROTIME-INR
INR: 2.59
PROTHROMBIN TIME: 27.9 s — AB (ref 11.4–15.0)

## 2015-03-15 MED ORDER — SODIUM CHLORIDE 0.9 % IV SOLN
INTRAVENOUS | Status: DC
  Administered 2015-03-15 – 2015-03-16 (×2): via INTRAVENOUS

## 2015-03-15 NOTE — Consult Note (Signed)
ANTICOAGULATION CONSULT NOTE - Follow Up Consult  Pharmacy Consult for warfarin Indication: VTE prophylaxis  No Known Allergies  Patient Measurements: Height: 5\' 8"  (172.7 cm) Weight: 226 lb (102.513 kg) IBW/kg (Calculated) : 68.4 Heparin Dosing Weight:   Vital Signs: Temp: 98.6 F (37 C) (11/07 0542) Temp Source: Oral (11/07 0542) BP: 103/45 mmHg (11/07 0542) Pulse Rate: 66 (11/07 0542)  Labs:  Recent Labs  03/13/15 1658 03/14/15 0339 03/15/15 0459  HGB 12.0* 12.2*  --   HCT 36.1* 36.6*  --   PLT 137* 129*  --   LABPROT 20.0* 23.1* 27.9*  INR 1.68 2.03 2.59  CREATININE 3.33* 3.29*  --   TROPONINI 0.04*  --   --     Estimated Creatinine Clearance: 21.1 mL/min (by C-G formula based on Cr of 3.29).   Medications:  Scheduled:  . amiodarone  200 mg Oral Daily  . aspirin EC  81 mg Oral Daily  . atorvastatin  20 mg Oral QHS  . cholecalciferol  1,000 Units Oral Daily  . famotidine  20 mg Oral Daily  . levothyroxine  150 mcg Oral QAC breakfast  . metronidazole  500 mg Intravenous Q6H  . midodrine  5 mg Oral TID WC  . saccharomyces boulardii  250 mg Oral BID  . vancomycin  500 mg Oral 4 times per day  . warfarin  3 mg Oral q1800  . Warfarin - Pharmacist Dosing Inpatient   Does not apply q1800    Assessment: Pt is a 81 year old male who presents to the ED after a fall. Pt has had several days of diarrhea, following cdiff tx a few weeks ago. Pts scans are negative for a bleed due to his fall. INR on arrival is subtherapeutic at 1.68. Pt home dose is 3mg  daily. Pt is on warfarin for Afib.  Goal of Therapy:  INR 2-3 Monitor platelets by anticoagulation protocol: Yes   Plan:  Since INR is subtherapeutic will give 4mg  tonight. Pts current illness/potentially being restarted on antibiotics both have potential to raise INR. Will monitor closely. Will recheck INR in the AM.  11/6 INR 2.03. Restart home dose of 3 mg daily. INR in AM.  11/7 INR 2.59. Continue 3 mg  daily. Daily INR ordered while pt is on antibiotics.  Mikahla Wisor S 03/15/2015,6:23 AM

## 2015-03-15 NOTE — Progress Notes (Signed)
Alert and oriented. No signs of acute distress. Transported to special recovery for TEE.

## 2015-03-15 NOTE — Consult Note (Signed)
I saw Mr. Tanya Nonesickard today and discussed a possible stool transplant with him and his wife.  I normally do this after a 2 week treatment with vancomycin.  It can be done sooner but success odds are less.   He is weak and has elevated BNP.  I would like him improved some if possible before putting him through a bowel prep and colonoscopy.  His flare of colitis a week after stopping the last round of vancomycin is typical for many who do flare up after treatment.  Will follow with you.

## 2015-03-15 NOTE — Consult Note (Signed)
WOC wound consult note Reason for Consult:Skin tear to left dorsal arm from fall at home.  Wound type:Trauma Pressure Ulcer POA: [name] Measurement:2 lesions 3 cm x 0.2 cm 2.4 cm x 0.3 cm x 0.1 cm edges not approximated, skin defect present Wound bed:100% pale pink and moist Drainage (amount, consistency, odor) Bleeds with dressing removal and cleansing.   Periwound:Ecchymosis to left arm from fall.  Takes Coumadin Dressing procedure/placement/frequency:Cleanse skin tear to left arm with NS and pat gently dry.  Apply Mepitel silicone contact layer to wound bed.  Cover with 4x4 gauze, kerlix and tape.   Change Mon/Wed/Fri Will not follow at this time.  Please re-consult if needed.  Maple HudsonKaren Truett Mcfarlan RN BSN CWON Pager [phone]

## 2015-03-15 NOTE — Progress Notes (Signed)
Subjective:   Wife at bedside Patient somnulent. Continues to have diarrhea.  Complains of left hip pain.   Objective:  Vital signs in last 24 hours:  Temp:  [98.4 F (36.9 C)-98.6 F (37 C)] 98.6 F (37 C) (11/07 0542) Pulse Rate:  [66-68] 66 (11/07 0542) Resp:  [18] 18 (11/06 1322) BP: (103-114)/(45-58) 103/45 mmHg (11/07 0542) SpO2:  [97 %-100 %] 97 % (11/07 0542)  Weight change:  Filed Weights   03/13/15 1612  Weight: 102.513 kg (226 lb)    Intake/Output:    Intake/Output Summary (Last 24 hours) at 03/15/15 1125 Last data filed at 03/15/15 0600  Gross per 24 hour  Intake    120 ml  Output   1250 ml  Net  -1130 ml     Physical Exam: General: NAD, lethargic  HEENT Anicteric, dry oral mucosal membranes  Neck supple  Pulm/lungs Normal effort, clear ant and laterally  CVS/Heart No rub or gallop  Abdomen:  Soft, distended  Extremities: + dependent edema  Neurologic: Lethargic, answering questions  Skin: Left olecrenon laceration, in dressings   GU Foley catheter, some gross hematuria       Basic Metabolic Panel:   Recent Labs Lab 03/13/15 1658 03/14/15 0339  NA 135 136  K 4.5 4.7  CL 103 102  CO2 23 22  GLUCOSE 107* 115*  BUN 70* 68*  CREATININE 3.33* 3.29*  CALCIUM 9.7 9.9     CBC:  Recent Labs Lab 03/13/15 1658 03/14/15 0339  WBC 9.5 13.4*  HGB 12.0* 12.2*  HCT 36.1* 36.6*  MCV 101.6* 101.9*  PLT 137* 129*      Microbiology:  Recent Results (from the past 720 hour(s))  C difficile quick scan w PCR reflex     Status: Abnormal   Collection Time: 02/19/15  6:49 PM  Result Value Ref Range Status   C Diff antigen POSITIVE (A) NEGATIVE Final   C Diff toxin POSITIVE (A) NEGATIVE Final   C Diff interpretation   Final    Positive for toxigenic C. difficile, active toxin production present.    Comment: CRITICAL RESULT CALLED TO, READ BACK BY AND VERIFIED WITH: [facility] ERDESKY AT 2054 02/19/15.PMH   C difficile quick scan w PCR  reflex     Status: Abnormal   Collection Time: 03/13/15  4:58 PM  Result Value Ref Range Status   C Diff antigen POSITIVE (A) NEGATIVE Final   C Diff toxin POSITIVE (A) NEGATIVE Final   C Diff interpretation   Final    Positive for toxigenic C. difficile, active toxin production present.    Comment: CRITICAL RESULT CALLED TO, READ BACK BY AND VERIFIED WITH: [facility] MOFFITT RN AT 1830 03/13/2015     Coagulation Studies:  Recent Labs  03/13/15 1658 03/14/15 0339 03/15/15 0459  LABPROT 20.0* 23.1* 27.9*  INR 1.68 2.03 2.59    Urinalysis:  Recent Labs  03/13/15 1658 03/14/15 1835  COLORURINE YELLOW* YELLOW*  LABSPEC 1.009 1.013  PHURINE 5.0 5.0  GLUCOSEU NEGATIVE NEGATIVE  HGBUR NEGATIVE NEGATIVE  BILIRUBINUR NEGATIVE NEGATIVE  KETONESUR NEGATIVE NEGATIVE  PROTEINUR NEGATIVE NEGATIVE  NITRITE NEGATIVE NEGATIVE  LEUKOCYTESUR NEGATIVE NEGATIVE      Imaging: Dg Chest 2 View  03/13/2015  CLINICAL DATA:  Weakness since yesterday.  Extremity edema. EXAM: CHEST  2 VIEW COMPARISON:  02/19/2015 FINDINGS: There is mild bilateral interstitial thickening. There is a trace left pleural effusion. There is no focal parenchymal opacity. There is no pneumothorax. There is stable  cardiomegaly. There is evidence of prior CABG. There is thoracic aortic atherosclerosis. The osseous structures are unremarkable. IMPRESSION: Findings most concerning for mild CHF. Electronically Signed   By: Elige Ko   On: 03/13/2015 17:49   Ct Head Wo Contrast  03/13/2015  CLINICAL DATA:  81 year old male with weakness since yesterday. History of fall yesterday. Prior history of stroke. EXAM: CT HEAD WITHOUT CONTRAST CT CERVICAL SPINE WITHOUT CONTRAST TECHNIQUE: Multidetector CT imaging of the head and cervical spine was performed following the standard protocol without intravenous contrast. Multiplanar CT image reconstructions of the cervical spine were also generated. COMPARISON:  Head CT 07/10/2014.  FINDINGS: CT HEAD FINDINGS Mild cerebral atrophy. Patchy and confluent areas of decreased attenuation are noted throughout the deep and periventricular white matter of the cerebral hemispheres bilaterally, compatible with chronic microvascular ischemic disease. Fatty infiltration along the anterior aspect of the falx cerebri again noted, without a well-defined lipoma. No acute displaced skull fractures are identified. No acute intracranial abnormality. Specifically, no evidence of acute post-traumatic intracranial hemorrhage, no definite regions of acute/subacute cerebral ischemia, no focal mass, mass effect, hydrocephalus or abnormal intra or extra-axial fluid collections. The visualized paranasal sinuses and mastoids are well pneumatized. CT CERVICAL SPINE FINDINGS No acute displaced fracture of the cervical spine. Straightening of normal cervical lordosis. Alignment is otherwise anatomic. Prevertebral soft tissues are normal. Multilevel degenerative disc disease, most severe at C6-C7. Multilevel facet arthropathy. Visualized portions of the upper thorax demonstrates a left pleural effusion. IMPRESSION: 1. No evidence of significant acute traumatic injury to the skull, brain or cervical spine. 2. Left pleural effusion incidentally imaged in the upper left hemithorax. Correlation with chest radiograph is recommended. 3. Mild cerebral atrophy with extensive chronic microvascular ischemic changes in the cerebral white matter. 4. Mild multilevel degenerative disc disease and cervical spondylosis, as above. Electronically Signed   By: Trudie Reed M.D.   On: 03/13/2015 17:49   Ct Cervical Spine Wo Contrast  03/13/2015  CLINICAL DATA:  81 year old male with weakness since yesterday. History of fall yesterday. Prior history of stroke. EXAM: CT HEAD WITHOUT CONTRAST CT CERVICAL SPINE WITHOUT CONTRAST TECHNIQUE: Multidetector CT imaging of the head and cervical spine was performed following the standard protocol  without intravenous contrast. Multiplanar CT image reconstructions of the cervical spine were also generated. COMPARISON:  Head CT 07/10/2014. FINDINGS: CT HEAD FINDINGS Mild cerebral atrophy. Patchy and confluent areas of decreased attenuation are noted throughout the deep and periventricular white matter of the cerebral hemispheres bilaterally, compatible with chronic microvascular ischemic disease. Fatty infiltration along the anterior aspect of the falx cerebri again noted, without a well-defined lipoma. No acute displaced skull fractures are identified. No acute intracranial abnormality. Specifically, no evidence of acute post-traumatic intracranial hemorrhage, no definite regions of acute/subacute cerebral ischemia, no focal mass, mass effect, hydrocephalus or abnormal intra or extra-axial fluid collections. The visualized paranasal sinuses and mastoids are well pneumatized. CT CERVICAL SPINE FINDINGS No acute displaced fracture of the cervical spine. Straightening of normal cervical lordosis. Alignment is otherwise anatomic. Prevertebral soft tissues are normal. Multilevel degenerative disc disease, most severe at C6-C7. Multilevel facet arthropathy. Visualized portions of the upper thorax demonstrates a left pleural effusion. IMPRESSION: 1. No evidence of significant acute traumatic injury to the skull, brain or cervical spine. 2. Left pleural effusion incidentally imaged in the upper left hemithorax. Correlation with chest radiograph is recommended. 3. Mild cerebral atrophy with extensive chronic microvascular ischemic changes in the cerebral white matter. 4. Mild multilevel degenerative disc  disease and cervical spondylosis, as above. Electronically Signed   By: Trudie Reedaniel  Entrikin M.D.   On: 03/13/2015 17:49     Medications:     . amiodarone  200 mg Oral Daily  . aspirin EC  81 mg Oral Daily  . atorvastatin  20 mg Oral QHS  . cholecalciferol  1,000 Units Oral Daily  . famotidine  20 mg Oral Daily   . levothyroxine  150 mcg Oral QAC breakfast  . metronidazole  500 mg Intravenous Q6H  . midodrine  5 mg Oral TID WC  . saccharomyces boulardii  250 mg Oral BID  . vancomycin  500 mg Oral 4 times per day  . warfarin  3 mg Oral q1800  . Warfarin - Pharmacist Dosing Inpatient   Does not apply q1800   acetaminophen **OR** acetaminophen, oxyCODONE  Assessment/ Plan:  81 y.o. white male with congestive heart failure, atrial fibrillation, chronic anticoagulation, hypothyroidism, BPH, hyperlipidemia, hypertension, coronary artery disease status post CABG,Liver cirrhosis who [facility] C. Diff colitis.   1. Acute kidney failure on chronic kidney disease stage IV. Baseline 2.78/GFR 20. [facility] as [facility].  - concerning for prerenal azotemia with intravascular depletion. Even though with peripheral and dependent edema.  - start IV NS at 50. Hold diuretics. Monitor volume status carefully.   2. Edema: with history of hepatic cirrhosis.  - holding diuretics. Home regimen of spironolactone, metolazone and furosemide.   3. Hypotension: blood pressure at goal - continue midodrine.   4. Infective Colitis: C. Diff A09 - on metronidazole and vancomycin.  - probiotic prescribed.  - Appreciate GI input.     LOS: 2 Blake White 11/7/201611:25 AM

## 2015-03-15 NOTE — Progress Notes (Signed)
[facility] [place] [facility]   PATIENT NAME: Blake White    MR#:  [id number]  DATE OF BIRTH:  1934-02-08  SUBJECTIVE:  CHIEF COMPLAINT:   Chief Complaint  Patient presents with  . Fall  . Weakness  . Diarrhea   Very uncomfortable, pain in left hip  REVIEW OF SYSTEMS:   Review of Systems  Constitutional: Positive for malaise/fatigue. Negative for fever.  Respiratory: Negative for shortness of breath.   Cardiovascular: Negative for chest pain and palpitations.  Gastrointestinal: Positive for abdominal pain and diarrhea. Negative for nausea, vomiting and blood in stool.  Genitourinary: Negative for dysuria.  Neurological: Positive for weakness.    DRUG ALLERGIES:  No Known Allergies  VITALS:  Blood pressure 103/45, pulse 66, temperature 98.6 F (37 C), temperature source Oral, resp. rate 18, height  (1.727 m), weight 102.513 kg (226 lb), SpO2 97 %.  PHYSICAL EXAMINATION:  GENERAL:  81 y.o.-year-old patient lying in the bed, uncomfortable LUNGS: Normal breath sounds bilaterally, no wheezing, rales,rhonchi or crepitation. No use of accessory muscles of respiration.  CARDIOVASCULAR: S1, S2 normal. No murmurs, rubs, or gallops.  ABDOMEN: Soft, nontender, distended, tympanic. Bowel sounds present. No organomegaly or mass.  EXTREMITIES: No pedal edema, cyanosis, or clubbing.  NEUROLOGIC: Cranial nerves II through XII are intact. Muscle strength 5/5 in all extremities. Sensation intact. Gait not checked.  PSYCHIATRIC: The patient is alert and oriented x 3.  SKIN: No obvious rash, lesion, or ulcer.    LABORATORY PANEL:   CBC  Recent Labs Lab 03/14/15 0339  WBC 13.4*  HGB 12.2*  HCT 36.6*  PLT 129*   ------------------------------------------------------------------------------------------------------------------  Chemistries   Recent Labs Lab 03/14/15 0339  NA 136  K 4.7  CL 102  CO2 22  GLUCOSE 115*  BUN 68*   CREATININE 3.29*  CALCIUM 9.9  AST 71*  ALT 45  ALKPHOS 76  BILITOT 2.0*   ------------------------------------------------------------------------------------------------------------------  Cardiac Enzymes  Recent Labs Lab 03/13/15 1658  TROPONINI 0.04*   ------------------------------------------------------------------------------------------------------------------  RADIOLOGY:  Dg Chest 2 View  03/13/2015  CLINICAL DATA:  Weakness since yesterday.  Extremity edema. EXAM: CHEST  2 VIEW COMPARISON:  02/19/2015 FINDINGS: There is mild bilateral interstitial thickening. There is a trace left pleural effusion. There is no focal parenchymal opacity. There is no pneumothorax. There is stable cardiomegaly. There is evidence of prior CABG. There is thoracic aortic atherosclerosis. The osseous structures are unremarkable. IMPRESSION: Findings most concerning for mild CHF. Electronically Signed   By: Elige Ko   On: 03/13/2015 17:49   Ct Head Wo Contrast  03/13/2015  CLINICAL DATA:  81 year old male with weakness since yesterday. History of fall yesterday. Prior history of stroke. EXAM: CT HEAD WITHOUT CONTRAST CT CERVICAL SPINE WITHOUT CONTRAST TECHNIQUE: Multidetector CT imaging of the head and cervical spine was performed following the standard protocol without intravenous contrast. Multiplanar CT image reconstructions of the cervical spine were also generated. COMPARISON:  Head CT 07/10/2014. FINDINGS: CT HEAD FINDINGS Mild cerebral atrophy. Patchy and confluent areas of decreased attenuation are noted throughout the deep and periventricular white matter of the cerebral hemispheres bilaterally, compatible with chronic microvascular ischemic disease. Fatty infiltration along the anterior aspect of the falx cerebri again noted, without a well-defined lipoma. No acute displaced skull fractures are identified. No acute intracranial abnormality. Specifically, no evidence of acute post-traumatic  intracranial hemorrhage, no definite regions of acute/subacute cerebral ischemia, no focal mass, mass effect, hydrocephalus or abnormal intra or extra-axial  fluid collections. The visualized paranasal sinuses and mastoids are well pneumatized. CT CERVICAL SPINE FINDINGS No acute displaced fracture of the cervical spine. Straightening of normal cervical lordosis. Alignment is otherwise anatomic. Prevertebral soft tissues are normal. Multilevel degenerative disc disease, most severe at C6-C7. Multilevel facet arthropathy. Visualized portions of the upper thorax demonstrates a left pleural effusion. IMPRESSION: 1. No evidence of significant acute traumatic injury to the skull, brain or cervical spine. 2. Left pleural effusion incidentally imaged in the upper left hemithorax. Correlation with chest radiograph is recommended. 3. Mild cerebral atrophy with extensive chronic microvascular ischemic changes in the cerebral white matter. 4. Mild multilevel degenerative disc disease and cervical spondylosis, as above. Electronically Signed   By: Trudie Reedaniel  Entrikin M.D.   On: 03/13/2015 17:49   Ct Cervical Spine Wo Contrast  03/13/2015  CLINICAL DATA:  81 year old male with weakness since yesterday. History of fall yesterday. Prior history of stroke. EXAM: CT HEAD WITHOUT CONTRAST CT CERVICAL SPINE WITHOUT CONTRAST TECHNIQUE: Multidetector CT imaging of the head and cervical spine was performed following the standard protocol without intravenous contrast. Multiplanar CT image reconstructions of the cervical spine were also generated. COMPARISON:  Head CT 07/10/2014. FINDINGS: CT HEAD FINDINGS Mild cerebral atrophy. Patchy and confluent areas of decreased attenuation are noted throughout the deep and periventricular white matter of the cerebral hemispheres bilaterally, compatible with chronic microvascular ischemic disease. Fatty infiltration along the anterior aspect of the falx cerebri again noted, without a well-defined  lipoma. No acute displaced skull fractures are identified. No acute intracranial abnormality. Specifically, no evidence of acute post-traumatic intracranial hemorrhage, no definite regions of acute/subacute cerebral ischemia, no focal mass, mass effect, hydrocephalus or abnormal intra or extra-axial fluid collections. The visualized paranasal sinuses and mastoids are well pneumatized. CT CERVICAL SPINE FINDINGS No acute displaced fracture of the cervical spine. Straightening of normal cervical lordosis. Alignment is otherwise anatomic. Prevertebral soft tissues are normal. Multilevel degenerative disc disease, most severe at C6-C7. Multilevel facet arthropathy. Visualized portions of the upper thorax demonstrates a left pleural effusion. IMPRESSION: 1. No evidence of significant acute traumatic injury to the skull, brain or cervical spine. 2. Left pleural effusion incidentally imaged in the upper left hemithorax. Correlation with chest radiograph is recommended. 3. Mild cerebral atrophy with extensive chronic microvascular ischemic changes in the cerebral white matter. 4. Mild multilevel degenerative disc disease and cervical spondylosis, as above. Electronically Signed   By: Trudie Reedaniel  Entrikin M.D.   On: 03/13/2015 17:49   Dg Hip Unilat With Pelvis 2-3 Views Left  03/15/2015  CLINICAL DATA:  Patient fell from standing 3 days ago onto his left side. Complaining of left hip pain. Unable to rotate leg. EXAM: DG HIP (WITH OR WITHOUT PELVIS) 2-3V LEFT COMPARISON:  None. FINDINGS: No fracture or bone lesion. Hip joints, SI joints and symphysis pubis are normally aligned. No significant hip joint arthropathic change. Bones are demineralized. There are iliac and femoral vascular calcifications. IMPRESSION: No fracture or dislocation. Electronically Signed   By: Amie Portlandavid  Ormond M.D.   On: 03/15/2015 12:56    EKG:   Orders placed or performed during the hospital encounter of 03/13/15  . ED EKG  . ED EKG  . EKG 12-Lead   . EKG 12-Lead    ASSESSMENT AND PLAN:   Sherryl MangesBobby Rominger is a 81 y.o. male with a known history of chronic atrial fibrillation, on Coumadin, congestive heart failure, hypothyroidism, chronic liver cirrhosis and recurrent C. difficile colitis was just admitted to the hospital and  treated with by mouth vancomycin for C. difficile colitis. Patient finished his antibiotic course just 1 week ago but still having ongoing diarrhea. For the past 2 days his diarrhea has been worse and copious. Patient was feeling weak and lightheaded and sustained a fall but did not pass out. According to family members patient is slightly confused and not being himself  1) recurrent Clostridium difficile colitis - Appreciate GI consultation and consideration for fecal transplant - on high dose oral vanc  #2 acute on chronic kidney disease stage III - Due to ATN volume depletion - Continue to avoid nephrotoxins, gentle hydration - appreciate nephrology following  #3 congestive heart failure with mild exacerbation -  hold spironolactone and Lasix, continue amiodarone - Continue daily weights I's and O's  #4 atrial fibrillation, chronic - Rate controlled - Continue Coumadin per pharmacy  5. Hypothyroidism - Continue Synthyroid  6. Generalized weakness with near syncope - PT eval pending  7. Recent history of acute esophagitis - PPI  8. Left hip pain - no fracture on xray  9. Left forearm skin tear - WOC  10 dysuria - UA negative for infection or hematuria - foley in place  All the records are reviewed and case discussed with Care Management/Social Workerr. Management plans discussed with the patient, family and they are in agreement.  CODE STATUS: full  TOTAL TIME TAKING CARE OF THIS PATIENT: 20 minutes.  Greater than 50% of time spent in care coordination and counseling. POSSIBLE D/C IN 2-3 DAYS, DEPENDING ON CLINICAL CONDITION.   Elby Showers M.D on 03/15/2015 at 2:10 PM  Between 7am  to 6pm - Pager - [phone]  After 6pm go to [email or web address] - password [email or web address] [facility]  [facility]  Office  [phone]  CC: Primary care physician; Sula Rumple, MD

## 2015-03-16 LAB — PROTIME-INR
INR: 2.84
Prothrombin Time: 29.9 seconds — ABNORMAL HIGH (ref 11.4–15.0)

## 2015-03-16 LAB — BASIC METABOLIC PANEL
ANION GAP: 7 (ref 5–15)
BUN: 82 mg/dL — ABNORMAL HIGH (ref 6–20)
CHLORIDE: 100 mmol/L — AB (ref 101–111)
CO2: 21 mmol/L — AB (ref 22–32)
Calcium: 8.9 mg/dL (ref 8.9–10.3)
Creatinine, Ser: 3.02 mg/dL — ABNORMAL HIGH (ref 0.61–1.24)
GFR calc non Af Amer: 18 mL/min — ABNORMAL LOW (ref >60)
GFR, EST AFRICAN AMERICAN: 21 mL/min — AB (ref >60)
Glucose, Bld: 102 mg/dL — ABNORMAL HIGH (ref 65–99)
POTASSIUM: 3.9 mmol/L (ref 3.5–5.1)
Sodium: 128 mmol/L — ABNORMAL LOW (ref 135–145)

## 2015-03-16 LAB — URINE CULTURE: Culture: NO GROWTH

## 2015-03-16 LAB — CBC
HEMATOCRIT: 33.9 % — AB (ref 40.0–52.0)
HEMOGLOBIN: 11.3 g/dL — AB (ref 13.0–18.0)
MCH: 34 pg (ref 26.0–34.0)
MCHC: 33.2 g/dL (ref 32.0–36.0)
MCV: 102.4 fL — ABNORMAL HIGH (ref 80.0–100.0)
Platelets: 134 10*3/uL — ABNORMAL LOW (ref 150–440)
RBC: 3.31 MIL/uL — AB (ref 4.40–5.90)
RDW: 16.1 % — ABNORMAL HIGH (ref 11.5–14.5)
WBC: 10.8 10*3/uL — AB (ref 3.8–10.6)

## 2015-03-16 NOTE — Progress Notes (Signed)
Subjective:   Patient more awake this morning. Laying in bed.  Creatinine 3.02 (3.29) UOP 500  Na 128.   Objective:  Vital signs in last 24 hours:  Temp:  [98.2 F (36.8 C)-99.5 F (37.5 C)] 98.9 F (37.2 C) (11/08 0359) Pulse Rate:  [63-67] 64 (11/08 0359) Resp:  [2-18] 17 (11/08 0359) BP: (100-127)/(45-61) 108/57 mmHg (11/08 0359) SpO2:  [97 %-99 %] 97 % (11/08 0359)  Weight change:  Filed Weights   03/13/15 1612  Weight: 102.513 kg (226 lb)    Intake/Output:    Intake/Output Summary (Last 24 hours) at 03/16/15 1017 Last data filed at 03/16/15 0700  Gross per 24 hour  Intake 1253.33 ml  Output    500 ml  Net 753.33 ml     Physical Exam: General: NAD  HEENT Anicteric, dry oral mucosal membranes  Neck supple  Pulm/lungs Normal effort, clear ant and laterally  CVS/Heart No rub or gallop  Abdomen:  Soft, distended  Extremities: + dependent edema  Neurologic: Lethargic, answering questions  Skin: Left olecrenon laceration, in dressings   GU Foley catheter       Basic Metabolic Panel:   Recent Labs Lab 03/13/15 1658 03/14/15 0339 03/16/15 0523  NA 135 136 128*  K 4.5 4.7 3.9  CL 103 102 100*  CO2 23 22 21*  GLUCOSE 107* 115* 102*  BUN 70* 68* 82*  CREATININE 3.33* 3.29* 3.02*  CALCIUM 9.7 9.9 8.9     CBC:  Recent Labs Lab 03/13/15 1658 03/14/15 0339 03/16/15 0523  WBC 9.5 13.4* 10.8*  HGB 12.0* 12.2* 11.3*  HCT 36.1* 36.6* 33.9*  MCV 101.6* 101.9* 102.4*  PLT 137* 129* 134*      Microbiology:  Recent Results (from the past 720 hour(s))  C difficile quick scan w PCR reflex     Status: Abnormal   Collection Time: 02/19/15  6:49 PM  Result Value Ref Range Status   C Diff antigen POSITIVE (A) NEGATIVE Final   C Diff toxin POSITIVE (A) NEGATIVE Final   C Diff interpretation   Final    Positive for toxigenic C. difficile, active toxin production present.    Comment: CRITICAL RESULT CALLED TO, READ BACK BY AND VERIFIED  WITH: [facility] ERDESKY AT 2054 02/19/15.PMH   C difficile quick scan w PCR reflex     Status: Abnormal   Collection Time: 03/13/15  4:58 PM  Result Value Ref Range Status   C Diff antigen POSITIVE (A) NEGATIVE Final   C Diff toxin POSITIVE (A) NEGATIVE Final   C Diff interpretation   Final    Positive for toxigenic C. difficile, active toxin production present.    Comment: CRITICAL RESULT CALLED TO, READ BACK BY AND VERIFIED WITH: [facility] MOFFITT RN AT 1830 03/13/2015   Urine culture     Status: None (Preliminary result)   Collection Time: 03/14/15  6:35 PM  Result Value Ref Range Status   Specimen Description URINE, RANDOM  Final   Special Requests NONE  Final   Culture NO GROWTH < 24 HOURS  Final   Report Status PENDING  Incomplete  Stool culture     Status: None (Preliminary result)   Collection Time: 03/15/15  1:49 AM  Result Value Ref Range Status   Specimen Description STOOL  Final   Special Requests NONE  Final   Culture   Final    NO CAMPYLOBACTER DETECTED HOLDING FOR POSSIBLE PATHOGEN    Report Status PENDING  Incomplete  Coagulation Studies:  Recent Labs  03/13/15 1658 03/14/15 0339 03/15/15 0459 03/16/15 0523  LABPROT 20.0* 23.1* 27.9* 29.9*  INR 1.68 2.03 2.59 2.84    Urinalysis:  Recent Labs  03/13/15 1658 03/14/15 1835  COLORURINE YELLOW* YELLOW*  LABSPEC 1.009 1.013  PHURINE 5.0 5.0  GLUCOSEU NEGATIVE NEGATIVE  HGBUR NEGATIVE NEGATIVE  BILIRUBINUR NEGATIVE NEGATIVE  KETONESUR NEGATIVE NEGATIVE  PROTEINUR NEGATIVE NEGATIVE  NITRITE NEGATIVE NEGATIVE  LEUKOCYTESUR NEGATIVE NEGATIVE      Imaging: Dg Hip Unilat With Pelvis 2-3 Views Left  03/15/2015  CLINICAL DATA:  Patient fell from standing 3 days ago onto his left side. Complaining of left hip pain. Unable to rotate leg. EXAM: DG HIP (WITH OR WITHOUT PELVIS) 2-3V LEFT COMPARISON:  None. FINDINGS: No fracture or bone lesion. Hip joints, SI joints and symphysis pubis are normally aligned. No  significant hip joint arthropathic change. Bones are demineralized. There are iliac and femoral vascular calcifications. IMPRESSION: No fracture or dislocation. Electronically Signed   By: Amie Portlandavid  Ormond M.D.   On: 03/15/2015 12:56     Medications:   . sodium chloride 50 mL/hr at 03/15/15 1332   . amiodarone  200 mg Oral Daily  . aspirin EC  81 mg Oral Daily  . atorvastatin  20 mg Oral QHS  . cholecalciferol  1,000 Units Oral Daily  . famotidine  20 mg Oral Daily  . levothyroxine  150 mcg Oral QAC breakfast  . midodrine  5 mg Oral TID WC  . saccharomyces boulardii  250 mg Oral BID  . vancomycin  500 mg Oral 4 times per day  . warfarin  3 mg Oral q1800  . Warfarin - Pharmacist Dosing Inpatient   Does not apply q1800   acetaminophen **OR** acetaminophen, oxyCODONE  Assessment/ Plan:  81 y.o. white male with congestive heart failure, atrial fibrillation, chronic anticoagulation, hypothyroidism, BPH, hyperlipidemia, hypertension, coronary artery disease status post CABG,Liver cirrhosis who [facility] for C. Diff colitis.   1. Acute kidney failure on chronic kidney disease stage IV. Baseline 2.78/GFR 20. [facility] as an [facility].  Creatinine improving with IV fluids, however with increasing peripheral edema and lowering sodium levels.  - hold IV fluids and diuretics currently.   2. Hyponatremia: hypervolemic hypo-osmotic hyponatremia. Consistent with volume overload from hepatic cirrhosis and congestive heart failure systolic with EF of 30-35% (echo 01/17/15) - Holding normal saline.  - will need to eventually restart furosemide. Continue to hold metolazone.   2. Edema: with history of hepatic cirrhosis.  - holding diuretics. Home regimen of spironolactone, metolazone and furosemide.   3. Hypotension: blood pressure at goal - continue midodrine.   4. Infective Colitis: C. Diff A09 - on metronidazole and vancomycin.  - probiotic prescribed.  - Appreciate  GI input.     LOS: 3 Taneya Conkel 11/8/201610:17 AM

## 2015-03-16 NOTE — Evaluation (Signed)
Physical Therapy Evaluation Patient Details Name: Blake White MRN: [id number] DOB: 01/22/1934 Today's Date: 03/16/2015   History of Present Illness  Pt is a 81 y.o. male presenting with generalized weakness, ongoing diarrhea, and fall.  Pt admitted with AMS with recurrent C. difficile colitis.  Pt with L hip pain (imaging negative for fx); CT head negative.  Pt with recent hospital stay September 2016 (d/t AKI and c-diff).  PMH includes chronic a-fib on Coumadin, CHF, recurrent c-diff, CVA.  Clinical Impression  Currently pt demonstrates impairments with strength, balance, activity tolerance, and limitations with functional mobility.  Prior to admission, pt was modified independent with functional mobility (switched between no AD, cane, or walker for ambulation depending on his needs).  Pt lives with his wife in 1 level home with 2 steps to enter with L railing.  Currently pt is max to total assist supine to/from sit (pt fatigued quickly and unable to assist anymore); 2 assist to scoot pt up in bed.  Pt would benefit from skilled PT to address above noted impairments and functional limitations.  Recommend pt discharge to STR when medically appropriate (pt and pt's wife in agreement with this).     Follow Up Recommendations SNF    Equipment Recommendations       Recommendations for Other Services       Precautions / Restrictions Precautions Precautions: Fall Restrictions Weight Bearing Restrictions: No      Mobility  Bed Mobility Overal bed mobility: Needs Assistance Bed Mobility: Supine to Sit;Sit to Supine     Supine to sit: Max assist;Total assist;HOB elevated Sit to supine: Max assist;Total assist;HOB elevated   General bed mobility comments: pt able to scoot to almost edge of bed (with assist for trunk and B LE's) but fatigued and unable to assist anymore so pt assisted back into supine and required 2 assist to boost up in bed  Transfers                 General  transfer comment: not safe at this time d/t significant assist levels and pt fatigue  Ambulation/Gait             General Gait Details: not safe at this time d/t significant assist levels and pt fatigue  Stairs            Wheelchair Mobility    Modified Rankin (Stroke Patients Only)       Balance Overall balance assessment: Needs assistance Sitting-balance support: Bilateral upper extremity supported;Feet unsupported Sitting balance-Leahy Scale: Poor Sitting balance - Comments: posterior lean                                     Pertinent Vitals/Pain Pain Assessment: 0-10 Pain Score: 4  Pain Location: L hip Pain Descriptors / Indicators: Sore;Constant;Tender Pain Intervention(s): Limited activity within patient's tolerance;Monitored during session;Repositioned  Vitals stable and WFL throughout treatment session.    Home Living Family/patient expects to be discharged to:: Skilled nursing facility Living Arrangements: Spouse/significant other Available Help at Discharge: Family Type of Home: House Home Access: Stairs to enter Entrance Stairs-Rails: Left Entrance Stairs-Number of Steps: 2 Home Layout: One level Home Equipment: Walker - 2 wheels;Cane - single point;Shower seat;Bedside commode      Prior Function Level of Independence: Independent with assistive device(s)         Comments: pt alternates between no AD, cane, and RW as needed for  stability.     Hand Dominance        Extremity/Trunk Assessment   Upper Extremity Assessment: Generalized weakness           Lower Extremity Assessment: Generalized weakness         Communication   Communication: No difficulties  Cognition Arousal/Alertness:  (pt appearing tired intermittently) Behavior During Therapy: WFL for tasks assessed/performed Overall Cognitive Status:  (pt appeared oriented but confused at times)                      General Comments General  comments (skin integrity, edema, etc.): B Unna boots in place    Exercises        Assessment/Plan    PT Assessment Patient needs continued PT services  PT Diagnosis Difficulty walking;Generalized weakness;Acute pain   PT Problem List Decreased strength;Decreased activity tolerance;Decreased balance;Decreased mobility;Pain  PT Treatment Interventions DME instruction;Gait training;Stair training;Functional mobility training;Therapeutic activities;Therapeutic exercise;Balance training;Neuromuscular re-education;Patient/family education;Manual techniques   PT Goals (Current goals can be found in the Care Plan section) Acute Rehab PT Goals Patient Stated Goal: to get stronger PT Goal Formulation: With patient/family Time For Goal Achievement: 03/30/15 Potential to Achieve Goals: Good    Frequency Min 2X/week   Barriers to discharge Decreased caregiver support      Co-evaluation               End of Session   Activity Tolerance: Patient limited by fatigue;Patient limited by pain Patient left: in bed;with call bell/phone within reach;with bed alarm set;with family/visitor present           Time: 4098-1191 PT Time Calculation (min) (ACUTE ONLY): 20 min   Charges:   PT Evaluation $Initial PT Evaluation Tier I: 1 Procedure     PT G CodesHendricks Limes 03/24/2015, 4:53 PM Hendricks Limes, PT [phone]

## 2015-03-16 NOTE — Care Management (Signed)
Spoke with patient and  Spouse for discharge planning, patient is from home and has a walker. Spouse stated that he does drive but has not been able to since recurrence of CDiff.  Able to afford meds and Dr Markham JordanElliot is following  For possible stool transplant. Has been open to [facility] prior.

## 2015-03-16 NOTE — Progress Notes (Signed)
[facility] [place] at [facility]   PATIENT NAME: Blake White    MR#:  [id number]  DATE OF BIRTH:  [birth year]  SUBJECTIVE:  CHIEF COMPLAINT:   Chief Complaint  Patient presents with  . Fall  . Weakness  . Diarrhea   Resting comfortably. Stools have slowed down. Less pain in the left hip today.  REVIEW OF SYSTEMS:   Review of Systems  Constitutional: Positive for malaise/fatigue. Negative for fever.  Respiratory: Negative for shortness of breath.   Cardiovascular: Negative for chest pain and palpitations.  Gastrointestinal: Positive for abdominal pain and diarrhea. Negative for nausea, vomiting and blood in stool.  Genitourinary: Negative for dysuria.  Neurological: Positive for weakness.    DRUG ALLERGIES:  No Known Allergies  VITALS:  Blood pressure 112/52, pulse 61, temperature 98.4 F (36.9 C), temperature source Oral, resp. rate 18, height  (1.727 m), weight 102.513 kg (226 lb), SpO2 98 %.  PHYSICAL EXAMINATION:  GENERAL:  81 y.o.-year-old patient lying in the bed, sleeping comfortably LUNGS: Normal breath sounds bilaterally, no wheezing, rales,rhonchi or crepitation. No use of accessory muscles of respiration.  CARDIOVASCULAR: S1, S2 normal. No murmurs, rubs, or gallops.  ABDOMEN: Soft, nontender, distended, tympanic. Bowel sounds present. No organomegaly or mass.  EXTREMITIES: Trace bilateral pedal edema, cyanosis, or clubbing.  NEUROLOGIC: Cranial nerves II through XII are intact. Muscle strength 5/5 in all extremities. Sensation intact. Gait not checked.  PSYCHIATRIC: The patient is sleeping deeply today  SKIN: Large skin tear over left forearm   LABORATORY PANEL:   CBC  Recent Labs Lab 03/16/15 0523  WBC 10.8*  HGB 11.3*  HCT 33.9*  PLT 134*   ------------------------------------------------------------------------------------------------------------------  Chemistries   Recent Labs Lab 03/14/15 0339  03/16/15 0523  NA 136 128*  K 4.7 3.9  CL 102 100*  CO2 22 21*  GLUCOSE 115* 102*  BUN 68* 82*  CREATININE 3.29* 3.02*  CALCIUM 9.9 8.9  AST 71*  --   ALT 45  --   ALKPHOS 76  --   BILITOT 2.0*  --    ------------------------------------------------------------------------------------------------------------------  Cardiac Enzymes  Recent Labs Lab 03/13/15 1658  TROPONINI 0.04*   ------------------------------------------------------------------------------------------------------------------  RADIOLOGY:  Dg Hip Unilat With Pelvis 2-3 Views Left  03/15/2015  CLINICAL DATA:  Patient fell from standing 3 days ago onto his left side. Complaining of left hip pain. Unable to rotate leg. EXAM: DG HIP (WITH OR WITHOUT PELVIS) 2-3V LEFT COMPARISON:  None. FINDINGS: No fracture or bone lesion. Hip joints, SI joints and symphysis pubis are normally aligned. No significant hip joint arthropathic change. Bones are demineralized. There are iliac and femoral vascular calcifications. IMPRESSION: No fracture or dislocation. Electronically Signed   By: Amie Portland M.D.   On: 03/15/2015 12:56    EKG:   Orders placed or performed during the hospital encounter of 03/13/15  . ED EKG  . ED EKG  . EKG 12-Lead  . EKG 12-Lead    ASSESSMENT AND PLAN:   Taisei Bonnette is a 81 y.o. male with a known history of chronic atrial fibrillation, on Coumadin, congestive heart failure, hypothyroidism, chronic liver cirrhosis and recurrent C. difficile colitis was just admitted to the hospital and treated with by mouth vancomycin for C. difficile colitis. Patient finished his antibiotic course just 1 week ago but still having ongoing diarrhea. For the past 2 days his diarrhea has been worse and copious. Patient was feeling weak and lightheaded and sustained a fall but  did not pass out. According to family members patient is slightly confused and not being himself  1) recurrent Clostridium difficile  colitis - Appreciate GI consultation and consideration for fecal transplant - on high dose oral vanc - We'll need to complete 2 weeks of vancomycin prior to fecal transplant  #2 acute on chronic kidney disease stage III - Due to ATN volume depletion, improving - Continue to avoid nephrotoxins, gentle hydration - appreciate nephrology following  #3 congestive heart failure with mild exacerbation -  hold spironolactone and Lasix, continue amiodarone - Continue daily weights I's and O's  #4 atrial fibrillation, chronic - Rate controlled - Continue Coumadin per pharmacy  5. Hypothyroidism - Continue Synthyroid  6. Generalized weakness with near syncope - PT eval pending  7. Recent history of acute esophagitis - PPI  8. Left hip pain - no fracture on xray  9. Left forearm skin tear - WOC  10 dysuria - UA negative for infection or hematuria - foley in place  All the records are reviewed and case discussed with Care Management/Social Workerr. Management plans discussed with the patient, family and they are in agreement.  CODE STATUS: full  TOTAL TIME TAKING CARE OF THIS PATIENT: 20 minutes.  Greater than 50% of time spent in care coordination and counseling. POSSIBLE D/C IN 2-3 DAYS, DEPENDING ON CLINICAL CONDITION.   Elby ShowersWALSH, CATHERINE M.D on 03/16/2015 at 4:49 PM  Between 7am to 6pm - Pager - [phone]  After 6pm go to [email or web address] - password [email or web address] [facility]  [place] [facility]  Office  [phone]  CC: Primary care physician; [name], Charanjit, MD

## 2015-03-16 NOTE — Consult Note (Signed)
ANTICOAGULATION CONSULT NOTE - Follow Up Consult  Pharmacy Consult for warfarin Indication: VTE prophylaxis  No Known Allergies  Patient Measurements: Height: 5\' 8"  (172.7 cm) Weight: 226 lb (102.513 kg) IBW/kg (Calculated) : 68.4  Vital Signs: Temp: 98.9 F (37.2 C) (11/08 0359) Temp Source: Oral (11/08 0359) BP: 108/57 mmHg (11/08 0359) Pulse Rate: 64 (11/08 0359)  Labs:  Recent Labs  03/13/15 1658 03/14/15 0339 03/15/15 0459 03/16/15 0523  HGB 12.0* 12.2*  --  11.3*  HCT 36.1* 36.6*  --  33.9*  PLT 137* 129*  --  134*  LABPROT 20.0* 23.1* 27.9* 29.9*  INR 1.68 2.03 2.59 2.84  CREATININE 3.33* 3.29*  --  3.02*  TROPONINI 0.04*  --   --   --     Estimated Creatinine Clearance: 23 mL/min (by C-G formula based on Cr of 3.02).   Medications:  Scheduled:  . amiodarone  200 mg Oral Daily  . aspirin EC  81 mg Oral Daily  . atorvastatin  20 mg Oral QHS  . cholecalciferol  1,000 Units Oral Daily  . famotidine  20 mg Oral Daily  . levothyroxine  150 mcg Oral QAC breakfast  . midodrine  5 mg Oral TID WC  . saccharomyces boulardii  250 mg Oral BID  . vancomycin  500 mg Oral 4 times per day  . warfarin  3 mg Oral q1800  . Warfarin - Pharmacist Dosing Inpatient   Does not apply q1800    Assessment: Pt is a 81 year old male who presents to the ED after a fall. Pt has had several days of diarrhea, following cdiff tx a few weeks ago. Pts scans are negative for a bleed due to his fall. INR on arrival is subtherapeutic at 1.68. Pt home dose is 3mg  daily. Pt is on warfarin for Afib.  Goal of Therapy:  INR 2-3 Monitor platelets by anticoagulation protocol: Yes   Plan:  Since INR is subtherapeutic will give 4mg  tonight. Pts current illness/potentially being restarted on antibiotics both have potential to raise INR. Will monitor closely. Will recheck INR in the AM.  11/6 INR 2.03. Restart home dose of 3 mg daily. INR in AM.  11/7 INR 2.59. Continue 3 mg daily. Daily INR  ordered while pt is on antibiotics.  11/8 INR 2.84. Continue 3mg  daily. Monitor INR daily.   Cy Blamerllison K Alphonsine Minium 03/16/2015,9:09 AM

## 2015-03-16 NOTE — Care Management Important Message (Signed)
Important Message  Patient Details  Name: Archer AsaBobby Y Boccio MRN: [id number] Date of Birth: 09/08/1933   Medicare Important Message Given:  Yes-second notification given    [name], Paeton Studer L, RN 03/16/2015, 11:42 AM

## 2015-03-16 NOTE — Consult Note (Signed)
Pt feeling better, no diarrhea today.  He may do best if has a colonoscopy with FMT to prevent recurrence.  It would be best if he could go to skilled nursing or other temporary health care facility so that he could have assistance for his prep when he gets a colon.  His wife is not able to get him in and out of bed her self and he needs several more days until he is well enough to drink the prep for a colon.

## 2015-03-17 LAB — CBC
HEMATOCRIT: 35.2 % — AB (ref 40.0–52.0)
HEMOGLOBIN: 11.5 g/dL — AB (ref 13.0–18.0)
MCH: 33.4 pg (ref 26.0–34.0)
MCHC: 32.6 g/dL (ref 32.0–36.0)
MCV: 102.5 fL — ABNORMAL HIGH (ref 80.0–100.0)
Platelets: 146 10*3/uL — ABNORMAL LOW (ref 150–440)
RBC: 3.44 MIL/uL — ABNORMAL LOW (ref 4.40–5.90)
RDW: 15.9 % — ABNORMAL HIGH (ref 11.5–14.5)
WBC: 8.8 10*3/uL (ref 3.8–10.6)

## 2015-03-17 LAB — COMPREHENSIVE METABOLIC PANEL
ALBUMIN: 2.2 g/dL — AB (ref 3.5–5.0)
ALK PHOS: 70 U/L (ref 38–126)
ALT: 27 U/L (ref 17–63)
ANION GAP: 5 (ref 5–15)
AST: 38 U/L (ref 15–41)
BILIRUBIN TOTAL: 0.5 mg/dL (ref 0.3–1.2)
BUN: 77 mg/dL — AB (ref 6–20)
CALCIUM: 9.1 mg/dL (ref 8.9–10.3)
CO2: 23 mmol/L (ref 22–32)
CREATININE: 2.89 mg/dL — AB (ref 0.61–1.24)
Chloride: 103 mmol/L (ref 101–111)
GFR calc Af Amer: 22 mL/min — ABNORMAL LOW (ref >60)
GFR calc non Af Amer: 19 mL/min — ABNORMAL LOW (ref >60)
GLUCOSE: 102 mg/dL — AB (ref 65–99)
Potassium: 4.1 mmol/L (ref 3.5–5.1)
SODIUM: 131 mmol/L — AB (ref 135–145)
TOTAL PROTEIN: 5.3 g/dL — AB (ref 6.5–8.1)

## 2015-03-17 LAB — PROTIME-INR
INR: 2.91
Prothrombin Time: 30.5 seconds — ABNORMAL HIGH (ref 11.4–15.0)

## 2015-03-17 NOTE — Consult Note (Signed)
Pt with distended abdomen, bowel sounds present, minimal tenderness.  Creatinine 2.89, alb 2.2, WBC 8.8, Hgb 11.5, INR 2.9.  Discussed plan with wife for stool transplant after course of vancomycin for 2 weeks.  This can be done in a skilled nursing facility.

## 2015-03-17 NOTE — [place] FL2 (Signed)
[place] MEDICAID FL2 LEVEL OF CARE SCREENING TOOL     IDENTIFICATION  Patient Name: Blake White Birthdate: March 31, 1934 Sex: male Admission Date (Current Location): 03/13/2015  [place] and [place] Number: Chiropodist and Address:  [facility], [address] [place], [place] [address]      Provider Number: [id number]  Attending Physician Name and Address:  Gale Journey, MD  Relative Name and Phone Number:       Current Level of Care: Hospital Recommended Level of Care: Skilled Nursing Facility Prior Approval Number:    Date Approved/Denied:   PASRR Number: [id number]  Discharge Plan: SNF    Current Diagnoses: Patient Active Problem List   Diagnosis Date Noted  . Diarrhea of presumed infectious origin   . C. difficile colitis   . Recurrent colitis due to Clostridium difficile 03/13/2015  . Acute kidney injury (HCC) 01/16/2015  . Abdominal pain 01/10/2015  . Diarrhea 01/10/2015    Orientation ACTIVITIES/SOCIAL BLADDER RESPIRATION    Self, Time, Situation, Place  Active Continent Normal  BEHAVIORAL SYMPTOMS/MOOD NEUROLOGICAL BOWEL NUTRITION STATUS   (none)  (none) Incontinent Diet  PHYSICIAN VISITS COMMUNICATION OF NEEDS Height & Weight Skin  30 days Verbally   226 lbs. Normal          AMBULATORY STATUS RESPIRATION    Assist extensive Normal      Personal Care Assistance Level of Assistance  Bathing, Dressing Bathing Assistance: Limited assistance   Dressing Assistance: Limited assistance      Functional Limitations Info   (none)             SPECIAL CARE FACTORS FREQUENCY  PT (By licensed PT)                   Additional Factors Info  Code Status, Allergies, Isolation Precautions (Cdiff) Code Status Info: Full Allergies Info: NKA           Current Medications (03/17/2015): Current Facility-Administered Medications  Medication Dose Route Frequency Provider Last Rate Last Dose  .  acetaminophen (TYLENOL) tablet 650 mg  650 mg Oral Q6H PRN Ramonita Lab, MD       Or  . acetaminophen (TYLENOL) suppository 650 mg  650 mg Rectal Q6H PRN Ramonita Lab, MD      . amiodarone (PACERONE) tablet 200 mg  200 mg Oral Daily Ramonita Lab, MD   200 mg at 03/17/15 1123  . aspirin EC tablet 81 mg  81 mg Oral Daily Ramonita Lab, MD   81 mg at 03/17/15 1123  . atorvastatin (LIPITOR) tablet 20 mg  20 mg Oral QHS Ramonita Lab, MD   20 mg at 03/16/15 2220  . cholecalciferol (VITAMIN D) tablet 1,000 Units  1,000 Units Oral Daily Ramonita Lab, MD   1,000 Units at 03/17/15 1124  . famotidine (PEPCID) tablet 20 mg  20 mg Oral Daily Ramonita Lab, MD   20 mg at 03/17/15 1124  . levothyroxine (SYNTHROID, LEVOTHROID) tablet 150 mcg  150 mcg Oral QAC breakfast Ramonita Lab, MD   150 mcg at 03/17/15 1123  . midodrine (PROAMATINE) tablet 5 mg  5 mg Oral TID WC Ramonita Lab, MD   5 mg at 03/17/15 1124  . oxyCODONE (Oxy IR/ROXICODONE) immediate release tablet 5 mg  5 mg Oral Q4H PRN Ramonita Lab, MD   5 mg at 03/15/15 1144  . saccharomyces boulardii (FLORASTOR) capsule 250 mg  250 mg Oral BID Ramonita Lab, MD   250 mg at  03/17/15 1124  . vancomycin (VANCOCIN) 50 mg/mL oral solution 500 mg  500 mg Oral 4 times per day Gale Journeyatherine P Walsh, MD   500 mg at 03/17/15 1237  . warfarin (COUMADIN) tablet 3 mg  3 mg Oral q1800 Ramonita LabAruna Gouru, MD   3 mg at 03/16/15 1732  . Warfarin - Pharmacist Dosing Inpatient   Does not apply q1800 Olene FlossMelissa D Maccia, RPH       Do not use this list as official medication orders. Please verify with discharge summary.  Discharge Medications:   Medication List    ASK your doctor about these medications        allopurinol 300 MG tablet  Commonly known as:  ZYLOPRIM  Take 150 mg by mouth daily.     amiodarone 200 MG tablet  Commonly known as:  PACERONE  Take 200 mg by mouth daily.     aspirin EC 81 MG tablet  Take 81 mg by mouth daily.     furosemide 40 MG tablet  Commonly known as:  LASIX   Take 1 tablet (40 mg total) by mouth 2 (two) times daily.     furosemide 40 MG tablet  Commonly known as:  LASIX  Take 40 mg by mouth daily.     levothyroxine 150 MCG tablet  Commonly known as:  SYNTHROID, LEVOTHROID  Take 150 mcg by mouth daily before breakfast.     metolazone 5 MG tablet  Commonly known as:  ZAROXOLYN  Take 5 mg by mouth daily.     midodrine 5 MG tablet  Commonly known as:  PROAMATINE  Take 1 tablet (5 mg total) by mouth 3 (three) times daily with meals.     saccharomyces boulardii 250 MG capsule  Commonly known as:  FLORASTOR  Take 1 capsule (250 mg total) by mouth 2 (two) times daily.     simvastatin 40 MG tablet  Commonly known as:  ZOCOR  Take 40 mg by mouth at bedtime.     spironolactone 100 MG tablet  Commonly known as:  ALDACTONE  Take 1 tablet (100 mg total) by mouth daily.     vancomycin 125 MG capsule  Commonly known as:  VANCOCIN  Take 1 capsule (125 mg total) by mouth 4 (four) times daily.     vancomycin 50 mg/mL oral solution  Commonly known as:  VANCOCIN  Take 2.5 mLs (125 mg total) by mouth every 6 (six) hours.     Vitamin D3 1000 UNITS Caps  Take 1,000 Units by mouth daily.     warfarin 3 MG tablet  Commonly known as:  COUMADIN  Take 1 tablet (3 mg total) by mouth every evening.        Relevant Imaging Results:  Relevant Lab Results:  Recent Labs    Additional Information SS: [id number]  York SpanielMonica Marra, LCSW

## 2015-03-17 NOTE — Progress Notes (Signed)
Subjective:   Wife at bedside. Off IV fluids Creatinine 2.89 (3.02) (3.29) UOP 1150 (500)  Na 131 (128)   Objective:  Vital signs in last 24 hours:  Temp:  [97.5 F (36.4 C)-97.9 F (36.6 C)] 97.5 F (36.4 C) (11/09 1326) Pulse Rate:  [56-57] 56 (11/09 1326) Resp:  [16-18] 16 (11/09 1326) BP: (98-108)/(51-55) 108/53 mmHg (11/09 1326) SpO2:  [98 %-100 %] 100 % (11/09 1326) Weight:  [102.604 kg (226 lb 3.2 oz)] 102.604 kg (226 lb 3.2 oz) (11/09 0607)  Weight change:  Filed Weights   03/13/15 1612 03/17/15 0607  Weight: 102.513 kg (226 lb) 102.604 kg (226 lb 3.2 oz)    Intake/Output:    Intake/Output Summary (Last 24 hours) at 03/17/15 1508 Last data filed at 03/17/15 1348  Gross per 24 hour  Intake    100 ml  Output   1500 ml  Net  -1400 ml     Physical Exam: General: NAD  HEENT Anicteric, dry oral mucosal membranes  Neck supple  Pulm/lungs Normal effort, clear ant and laterally  CVS/Heart No rub or gallop  Abdomen:  Soft, distended  Extremities: + dependent edema  Neurologic: Lethargic, answering questions  Skin: Left olecrenon laceration, in dressings   GU Foley catheter       Basic Metabolic Panel:   Recent Labs Lab 03/13/15 1658 03/14/15 0339 03/16/15 0523 03/17/15 0524  NA 135 136 128* 131*  K 4.5 4.7 3.9 4.1  CL 103 102 100* 103  CO2 23 22 21* 23  GLUCOSE 107* 115* 102* 102*  BUN 70* 68* 82* 77*  CREATININE 3.33* 3.29* 3.02* 2.89*  CALCIUM 9.7 9.9 8.9 9.1     CBC:  Recent Labs Lab 03/13/15 1658 03/14/15 0339 03/16/15 0523 03/17/15 0524  WBC 9.5 13.4* 10.8* 8.8  HGB 12.0* 12.2* 11.3* 11.5*  HCT 36.1* 36.6* 33.9* 35.2*  MCV 101.6* 101.9* 102.4* 102.5*  PLT 137* 129* 134* 146*      Microbiology:  Recent Results (from the past 720 hour(s))  C difficile quick scan w PCR reflex     Status: Abnormal   Collection Time: 02/19/15  6:49 PM  Result Value Ref Range Status   C Diff antigen POSITIVE (A) NEGATIVE Final   C Diff  toxin POSITIVE (A) NEGATIVE Final   C Diff interpretation   Final    Positive for toxigenic C. difficile, active toxin production present.    Comment: CRITICAL RESULT CALLED TO, READ BACK BY AND VERIFIED WITH: [facility] ERDESKY AT 2054 02/19/15.PMH   C difficile quick scan w PCR reflex     Status: Abnormal   Collection Time: 03/13/15  4:58 PM  Result Value Ref Range Status   C Diff antigen POSITIVE (A) NEGATIVE Final   C Diff toxin POSITIVE (A) NEGATIVE Final   C Diff interpretation   Final    Positive for toxigenic C. difficile, active toxin production present.    Comment: CRITICAL RESULT CALLED TO, READ BACK BY AND VERIFIED WITH: [facility] MOFFITT RN AT 1830 03/13/2015   Urine culture     Status: None   Collection Time: 03/14/15  6:35 PM  Result Value Ref Range Status   Specimen Description URINE, RANDOM  Final   Special Requests NONE  Final   Culture NO GROWTH 1 DAY  Final   Report Status 03/16/2015 FINAL  Final  Stool culture     Status: None (Preliminary result)   Collection Time: 03/15/15  1:49 AM  Result Value Ref  Range Status   Specimen Description STOOL  Final   Special Requests NONE  Final   Culture   Final    NO CAMPYLOBACTER DETECTED HOLDING FOR POSSIBLE PATHOGEN No Pathogenic E. coli detected    Report Status PENDING  Incomplete    Coagulation Studies:  Recent Labs  03/15/15 0459 03/16/15 0523 03/17/15 0524  LABPROT 27.9* 29.9* 30.5*  INR 2.59 2.84 2.91    Urinalysis:  Recent Labs  03/14/15 1835  COLORURINE YELLOW*  LABSPEC 1.013  PHURINE 5.0  GLUCOSEU NEGATIVE  HGBUR NEGATIVE  BILIRUBINUR NEGATIVE  KETONESUR NEGATIVE  PROTEINUR NEGATIVE  NITRITE NEGATIVE  LEUKOCYTESUR NEGATIVE      Imaging: No results found.   Medications:     . amiodarone  200 mg Oral Daily  . aspirin EC  81 mg Oral Daily  . atorvastatin  20 mg Oral QHS  . cholecalciferol  1,000 Units Oral Daily  . famotidine  20 mg Oral Daily  . levothyroxine  150 mcg Oral QAC  breakfast  . midodrine  5 mg Oral TID WC  . saccharomyces boulardii  250 mg Oral BID  . vancomycin  500 mg Oral 4 times per day  . warfarin  3 mg Oral q1800  . Warfarin - Pharmacist Dosing Inpatient   Does not apply q1800   acetaminophen **OR** acetaminophen, oxyCODONE  Assessment/ Plan:  81 y.o. white male with congestive heart failure, atrial fibrillation, chronic anticoagulation, hypothyroidism, BPH, hyperlipidemia, hypertension, coronary artery disease status post CABG,Liver cirrhosis who was [facility] to [facility] for C. Diff colitis.   1. Acute kidney failure on chronic kidney disease stage IV. Baseline 2.78/GFR 20. [facility] as an [facility].  Creatinine improving with oral and IV intake, increasing peripheral edema and lowering sodium levels so agree with hodling IV fluids and diuretics.   2. Hyponatremia: hypervolemic hypo-osmotic hyponatremia. Consistent with volume overload from hepatic cirrhosis and congestive heart failure systolic with EF of 30-35% (echo 01/17/15) - Holding normal saline.  - will need to eventually restart furosemide. Continue to hold metolazone.   2. Edema: with history of hepatic cirrhosis.  - holding diuretics. Home regimen of spironolactone, metolazone and furosemide.   3. Hypotension: blood pressure at goal - continue midodrine.   4. Infective Colitis: C. Diff A09 - on metronidazole and vancomycin.  - probiotic prescribed.  - Appreciate GI input.     LOS: 4 Zyiere Rosemond 11/9/20163:08 PM

## 2015-03-17 NOTE — Progress Notes (Signed)
[facility] [place] [facility]   PATIENT NAME: Blake White    MR#:  [id number]  DATE OF BIRTH:  08/03/1933  SUBJECTIVE:  CHIEF COMPLAINT:   Chief Complaint  Patient presents with  . Fall  . Weakness  . Diarrhea   More alert this morning, left leg pain resolved, diarrhea slowing down  REVIEW OF SYSTEMS:   Review of Systems  Constitutional: Positive for malaise/fatigue. Negative for fever.  Respiratory: Negative for shortness of breath.   Cardiovascular: Negative for chest pain and palpitations.  Gastrointestinal: Positive for abdominal pain and diarrhea. Negative for nausea, vomiting and blood in stool.  Genitourinary: Negative for dysuria.  Neurological: Positive for weakness.    DRUG ALLERGIES:  No Known Allergies  VITALS:  Blood pressure 100/51, pulse 57, temperature 97.7 F (36.5 C), temperature source Oral, resp. rate 16, height 5\' 8"  (1.727 m), weight 102.604 kg (226 lb 3.2 oz), SpO2 99 %.  PHYSICAL EXAMINATION:  GENERAL:  81 y.o.-year-old patient lying in the bed, no distress LUNGS: Normal breath sounds bilaterally, no wheezing, rales,rhonchi or crepitation. No use of accessory muscles of respiration.  CARDIOVASCULAR: S1, S2 normal. No murmurs, rubs, or gallops.  ABDOMEN: Soft, nontender, distended, tympanic. Bowel sounds present. No organomegaly or mass.  EXTREMITIES: Trace bilateral pedal edema, cyanosis, or clubbing.  NEUROLOGIC: Cranial nerves II through XII are intact. Muscle strength 5/5 in all extremities. Sensation intact. Gait not checked.  PSYCHIATRIC: Alert, oriented, calm  SKIN: Large skin tear over left forearm   LABORATORY PANEL:   CBC  Recent Labs Lab 03/17/15 0524  WBC 8.8  HGB 11.5*  HCT 35.2*  PLT 146*   ------------------------------------------------------------------------------------------------------------------  Chemistries   Recent Labs Lab 03/17/15 0524  NA 131*  K 4.1  CL 103  CO2 23   GLUCOSE 102*  BUN 77*  CREATININE 2.89*  CALCIUM 9.1  AST 38  ALT 27  ALKPHOS 70  BILITOT 0.5   ------------------------------------------------------------------------------------------------------------------  Cardiac Enzymes  Recent Labs Lab 03/13/15 1658  TROPONINI 0.04*   ------------------------------------------------------------------------------------------------------------------  RADIOLOGY:  Dg Hip Unilat With Pelvis 2-3 Views Left  03/15/2015  CLINICAL DATA:  Patient fell from standing 3 days ago onto his left side. Complaining of left hip pain. Unable to rotate leg. EXAM: DG HIP (WITH OR WITHOUT PELVIS) 2-3V LEFT COMPARISON:  None. FINDINGS: No fracture or bone lesion. Hip joints, SI joints and symphysis pubis are normally aligned. No significant hip joint arthropathic change. Bones are demineralized. There are iliac and femoral vascular calcifications. IMPRESSION: No fracture or dislocation. Electronically Signed   By: Amie Portlandavid  Ormond M.D.   On: 03/15/2015 12:56    EKG:   Orders placed or performed during the hospital encounter of 03/13/15  . ED EKG  . ED EKG  . EKG 12-Lead  . EKG 12-Lead    ASSESSMENT AND PLAN:   Blake MangesBobby White is a 81 y.o. male with a known history of chronic atrial fibrillation, on Coumadin, congestive heart failure, hypothyroidism, chronic liver cirrhosis and recurrent C. difficile colitis was just admitted to the hospital and treated with by mouth vancomycin for C. difficile colitis. Patient finished his antibiotic course just 1 week ago but still having ongoing diarrhea. For the past 2 days his diarrhea has been worse and copious. Patient was feeling weak and lightheaded and sustained a fall but did not pass out. According to family members patient is slightly confused and not being himself  1) recurrent Clostridium difficile colitis - Appreciate GI  consultation and consideration for fecal transplant - on high dose oral vanc - We'll need  to complete 2 weeks of vancomycin prior to fecal transplant  #2 acute on chronic kidney disease stage III - Due to ATN volume depletion, improving - Continue to avoid nephrotoxins, gentle hydration - appreciate nephrology following  #3 congestive heart failure: Exacerbation resolved -  hold spironolactone and Lasix, continue amiodarone - Continue daily weights I's and O's  #4 atrial fibrillation, chronic - Rate controlled - Continue Coumadin per pharmacy  5. Hypothyroidism - Continue Synthyroid  6. Generalized weakness with near syncope - PT eval pending, likely SNF placement needed  7. Recent history of acute esophagitis - PPI  8. Left hip pain - no fracture on xray  9. Left forearm skin tear - WOC  10 dysuria - UA negative for infection or hematuria - foley in place  All the records are reviewed and case discussed with Care Management/Social Workerr. Management plans discussed with the patient, family and they are in agreement.  CODE STATUS: full  TOTAL TIME TAKING CARE OF THIS PATIENT: 20 minutes.  Greater than 50% of time spent in care coordination and counseling. POSSIBLE D/C IN 2-3 DAYS, DEPENDING ON CLINICAL CONDITION.   Elby Showers M.D on 03/17/2015 at 12:03 PM  Between 7am to 6pm - Pager - [phone]  After 6pm go to [email or web address] - password [email or web address]  [facility]  Office  [phone]  CC: Primary care physician; Sula Rumple, MD

## 2015-03-17 NOTE — Consult Note (Signed)
ANTICOAGULATION CONSULT NOTE - Follow Up Consult  Pharmacy Consult for warfarin Indication: VTE prophylaxis  No Known Allergies  Patient Measurements: Height: 5\' 8"  (172.7 cm) Weight: 226 lb 3.2 oz (102.604 kg) IBW/kg (Calculated) : 68.4  Vital Signs: Temp: 97.5 F (36.4 C) (11/09 1326) Temp Source: Oral (11/09 1326) BP: 108/53 mmHg (11/09 1326) Pulse Rate: 56 (11/09 1326)  Labs:  Recent Labs  03/15/15 0459 03/16/15 0523 03/17/15 0524  HGB  --  11.3* 11.5*  HCT  --  33.9* 35.2*  PLT  --  134* 146*  LABPROT 27.9* 29.9* 30.5*  INR 2.59 2.84 2.91  CREATININE  --  3.02* 2.89*    Estimated Creatinine Clearance: 24.1 mL/min (by C-G formula based on Cr of 2.89).   Medications:  Scheduled:  . amiodarone  200 mg Oral Daily  . aspirin EC  81 mg Oral Daily  . atorvastatin  20 mg Oral QHS  . cholecalciferol  1,000 Units Oral Daily  . famotidine  20 mg Oral Daily  . levothyroxine  150 mcg Oral QAC breakfast  . midodrine  5 mg Oral TID WC  . saccharomyces boulardii  250 mg Oral BID  . vancomycin  500 mg Oral 4 times per day  . warfarin  3 mg Oral q1800  . Warfarin - Pharmacist Dosing Inpatient   Does not apply q1800    Assessment: Pt is a 81 year old male who presents to the ED after a fall. Pt has had several days of diarrhea, following cdiff tx a few weeks ago. Pts scans are negative for a bleed due to his fall. INR on arrival is subtherapeutic at 1.68. Pt home dose is 3mg  daily. Pt is on warfarin for Afib.  Goal of Therapy:  INR 2-3 Monitor platelets by anticoagulation protocol: Yes   Plan:  Since INR is subtherapeutic will give 4mg  tonight. Pts current illness/potentially being restarted on antibiotics both have potential to raise INR. Will monitor closely. Will recheck INR in the AM.  11/6 INR 2.03. Restart home dose of 3 mg daily. INR in AM.  11/7 INR 2.59. Continue 3 mg daily. Daily INR ordered while pt is on antibiotics.  11/8 INR 2.84. Continue 3mg  daily.  Monitor INR daily.  11/9 INR 2.91 Continue 3mg  daily. INR trending up, likely to be supratherapeutic tomorrow and require dose reduction.  Continue to monitor daily.  Cy Blamerllison K Evelette Hollern 03/17/2015,2:03 PM

## 2015-03-17 NOTE — Clinical Social Work Note (Signed)
Clinical Social Work Assessment  Patient Details  Name: Blake White MRN: [id number] Date of Birth: Apr 03, 1934  Date of referral:  03/17/15               Reason for consult:  Facility Placement                Permission sought to share information with:  Family Supports, Chartered certified accountant granted to share information::  Yes, Verbal Permission Granted  Name::        Agency::     Relationship::     Contact Information:     Housing/Transportation Living arrangements for the past 2 months:  Single Family Home Source of Information:  Patient, Spouse Patient Interpreter Needed:  None Criminal Activity/Legal Involvement Pertinent to Current Situation/Hospitalization:  No - Comment as needed Significant Relationships:    Lives with:  Spouse Do you feel safe going back to the place where you live?  Yes Need for family participation in patient care:  Yes (Comment)  Care giving concerns: Patient lives with his wife Ronda Kazmi: [phone]) and daughter in the home.   Social Worker assessment / plan:  CSW met with patient and his wife this afternoon and discussed the the purpose of CSW visit and that physical therapy had recommended short term rehab. Patient and were aware of this recommendation and patient's wife stated that she does not believe she can manage patient at home in his current condition. Patient and wife are in agreement for bedsearch. List of facilities given to patient and wife. Will follow up with patient and wife regarding bed offers. Patient has not been to rehab before and CSW explained this process.  Employment status:  Retired Nurse, adult PT Recommendations:  Advance / Referral to community resources:  [place]  Patient/Family's Response to care:  Patient and wife are open to what is recommended.  Patient/Family's Understanding of and Emotional Response to  Diagnosis, Current Treatment, and Prognosis:  Patient and wife expressed appreciation for CSW assistance.  Emotional Assessment Appearance:  Appears stated age Attitude/Demeanor/Rapport:   (pleasant and cooperative) Affect (typically observed):  Accepting, Appropriate, Calm Orientation:  Oriented to Self, Oriented to Place, Oriented to Situation Alcohol / Substance use:  Not Applicable Psych involvement (Current and /or in the community):  No (Comment)  Discharge Needs  Concerns to be addressed:  Care Coordination Readmission within the last 30 days:  No Current discharge risk:  None Barriers to Discharge:  No Barriers Identified   Shela Leff, LCSW 03/17/2015, 2:56 PM

## 2015-03-18 ENCOUNTER — Inpatient Hospital Stay: Payer: Medicare Other

## 2015-03-18 LAB — PROTIME-INR
INR: 3
PROTHROMBIN TIME: 30.6 s — AB (ref 11.4–15.0)

## 2015-03-18 LAB — BASIC METABOLIC PANEL
ANION GAP: 8 (ref 5–15)
BUN: 74 mg/dL — ABNORMAL HIGH (ref 6–20)
CHLORIDE: 98 mmol/L — AB (ref 101–111)
CO2: 23 mmol/L (ref 22–32)
Calcium: 8.9 mg/dL (ref 8.9–10.3)
Creatinine, Ser: 2.72 mg/dL — ABNORMAL HIGH (ref 0.61–1.24)
GFR calc non Af Amer: 21 mL/min — ABNORMAL LOW (ref >60)
GFR, EST AFRICAN AMERICAN: 24 mL/min — AB (ref >60)
Glucose, Bld: 106 mg/dL — ABNORMAL HIGH (ref 65–99)
POTASSIUM: 4.1 mmol/L (ref 3.5–5.1)
SODIUM: 129 mmol/L — AB (ref 135–145)

## 2015-03-18 LAB — STOOL CULTURE: Culture: NOT DETECTED

## 2015-03-18 LAB — CBC
HCT: 35.8 % — ABNORMAL LOW (ref 40.0–52.0)
HEMOGLOBIN: 11.7 g/dL — AB (ref 13.0–18.0)
MCH: 33.7 pg (ref 26.0–34.0)
MCHC: 32.7 g/dL (ref 32.0–36.0)
MCV: 103.3 fL — ABNORMAL HIGH (ref 80.0–100.0)
Platelets: 170 10*3/uL (ref 150–440)
RBC: 3.47 MIL/uL — AB (ref 4.40–5.90)
RDW: 16.1 % — ABNORMAL HIGH (ref 11.5–14.5)
WBC: 7.8 10*3/uL (ref 3.8–10.6)

## 2015-03-18 MED ORDER — FUROSEMIDE 10 MG/ML IJ SOLN
40.0000 mg | Freq: Once | INTRAMUSCULAR | Status: AC
  Administered 2015-03-18: 40 mg via INTRAVENOUS
  Filled 2015-03-18: qty 4

## 2015-03-18 MED ORDER — WARFARIN SODIUM 5 MG PO TABS
2.5000 mg | ORAL_TABLET | Freq: Every day | ORAL | Status: DC
  Administered 2015-03-18 – 2015-03-21 (×4): 2.5 mg via ORAL
  Filled 2015-03-18 (×4): qty 1

## 2015-03-18 MED ORDER — FUROSEMIDE 40 MG PO TABS
40.0000 mg | ORAL_TABLET | Freq: Every day | ORAL | Status: DC
  Administered 2015-03-18 – 2015-03-19 (×2): 40 mg via ORAL
  Filled 2015-03-18 (×2): qty 1

## 2015-03-18 NOTE — Consult Note (Signed)
ANTICOAGULATION CONSULT NOTE - Follow Up Consult  Pharmacy Consult for warfarin Indication: VTE prophylaxis  No Known Allergies  Patient Measurements: Height: 5\' 8"  (172.7 cm) Weight: 226 lb 6.4 oz (102.694 kg) IBW/kg (Calculated) : 68.4  Vital Signs: Temp: 97.6 F (36.4 C) (11/10 0524) Temp Source: Oral (11/10 0524) BP: 118/66 mmHg (11/10 0524) Pulse Rate: 57 (11/10 0524)  Labs:  Recent Labs  03/16/15 0523 03/17/15 0524 03/18/15 0610  HGB 11.3* 11.5* 11.7*  HCT 33.9* 35.2* 35.8*  PLT 134* 146* 170  LABPROT 29.9* 30.5* 30.6*  INR 2.84 2.91 3.00  CREATININE 3.02* 2.89* 2.72*    Estimated Creatinine Clearance: 25.6 mL/min (by C-G formula based on Cr of 2.72).   Medications:  Scheduled:  . amiodarone  200 mg Oral Daily  . aspirin EC  81 mg Oral Daily  . atorvastatin  20 mg Oral QHS  . cholecalciferol  1,000 Units Oral Daily  . famotidine  20 mg Oral Daily  . levothyroxine  150 mcg Oral QAC breakfast  . midodrine  5 mg Oral TID WC  . saccharomyces boulardii  250 mg Oral BID  . vancomycin  500 mg Oral 4 times per day  . warfarin  3 mg Oral q1800  . Warfarin - Pharmacist Dosing Inpatient   Does not apply q1800    Assessment: Pt is a 81 year old male who presents to the ED after a fall. Pt has had several days of diarrhea, following cdiff tx a few weeks ago. Pts scans are negative for a bleed due to his fall. INR on arrival is subtherapeutic at 1.68. Pt home dose is 3mg  daily. Pt is on warfarin for Afib.  Goal of Therapy:  INR 2-3 Monitor platelets by anticoagulation protocol: Yes   Plan:  Since INR is subtherapeutic will give 4mg  tonight. Pts current illness/potentially being restarted on antibiotics both have potential to raise INR. Will monitor closely. Will recheck INR in the AM.  11/6 INR 2.03. Restart home dose of 3 mg daily. INR in AM.  11/7 INR 2.59. Continue 3 mg daily. Daily INR ordered while pt is on antibiotics.  11/8 INR 2.84. Continue 3mg   daily. Monitor INR daily.  11/9 INR 2.91 Continue 3mg  daily. INR trending up, likely to be supratherapeutic tomorrow and require dose reduction.  11/10 INR 3.00. Decrease dose to 2.5mg  daily, however expecting INR to be elevated tomorrow. Recheck INR in am.  Continue to monitor daily.  Cy BlamerAllison K Trust Crago 03/18/2015,1:16 PM

## 2015-03-18 NOTE — Consult Note (Signed)
Pt with abd distention, some bowel sounds, edema of toes and distal feet of concern to wife, I explained why this occurs.  Will check abd film in morning. VSS afebrile, WBC down to 7.8, albumin 2.2.  Plan stool transplant if healthy enough in 12 days.

## 2015-03-18 NOTE — Progress Notes (Addendum)
[facility] [place] at [facility]   PATIENT NAME: Blake MangesBobby White    MR#:  [id number]  DATE OF BIRTH:  02/17/1934  SUBJECTIVE:  CHIEF COMPLAINT:   Chief Complaint  Patient presents with  . Fall  . Weakness  . Diarrhea   Sleepy this morning. No new complaints  REVIEW OF SYSTEMS:   Review of Systems  Constitutional: Positive for malaise/fatigue. Negative for fever.  Respiratory: Negative for shortness of breath.   Cardiovascular: Negative for chest pain and palpitations.  Gastrointestinal: Positive for abdominal pain and diarrhea. Negative for nausea, vomiting and blood in stool.  Genitourinary: Negative for dysuria.  Neurological: Positive for weakness.    DRUG ALLERGIES:  No Known Allergies  VITALS:  Blood pressure 118/66, pulse 57, temperature 97.6 F (36.4 C), temperature source Oral, resp. rate 18, height 5\' 8"  (1.727 m), weight 102.694 kg (226 lb 6.4 oz), SpO2 95 %.  PHYSICAL EXAMINATION:  GENERAL:  81 y.o.-year-old patient lying in the bed, no distress LUNGS: Normal breath sounds bilaterally, no wheezing, rales,rhonchi or crepitation. No use of accessory muscles of respiration.  CARDIOVASCULAR: S1, S2 normal. No murmurs, rubs, or gallops.  ABDOMEN: Soft, nontender, distended, tympanic. Bowel sounds present. No organomegaly or mass.  EXTREMITIES: Trace bilateral pedal edema, cyanosis, or clubbing.  NEUROLOGIC: Cranial nerves II through XII are intact. Muscle strength 5/5 in all extremities. Sensation intact. Gait not checked.  PSYCHIATRIC: Sleepy, oriented, calm  SKIN: Large skin tear over left forearm   LABORATORY PANEL:   CBC  Recent Labs Lab 03/18/15 0610  WBC 7.8  HGB 11.7*  HCT 35.8*  PLT 170   ------------------------------------------------------------------------------------------------------------------  Chemistries   Recent Labs Lab 03/17/15 0524 03/18/15 0610  NA 131* 129*  K 4.1 4.1  CL 103 98*  CO2 23 23   GLUCOSE 102* 106*  BUN 77* 74*  CREATININE 2.89* 2.72*  CALCIUM 9.1 8.9  AST 38  --   ALT 27  --   ALKPHOS 70  --   BILITOT 0.5  --    ------------------------------------------------------------------------------------------------------------------  Cardiac Enzymes  Recent Labs Lab 03/13/15 1658  TROPONINI 0.04*   ------------------------------------------------------------------------------------------------------------------  RADIOLOGY:  Dg Chest Port 1 View  03/18/2015  CLINICAL DATA:  Shortness of breath with history of kidney failure. EXAM: PORTABLE CHEST 1 VIEW COMPARISON:  03/13/2015 FINDINGS: Sternotomy wires are intact. Lordotic technique is demonstrated as the lungs are adequately inflated with minimal linear density of the left base likely atelectasis and possible small amount of left pleural fluid. Stable cardiomegaly. Remainder of the exam is unchanged. IMPRESSION: Stable left base opacification likely small effusion with atelectasis. Mild stable cardiomegaly. Electronically Signed   By: Elberta Fortisaniel  Boyle M.D.   On: 03/18/2015 15:21    EKG:   Orders placed or performed during the hospital encounter of 03/13/15  . ED EKG  . ED EKG  . EKG 12-Lead  . EKG 12-Lead    ASSESSMENT AND PLAN:   Blake White is a 81 y.o. male with a known history of chronic atrial fibrillation, on Coumadin, congestive heart failure, hypothyroidism, chronic liver cirrhosis and recurrent C. difficile colitis was just admitted to the hospital and treated with by mouth vancomycin for C. difficile colitis. Patient finished his antibiotic course just 1 week ago but still having ongoing diarrhea. For the past 2 days his diarrhea has been worse and copious. Patient was feeling weak and lightheaded and sustained a fall but did not pass out. According to family members patient is  slightly confused and not being himself  1) recurrent Clostridium difficile colitis - Appreciate GI consultation and  consideration for fecal transplant - on high dose oral vanc - Will need to complete 2 weeks of vancomycin prior to fecal transplant. Stop date will be 11/20  #2 acute on chronic kidney disease stage III - Due to ATN volume depletion, improving - Continue to avoid nephrotoxins - appreciate nephrology following  #3 congestive heart failure: Exacerbation resolved -  Held spironolactone, metolazone and Lasix due to renal failure, will now restart Lasix, continue amiodarone - Continue daily weights I's and O's  #4 atrial fibrillation, chronic - Rate controlled - Continue Coumadin per pharmacy  5. Hypothyroidism - Continue Synthyroid  6. Generalized weakness with near syncope - PT eval pending, SNF placement needed, wife is reviewing bed offers  7. Recent history of acute esophagitis - PPI  8. Left hip pain - no fracture on xray  9. Left forearm skin tear - WOC  10 dysuria - UA negative for infection or hematuria - dc Foley in a.m.  All the records are reviewed and case discussed with Care Management/Social Workerr. Management plans discussed with the patient, family and they are in agreement.  CODE STATUS: full  TOTAL TIME TAKING CARE OF THIS PATIENT: 20 minutes.  Greater than 50% of time spent in care coordination and counseling. POSSIBLE D/C IN 2-3 DAYS, DEPENDING ON CLINICAL CONDITION.   Elby Showers M.D on 03/18/2015 at 4:51 PM  Between 7am to 6pm - Pager - [phone]  After 6pm go to [email or web address] - password [email or web address] [facility]  [facility]  Office  [phone]  CC: Primary care physician; Sula Rumple, MD

## 2015-03-18 NOTE — Progress Notes (Signed)
03/18/2015   Dr. Clent RidgesWalsh notified of wheezing, respirations of 24, and lethargy. Confusion present at times. Chest xray and 40 mg IV lasix ordered. Continue to monitor.  Ron ParkerHerron, Wyett Narine D, RN

## 2015-03-18 NOTE — Progress Notes (Signed)
Subjective:   Creatinine 2.72) (2.89 (3.02) (3.29) UOP 1275 (1150) (500)  Na 129    Objective:  Vital signs in last 24 hours:  Temp:  [97.5 F (36.4 C)-97.9 F (36.6 C)] 97.6 F (36.4 C) (11/10 0524) Pulse Rate:  [56-57] 57 (11/10 0524) Resp:  [16-18] 18 (11/10 0524) BP: (106-118)/(53-66) 118/66 mmHg (11/10 0524) SpO2:  [95 %-100 %] 95 % (11/10 0524) Weight:  [102.694 kg (226 lb 6.4 oz)] 102.694 kg (226 lb 6.4 oz) (11/10 0542)  Weight change: 0.091 kg (3.2 oz) Filed Weights   03/13/15 1612 03/17/15 0607 03/18/15 0542  Weight: 102.513 kg (226 lb) 102.604 kg (226 lb 3.2 oz) 102.694 kg (226 lb 6.4 oz)    Intake/Output:    Intake/Output Summary (Last 24 hours) at 03/18/15 1041 Last data filed at 03/18/15 0735  Gross per 24 hour  Intake      0 ml  Output   1275 ml  Net  -1275 ml     Physical Exam: General: NAD  HEENT Anicteric, dry oral mucosal membranes  Neck supple  Pulm/lungs Normal effort, clear ant and laterally  CVS/Heart No rub or gallop  Abdomen:  Soft, distended  Extremities: + dependent edema  Neurologic: Lethargic, answering questions  Skin: Left olecrenon laceration, in dressings   GU Foley catheter       Basic Metabolic Panel:   Recent Labs Lab 03/13/15 1658 03/14/15 0339 03/16/15 0523 03/17/15 0524 03/18/15 0610  NA 135 136 128* 131* 129*  K 4.5 4.7 3.9 4.1 4.1  CL 103 102 100* 103 98*  CO2 23 22 21* 23 23  GLUCOSE 107* 115* 102* 102* 106*  BUN 70* 68* 82* 77* 74*  CREATININE 3.33* 3.29* 3.02* 2.89* 2.72*  CALCIUM 9.7 9.9 8.9 9.1 8.9     CBC:  Recent Labs Lab 03/13/15 1658 03/14/15 0339 03/16/15 0523 03/17/15 0524 03/18/15 0610  WBC 9.5 13.4* 10.8* 8.8 7.8  HGB 12.0* 12.2* 11.3* 11.5* 11.7*  HCT 36.1* 36.6* 33.9* 35.2* 35.8*  MCV 101.6* 101.9* 102.4* 102.5* 103.3*  PLT 137* 129* 134* 146* 170      Microbiology:  Recent Results (from the past 720 hour(s))  C difficile quick scan w PCR reflex     Status: Abnormal    Collection Time: 02/19/15  6:49 PM  Result Value Ref Range Status   C Diff antigen POSITIVE (A) NEGATIVE Final   C Diff toxin POSITIVE (A) NEGATIVE Final   C Diff interpretation   Final    Positive for toxigenic C. difficile, active toxin production present.    Comment: CRITICAL RESULT CALLED TO, READ BACK BY AND VERIFIED WITH: [facility] ERDESKY AT 2054 02/19/15.PMH   C difficile quick scan w PCR reflex     Status: Abnormal   Collection Time: 03/13/15  4:58 PM  Result Value Ref Range Status   C Diff antigen POSITIVE (A) NEGATIVE Final   C Diff toxin POSITIVE (A) NEGATIVE Final   C Diff interpretation   Final    Positive for toxigenic C. difficile, active toxin production present.    Comment: CRITICAL RESULT CALLED TO, READ BACK BY AND VERIFIED WITH: [facility] MOFFITT RN AT 1830 03/13/2015   Urine culture     Status: None   Collection Time: 03/14/15  6:35 PM  Result Value Ref Range Status   Specimen Description URINE, RANDOM  Final   Special Requests NONE  Final   Culture NO GROWTH 1 DAY  Final   Report Status 03/16/2015  FINAL  Final  Stool culture     Status: None (Preliminary result)   Collection Time: 03/15/15  1:49 AM  Result Value Ref Range Status   Specimen Description STOOL  Final   Special Requests NONE  Final   Culture   Final    NO CAMPYLOBACTER DETECTED HOLDING FOR POSSIBLE PATHOGEN No Pathogenic E. coli detected    Report Status PENDING  Incomplete    Coagulation Studies:  Recent Labs  03/16/15 0523 03/17/15 0524 03/18/15 0610  LABPROT 29.9* 30.5* 30.6*  INR 2.84 2.91 3.00    Urinalysis: No results for input(s): COLORURINE, LABSPEC, PHURINE, GLUCOSEU, HGBUR, BILIRUBINUR, KETONESUR, PROTEINUR, UROBILINOGEN, NITRITE, LEUKOCYTESUR in the last 72 hours.  Invalid input(s): APPERANCEUR    Imaging: No results found.   Medications:     . amiodarone  200 mg Oral Daily  . aspirin EC  81 mg Oral Daily  . atorvastatin  20 mg Oral QHS  . cholecalciferol   1,000 Units Oral Daily  . famotidine  20 mg Oral Daily  . levothyroxine  150 mcg Oral QAC breakfast  . midodrine  5 mg Oral TID WC  . saccharomyces boulardii  250 mg Oral BID  . vancomycin  500 mg Oral 4 times per day  . warfarin  3 mg Oral q1800  . Warfarin - Pharmacist Dosing Inpatient   Does not apply q1800   acetaminophen **OR** acetaminophen, oxyCODONE  Assessment/ Plan:  81 y.o. white male with congestive heart failure, atrial fibrillation, chronic anticoagulation, hypothyroidism, BPH, hyperlipidemia, hypertension, coronary artery disease status post CABG,Liver cirrhosis who was admitted to [facility] for C. Diff colitis.   1. Acute kidney failure on chronic kidney disease stage IV. Baseline 2.78/GFR 20. [facility]  Creatinine improving with oral and IV intake, increasing peripheral edema and lowering sodium levels so agree with hodling IV fluids and diuretics.   2. Hyponatremia: hypervolemic hypo-osmotic hyponatremia. Consistent with volume overload from hepatic cirrhosis and congestive heart failure systolic with EF of 30-35% (echo 01/17/15) - will need to eventually restart furosemide. Continue to hold metolazone.   2. Edema: with history of hepatic cirrhosis.  - holding diuretics. Home regimen of spironolactone, metolazone and furosemide.   3. Hypotension: blood pressure at goal - continue midodrine.   4. Infective Colitis: C. Diff A09 - on metronidazole and vancomycin.  - probiotic prescribed.  - Appreciate GI input.     LOS: 5 Aralyn Nowak 11/10/201610:41 AM

## 2015-03-18 NOTE — Clinical Social Work Note (Signed)
CSW has extended bed offers to patient's wife this morning. Patient's wife was going to review the list and get back with CSW to inform of decision. York SpanielMonica Devlynn Knoff MSW,LCSW [phone]

## 2015-03-18 NOTE — Consult Note (Signed)
WOC wound consult note Reason for Consult:Unnas boots change, wound care to skin tear left arm. Wound type: Chronic venous stasis, managed at  [facility]  Weekly Unnas boots Trauma wound to left forearm, fall prior to admission Pressure Ulcer POA: [name] Measurement:2 lesions 3 cm x 0.2 cmx 0.1 cm scabbed around edges; 2.4 cm x 0.3 cm x 0.1 cm edges not approximated, skin defect present bleeds with dressing change .  Wound WUJ:WJXBJYNbed:Scabbed and pale pink nongranulating Drainage (amount, consistency, odor) Bleeds with dressing removal.  Scant serosanguinous.  No odor.  Periwound:Ecchymosis Dressing procedure/placement/frequency:Cleanse left arm with NS and pat gently dry.  Apply MEpitel contact layer to wound bed.  Cover with 4x4 gauze/kerlix and tape.  Change daily.   Cleanse bilateral legs with soap and water.  Apply zinc layer followed by [place].  Change weekly.  Will not follow at this time.  Please re-consult if needed.  Maple HudsonKaren Marigny Borre RN BSN CWON Pager [phone]

## 2015-03-19 ENCOUNTER — Inpatient Hospital Stay: Payer: Medicare Other

## 2015-03-19 LAB — BASIC METABOLIC PANEL
ANION GAP: 9 (ref 5–15)
BUN: 77 mg/dL — ABNORMAL HIGH (ref 6–20)
CALCIUM: 9.2 mg/dL (ref 8.9–10.3)
CHLORIDE: 98 mmol/L — AB (ref 101–111)
CO2: 24 mmol/L (ref 22–32)
CREATININE: 2.76 mg/dL — AB (ref 0.61–1.24)
GFR calc non Af Amer: 20 mL/min — ABNORMAL LOW (ref >60)
GFR, EST AFRICAN AMERICAN: 24 mL/min — AB (ref >60)
Glucose, Bld: 95 mg/dL (ref 65–99)
Potassium: 4.5 mmol/L (ref 3.5–5.1)
SODIUM: 131 mmol/L — AB (ref 135–145)

## 2015-03-19 LAB — CBC
HCT: 36 % — ABNORMAL LOW (ref 40.0–52.0)
Hemoglobin: 11.9 g/dL — ABNORMAL LOW (ref 13.0–18.0)
MCH: 33.7 pg (ref 26.0–34.0)
MCHC: 32.9 g/dL (ref 32.0–36.0)
MCV: 102.3 fL — ABNORMAL HIGH (ref 80.0–100.0)
Platelets: 189 10*3/uL (ref 150–440)
RBC: 3.52 MIL/uL — AB (ref 4.40–5.90)
RDW: 15.9 % — ABNORMAL HIGH (ref 11.5–14.5)
WBC: 9.2 10*3/uL (ref 3.8–10.6)

## 2015-03-19 LAB — PROTIME-INR
INR: 2.83
Prothrombin Time: 29.3 seconds — ABNORMAL HIGH (ref 11.4–15.0)

## 2015-03-19 MED ORDER — FUROSEMIDE 10 MG/ML IJ SOLN
40.0000 mg | Freq: Every day | INTRAMUSCULAR | Status: DC
  Administered 2015-03-19 – 2015-03-20 (×2): 40 mg via INTRAVENOUS
  Filled 2015-03-19 (×2): qty 4

## 2015-03-19 MED ORDER — BISACODYL 10 MG RE SUPP
10.0000 mg | Freq: Once | RECTAL | Status: AC
  Administered 2015-03-19: 10 mg via RECTAL
  Filled 2015-03-19: qty 1

## 2015-03-19 NOTE — Progress Notes (Signed)
Subjective:   More confused and lethargic this morning.  IV furosemide 40mg  given yesterday due to respiratory distress.  Creatinine 2.76 (2.72) (2.89 (3.02) (3.29) UOP 2250 (1275) (1150) (500)  Na 131 (129)    Objective:  Vital signs in last 24 hours:  Temp:  [98 F (36.7 C)-98.8 F (37.1 C)] 98.8 F (37.1 C) (11/11 0520) Pulse Rate:  [54-67] 67 (11/11 0520) Resp:  [18-20] 20 (11/11 0520) BP: (106-111)/(49-57) 111/57 mmHg (11/11 0520) SpO2:  [88 %-96 %] 96 % (11/11 0520) Weight:  [102.15 kg (225 lb 3.2 oz)] 102.15 kg (225 lb 3.2 oz) (11/11 0500)  Weight change: -0.544 kg (-1 lb 3.2 oz) Filed Weights   03/17/15 0607 03/18/15 0542 03/19/15 0500  Weight: 102.604 kg (226 lb 3.2 oz) 102.694 kg (226 lb 6.4 oz) 102.15 kg (225 lb 3.2 oz)    Intake/Output:    Intake/Output Summary (Last 24 hours) at 03/19/15 0938 Last data filed at 03/19/15 0520  Gross per 24 hour  Intake    500 ml  Output   2250 ml  Net  -1750 ml     Physical Exam: General: NAD  HEENT Anicteric, dry oral mucosal membranes  Neck supple  Pulm/lungs Normal effort, clear ant and laterally  CVS/Heart No rub or gallop  Abdomen:  Soft, distended  Extremities: ++ dependent edema  Neurologic: Lethargic, answering questions, slurred speech  Skin: Left olecrenon laceration, in dressings   GU Foley catheter       Basic Metabolic Panel:   Recent Labs Lab 03/14/15 0339 03/16/15 0523 03/17/15 0524 03/18/15 0610 03/19/15 0501  NA 136 128* 131* 129* 131*  K 4.7 3.9 4.1 4.1 4.5  CL 102 100* 103 98* 98*  CO2 22 21* 23 23 24   GLUCOSE 115* 102* 102* 106* 95  BUN 68* 82* 77* 74* 77*  CREATININE 3.29* 3.02* 2.89* 2.72* 2.76*  CALCIUM 9.9 8.9 9.1 8.9 9.2     CBC:  Recent Labs Lab 03/14/15 0339 03/16/15 0523 03/17/15 0524 03/18/15 0610 03/19/15 0501  WBC 13.4* 10.8* 8.8 7.8 9.2  HGB 12.2* 11.3* 11.5* 11.7* 11.9*  HCT 36.6* 33.9* 35.2* 35.8* 36.0*  MCV 101.9* 102.4* 102.5* 103.3* 102.3*  PLT  129* 134* 146* 170 189      Microbiology:  Recent Results (from the past 720 hour(s))  C difficile quick scan w PCR reflex     Status: Abnormal   Collection Time: 02/19/15  6:49 PM  Result Value Ref Range Status   C Diff antigen POSITIVE (A) NEGATIVE Final   C Diff toxin POSITIVE (A) NEGATIVE Final   C Diff interpretation   Final    Positive for toxigenic C. difficile, active toxin production present.    Comment: CRITICAL RESULT CALLED TO, READ BACK BY AND VERIFIED WITH: [facility] ERDESKY AT 2054 02/19/15.PMH   C difficile quick scan w PCR reflex     Status: Abnormal   Collection Time: 03/13/15  4:58 PM  Result Value Ref Range Status   C Diff antigen POSITIVE (A) NEGATIVE Final   C Diff toxin POSITIVE (A) NEGATIVE Final   C Diff interpretation   Final    Positive for toxigenic C. difficile, active toxin production present.    Comment: CRITICAL RESULT CALLED TO, READ BACK BY AND VERIFIED WITH: [facility] MOFFITT RN AT 1830 03/13/2015   Urine culture     Status: None   Collection Time: 03/14/15  6:35 PM  Result Value Ref Range Status   Specimen Description URINE,  RANDOM  Final   Special Requests NONE  Final   Culture NO GROWTH 1 DAY  Final   Report Status 03/16/2015 FINAL  Final  Stool culture     Status: None   Collection Time: 03/15/15  1:49 AM  Result Value Ref Range Status   Specimen Description STOOL  Final   Special Requests NONE  Final   Culture   Final    NO CAMPYLOBACTER DETECTED NO SALMONELLA OR SHIGELLA ISOLATED No Pathogenic E. coli detected    Report Status 03/18/2015 FINAL  Final    Coagulation Studies:  Recent Labs  03/17/15 0524 03/18/15 0610 03/19/15 0501  LABPROT 30.5* 30.6* 29.3*  INR 2.91 3.00 2.83    Urinalysis: No results for input(s): COLORURINE, LABSPEC, PHURINE, GLUCOSEU, HGBUR, BILIRUBINUR, KETONESUR, PROTEINUR, UROBILINOGEN, NITRITE, LEUKOCYTESUR in the last 72 hours.  Invalid input(s): APPERANCEUR    Imaging: Dg Chest Port 1  View  03/18/2015  CLINICAL DATA:  Shortness of breath with history of kidney failure. EXAM: PORTABLE CHEST 1 VIEW COMPARISON:  03/13/2015 FINDINGS: Sternotomy wires are intact. Lordotic technique is demonstrated as the lungs are adequately inflated with minimal linear density of the left base likely atelectasis and possible small amount of left pleural fluid. Stable cardiomegaly. Remainder of the exam is unchanged. IMPRESSION: Stable left base opacification likely small effusion with atelectasis. Mild stable cardiomegaly. Electronically Signed   By: Elberta Fortis M.D.   On: 03/18/2015 15:21   Dg Abd 2 Views  03/19/2015  CLINICAL DATA:  81 year old male with abdominal pain, distention, and diarrhea after fall. Initial encounter. EXAM: ABDOMEN - 2 VIEW COMPARISON:  Portable chest radiograph 01/16/2015. FINDINGS: Upright and supine views of the abdomen and pelvis. No pneumoperitoneum. Curvilinear opacity at the left lung base most resembles atelectasis. Stable visualized mediastinal contours. Previous CABG. Redundant colon with widespread large bowel gaseous distension. Retained stool in the right abdomen. Gas-filled but nondilated small bowel loops throughout the visible abdomen. The large bowel gas continues into the pelvis. Cholecystectomy clips. Left inguinal surgical clips. Pelvic phleboliths. No acute osseous abnormality identified. IMPRESSION: 1. Gas throughout small and redundant large bowel loops most suggestive of diffuse ileus. 2. No abdominal free air. 3. Left lower lobe atelectasis. Electronically Signed   By: Odessa Fleming M.D.   On: 03/19/2015 08:36     Medications:     . amiodarone  200 mg Oral Daily  . aspirin EC  81 mg Oral Daily  . atorvastatin  20 mg Oral QHS  . cholecalciferol  1,000 Units Oral Daily  . famotidine  20 mg Oral Daily  . furosemide  40 mg Intravenous Daily  . furosemide  40 mg Oral Daily  . levothyroxine  150 mcg Oral QAC breakfast  . midodrine  5 mg Oral TID WC  .  saccharomyces boulardii  250 mg Oral BID  . vancomycin  500 mg Oral 4 times per day  . warfarin  2.5 mg Oral q1800  . Warfarin - Pharmacist Dosing Inpatient   Does not apply q1800   acetaminophen **OR** acetaminophen, oxyCODONE  Assessment/ Plan:  81 y.o. white male with congestive heart failure, atrial fibrillation, chronic anticoagulation, hypothyroidism, BPH, hyperlipidemia, hypertension, coronary artery disease status post CABG,Liver cirrhosis who was admitted to [facility] for C. Diff colitis.   1. Acute kidney failure on chronic kidney disease stage IV. Baseline 2.78/GFR 20. Followed by [facility] as an outpatient.  Creatinine improving with oral and IV intake, increasing peripheral edema and lowering sodium levels so  agree with hodling IV fluids and diuretics.   2. Hyponatremia: hypervolemic hypo-osmotic hyponatremia. Consistent with volume overload from hepatic cirrhosis and congestive heart failure systolic with EF of 30-35% (echo 01/17/15) - restart furosemide  IV - Continue to hold metolazone.   2. Acute CHF and Edema: with history of hepatic cirrhosis and systolic congestive heart failure EF 30-35% 01/17/15 - furosemide as above.  - Home regimen of spironolactone, metolazone and furosemide.   3. Hypotension: blood pressure at goal - continue midodrine.   4. Infective Colitis: C. Diff A09 - on metronidazole and vancomycin.  - probiotic prescribed.  - Appreciate GI input.     LOS: 6 Daylani Deblois 11/11/20169:38 AM

## 2015-03-19 NOTE — Progress Notes (Signed)
Pt. Complaining of pain in the arms and legs this AM. Legs extremely swollen and firm to the touch. UNA boot on lateral ext. Tight and squeezing leg. Boots removed with ease of pain noted by PT. MD notified, and orders to give scheduled Lasik PO early and to keep foley in.

## 2015-03-19 NOTE — Progress Notes (Signed)
Physical Therapy Treatment Patient Details Name: Blake White MRN: [id number] DOB: 04/06/1934 Today's Date: 03/19/2015    History of Present Illness Pt is a 81 y.o. male presenting with generalized weakness, ongoing diarrhea, and fall.  Pt admitted with AMS with recurrent C. difficile colitis.  Pt with L hip pain (imaging negative for fx); CT head negative.  Pt with recent hospital stay September 2016 (d/t AKI and c-diff).  PMH includes chronic a-fib on Coumadin, CHF, recurrent c-diff, CVA.    PT Comments    Patient notably lethargic during supine activities, but improves with transition to upright.  Able to complete with max assist +1-2 this date (improved from dep, unable to complete full transition, on evaluation), but fatigues quickly and requires return to supine after 3-4 min unsupported static sitting.  Would benefit from continued PT as tolerated with progression towards OOB to chair as appropriate.   Follow Up Recommendations  SNF     Equipment Recommendations       Recommendations for Other Services       Precautions / Restrictions Precautions Precautions: Fall Precaution Comments: enteric isolation Restrictions Weight Bearing Restrictions: No    Mobility  Bed Mobility Overal bed mobility: Needs Assistance Bed Mobility: Supine to Sit     Supine to sit: Max assist Sit to supine: Max assist;+2 for physical assistance   General bed mobility comments: assist for LE management and truncal elevation.  Hand-over-hand guidance for UEs placement on bedrails for patient to assist with mobility.  Transfers                 General transfer comment: Unsafe/unable at this time due to weakness/fatigue  Ambulation/Gait             General Gait Details: Unsafe/unable at this time due to weakness/fatigue   Stairs            Wheelchair Mobility    Modified Rankin (Stroke Patients Only)       Balance Overall balance assessment: Needs  assistance Sitting-balance support: No upper extremity supported;Feet supported Sitting balance-Leahy Scale: Fair Sitting balance - Comments: able to maintain unsupported sitting x3-4 min prior to fatigue and need to return to supine                            Cognition Arousal/Alertness: Lethargic Behavior During Therapy: [facility] for tasks assessed/performed                        Exercises Other Exercises Other Exercises: Supine LE therex, 1x10, act assist ROM: ankle pumps, quad sets, SAQs, heel slides, hip abduct/adduct.  Constant verbal cuing for alertness, attention to task and active effort; notably fatigued/lethargic, consistently closing eyes during supine activities. Other Exercises: Unsupported sitting balance, close sup, with minimal UE support.  Vitals stable and WFL.  Fatigues quickly and requires return to supine within 3-4 min of upright positioning; unable to integrate additional functional activity at this time.    General Comments        Pertinent Vitals/Pain Pain Assessment: No/denies pain    Home Living                      Prior Function            PT Goals (current goals can now be found in the care plan section) Acute Rehab PT Goals Patient Stated Goal: to get stronger PT Goal  Formulation: With patient/family Time For Goal Achievement: 03/30/15 Potential to Achieve Goals: Fair    Frequency  Min 2X/week    PT Plan      Co-evaluation             End of Session   Activity Tolerance: Patient limited by fatigue Patient left: in bed;with call bell/phone within reach;with bed alarm set;with family/visitor present     Time: 4098-1191 PT Time Calculation (min) (ACUTE ONLY): 28 min  Charges:  $Therapeutic Exercise: 8-22 mins $Therapeutic Activity: 8-22 mins                    G Codes:      Mickie Badders H. Manson Passey, PT, DPT, NCS 03/19/2015, 1:07 PM [phone]

## 2015-03-19 NOTE — Progress Notes (Signed)
[facility] [place] at [facility]   PATIENT NAME: Blake MangesBobby White    MR#:  [id number]  DATE OF BIRTH:  02/07/1934  SUBJECTIVE:diarrhea is less today. no shortness of breath. Patient had a worsening pedal edema today.   CHIEF COMPLAINT:   Chief Complaint  Patient presents with  . Fall  . Weakness  . Diarrhea   Sleepy this morning. No new complaints  REVIEW OF SYSTEMS:   Review of Systems  Constitutional: Positive for malaise/fatigue. Negative for fever.  Respiratory: Negative for shortness of breath.   Cardiovascular: Negative for chest pain and palpitations.  Gastrointestinal: Positive for abdominal pain and diarrhea. Negative for nausea, vomiting and blood in stool.  Genitourinary: Negative for dysuria.  Neurological: Positive for weakness.    DRUG ALLERGIES:  No Known Allergies  VITALS:  Blood pressure 111/57, pulse 67, temperature 98.8 F (37.1 C), temperature source Oral, resp. rate 20, height 5\' 8"  (1.727 m), weight 102.15 kg (225 lb 3.2 oz), SpO2 96 %.  PHYSICAL EXAMINATION:  GENERAL:  81 y.o.-year-old patient lying in the bed, no distress LUNGS: Normal breath sounds bilaterally, no wheezing, rales,rhonchi or crepitation. No use of accessory muscles of respiration.  CARDIOVASCULAR: S1, S2 normal. No murmurs, rubs, or gallops.  ABDOMEN: Soft, nontender, distended, tympanic. Bowel sounds present. No organomegaly or mass.  EXTREMITIES: Bilateral pedal edema present. NEUROLOGIC: Cranial nerves II through XII are intact. Muscle strength 5/5 in all extremities. Sensation intact. Gait not checked.  PSYCHIATRIC: Sleepy, oriented, calm  SKIN: Large skin tear over left forearm   LABORATORY PANEL:   CBC  Recent Labs Lab 03/19/15 0501  WBC 9.2  HGB 11.9*  HCT 36.0*  PLT 189   ------------------------------------------------------------------------------------------------------------------  Chemistries   Recent Labs Lab 03/17/15 0524   03/19/15 0501  NA 131*  < > 131*  K 4.1  < > 4.5  CL 103  < > 98*  CO2 23  < > 24  GLUCOSE 102*  < > 95  BUN 77*  < > 77*  CREATININE 2.89*  < > 2.76*  CALCIUM 9.1  < > 9.2  AST 38  --   --   ALT 27  --   --   ALKPHOS 70  --   --   BILITOT 0.5  --   --   < > = values in this interval not displayed. ------------------------------------------------------------------------------------------------------------------  Cardiac Enzymes  Recent Labs Lab 03/13/15 1658  TROPONINI 0.04*   ------------------------------------------------------------------------------------------------------------------  RADIOLOGY:  Dg Chest Port 1 View  03/18/2015  CLINICAL DATA:  Shortness of breath with history of kidney failure. EXAM: PORTABLE CHEST 1 VIEW COMPARISON:  03/13/2015 FINDINGS: Sternotomy wires are intact. Lordotic technique is demonstrated as the lungs are adequately inflated with minimal linear density of the left base likely atelectasis and possible small amount of left pleural fluid. Stable cardiomegaly. Remainder of the exam is unchanged. IMPRESSION: Stable left base opacification likely small effusion with atelectasis. Mild stable cardiomegaly. Electronically Signed   By: Elberta Fortisaniel  Boyle M.D.   On: 03/18/2015 15:21   Dg Abd 2 Views  03/19/2015  CLINICAL DATA:  81 year old male with abdominal pain, distention, and diarrhea after fall. Initial encounter. EXAM: ABDOMEN - 2 VIEW COMPARISON:  Portable chest radiograph 01/16/2015. FINDINGS: Upright and supine views of the abdomen and pelvis. No pneumoperitoneum. Curvilinear opacity at the left lung base most resembles atelectasis. Stable visualized mediastinal contours. Previous CABG. Redundant colon with widespread large bowel gaseous distension. Retained stool in the right  abdomen. Gas-filled but nondilated small bowel loops throughout the visible abdomen. The large bowel gas continues into the pelvis. Cholecystectomy clips. Left inguinal surgical  clips. Pelvic phleboliths. No acute osseous abnormality identified. IMPRESSION: 1. Gas throughout small and redundant large bowel loops most suggestive of diffuse ileus. 2. No abdominal free air. 3. Left lower lobe atelectasis. Electronically Signed   By: Odessa Fleming M.D.   On: 03/19/2015 08:36    EKG:   Orders placed or performed during the hospital encounter of 03/13/15  . ED EKG  . ED EKG  . EKG 12-Lead  . EKG 12-Lead    ASSESSMENT AND PLAN:   Blake White is a 81 y.o. male with a known history of chronic atrial fibrillation, on Coumadin, congestive heart failure, hypothyroidism, chronic liver cirrhosis and recurrent C. difficile colitis was just admitted to the hospital and treated with by mouth vancomycin for C. difficile colitis. Patient finished his antibiotic course just 1 week ago but still having ongoing diarrhea. For the past 2 days his diarrhea has been worse and copious. Patient was feeling weak and lightheaded and sustained a fall but did not pass out. According to family members patient is slightly confused and not being himself  1) recurrent Clostridium difficile colitis - Appreciate GI consultation and consideration for fecal transplant - on high dose oral vanc,flagyl. - Will need to complete 2 weeks of vancomycin prior to fecal transplant. Stop date will be 11/20 Continue by mouth vancomycin  #2 acute on chronic kidney disease stage 4 - Due to ATN volume depletion, improving - Continue to avoid nephrotoxins - appreciate nephrology following  #3 congestive heart failure: Exacerbation ; pedal edema: Started on IV Lasix today. - will now restart Lasix, continue amiodarone - Continue daily weights I's and O's  #4 atrial fibrillation, chronic - Rate controlled;continue  amiodarone. - Continue Coumadin per pharmacy  5. Hypothyroidism - Continue Synthyroid  6. Generalized weakness with near syncope - PT eval pending, SNF placement needed, wife is reviewing bed  offers  7. Recent history of acute esophagitis - PPI  8. Left hip pain - no fracture on xray  9. Left forearm skin tear - WOC  10 dysuria - UA negative for infection or hematuria 11.Hyponatremia: hypervolemic hypo-osmotic hyponatremia. Consistent with volume overload from hepatic cirrhosis and congestive heart failure systolic with EF of 30-35% (echo 01/17/15) - restarted  furosemide  IV - Continue to hold metolazone.  All the records are reviewed and case discussed with Care Management/Social Workerr. Management plans discussed with the patient, family and they are in agreement.  CODE STATUS: full  TOTAL TIME TAKING CARE OF THIS PATIENT: 25 minutes.   POSSIBLE D/C IN 2-3 DAYS, DEPENDING ON CLINICAL CONDITION.   Katha Hamming M.D on 03/19/2015 at 1:16 PM  Between 7am to 6pm - Pager - [phone]  After 6pm go to [email or web address] - password [email or web address] [facility]  [place] [facility]  Office  [phone]  CC: Primary care physician; Sula Rumple, MD

## 2015-03-19 NOTE — Consult Note (Signed)
ANTICOAGULATION CONSULT NOTE - Follow Up Consult  Pharmacy Consult for warfarin Indication: VTE prophylaxis  No Known Allergies  Patient Measurements: Height: 5\' 8"  (172.7 cm) Weight: 225 lb 3.2 oz (102.15 kg) IBW/kg (Calculated) : 68.4  Vital Signs: Temp: 98.8 F (37.1 C) (11/11 0520) Temp Source: Oral (11/11 0520) BP: 111/57 mmHg (11/11 0520) Pulse Rate: 67 (11/11 0520)  Labs:  Recent Labs  03/17/15 0524 03/18/15 0610 03/19/15 0501  HGB 11.5* 11.7* 11.9*  HCT 35.2* 35.8* 36.0*  PLT 146* 170 189  LABPROT 30.5* 30.6* 29.3*  INR 2.91 3.00 2.83  CREATININE 2.89* 2.72* 2.76*    Estimated Creatinine Clearance: 25.1 mL/min (by C-G formula based on Cr of 2.76).   Medications:  Scheduled:  . amiodarone  200 mg Oral Daily  . aspirin EC  81 mg Oral Daily  . atorvastatin  20 mg Oral QHS  . cholecalciferol  1,000 Units Oral Daily  . famotidine  20 mg Oral Daily  . furosemide  40 mg Intravenous Daily  . levothyroxine  150 mcg Oral QAC breakfast  . midodrine  5 mg Oral TID WC  . saccharomyces boulardii  250 mg Oral BID  . vancomycin  500 mg Oral 4 times per day  . warfarin  2.5 mg Oral q1800  . Warfarin - Pharmacist Dosing Inpatient   Does not apply q1800    Assessment: Pt is a 81 year old male who presents to the ED after a fall. Pt has had several days of diarrhea, following cdiff tx a few weeks ago. Pts scans are negative for a bleed due to his fall. INR on arrival is subtherapeutic at 1.68. Pt home dose is 3mg  daily. Pt is on warfarin for Afib.  Goal of Therapy:  INR 2-3 Monitor platelets by anticoagulation protocol: Yes   Plan:  Since INR is subtherapeutic will give 4mg  tonight. Pts current illness/potentially being restarted on antibiotics both have potential to raise INR. Will monitor closely. Will recheck INR in the AM.  11/6 INR 2.03. Restart home dose of 3 mg daily. INR in AM.  11/7 INR 2.59. Continue 3 mg daily. Daily INR ordered while pt is on  antibiotics.  11/8 INR 2.84. Continue 3mg  daily. Monitor INR daily.  11/9 INR 2.91 Continue 3mg  daily. INR trending up, likely to be supratherapeutic tomorrow and require dose reduction.  11/10 INR 3.00. Decrease dose to 2.5mg  daily, however expecting INR to be elevated tomorrow. Recheck INR in am.  11/11 INR 2.83. Continue with decreased dose of 2.5mg  daily. Will follow up on INR tomorrow.  Continue to monitor daily.  Cy BlamerAllison K Rocsi Hazelbaker 03/19/2015,11:06 AM

## 2015-03-19 NOTE — Care Management Important Message (Signed)
Important Message  Patient Details  Name: Blake White MRN: [id number] Date of Birth: 02/25/1934   Medicare Important Message Given:  Yes    [name], Camdin Hegner L, RN 03/19/2015, 1:11 PM

## 2015-03-19 NOTE — Progress Notes (Signed)
Initial Nutrition Assessment   INTERVENTION:   Meals and Snacks: Cater to patient preferences Medical Food Supplement Therapy: will send Mighty Shakes BID for added nutrition (each shake provides 300kcals, 9g protein)   NUTRITION DIAGNOSIS:   Inadequate oral intake related to altered GI function as evidenced by per patient/family report.  GOAL:   Patient will meet greater than or equal to 90% of their needs  MONITOR:    (Energy Intake, Gastrointestinal Profile, Electrolyte and renal Profile, Anthropometrics)  REASON FOR ASSESSMENT:   LOS    ASSESSMENT:   Pt admitted with recurrent colitis d/t c.diff. Pt remains on isolation. Per GI MD note, plan for fecal transplant outpatient.   Past Medical History  Diagnosis Date  . CHF (congestive heart failure) (HCC)   . BPH (benign prostatic hyperplasia)   . A-fib (HCC)   . Thyroid disease   . High cholesterol   . Bilateral lower extremity edema   . CAD (coronary artery disease)   . Cirrhosis (HCC)   . CVA (cerebral vascular accident) (HCC)   . Gout   . Hypothyroidism   . Myocardial infarction (HCC)   . Obesity      Diet Order:  Diet Heart Room service appropriate?: Yes; Fluid consistency:: Thin    Current Nutrition: Pt reports eating 100% of omelete this am however bites of home fries. Pt wife reports not being present this am to see tray. Recorded po intake on average 52% of meals.   Food/Nutrition-Related History: Pt wife reports pt usually eats 'with longer stretches between meals.' Pt wife reports pt usually eats 2-3 meals per day, breakfast, sandwich for lunch and then a typical dinner.    Scheduled Medications:  . amiodarone  200 mg Oral Daily  . aspirin EC  81 mg Oral Daily  . atorvastatin  20 mg Oral QHS  . cholecalciferol  1,000 Units Oral Daily  . famotidine  20 mg Oral Daily  . furosemide  40 mg Intravenous Daily  . levothyroxine  150 mcg Oral QAC breakfast  . midodrine  5 mg Oral TID WC  .  saccharomyces boulardii  250 mg Oral BID  . vancomycin  500 mg Oral 4 times per day  . warfarin  2.5 mg Oral q1800  . Warfarin - Pharmacist Dosing Inpatient   Does not apply q1800    Electrolyte/Renal Profile and Glucose Profile:   Recent Labs Lab 03/17/15 0524 03/18/15 0610 03/19/15 0501  NA 131* 129* 131*  K 4.1 4.1 4.5  CL 103 98* 98*  CO2 BUN 77* 74* 77*  CREATININE 2.89* 2.72* 2.76*  CALCIUM 9.1 8.9 9.2  GLUCOSE 102* 106* 95   Protein Profile:  Recent Labs Lab 03/13/15 1658 03/14/15 0339 03/17/15 0524  ALBUMIN 2.8* 2.7* 2.2*    Gastrointestinal Profile: Last BM:  03/19/2015   Nutrition-Focused Physical Exam Findings:  Unable to complete Nutrition-Focused physical exam at this time.    Weight Change: Pt reports weighing everyday. Pt reports weight of 226lbs before admission, and that it was as low as 216lbs. Pt reports weight fluctuates secondary to fluid.   Height:   Ht Readings from Last 1 Encounters:  03/13/15  (1.727 m)    Weight:   Wt Readings from Last 1 Encounters:  03/19/15 225 lb 3.2 oz (102.15 kg)    Wt Readings from Last 10 Encounters:  03/19/15 225 lb 3.2 oz (102.15 kg)  03/04/15 226 lb (102.513 kg)  02/19/15 216 lb (97.977 kg)  02/13/15 215 lb (97.523 kg)  01/20/15 239 lb 8 oz (108.636 kg)  01/12/15 235 lb 3.2 oz (106.686 kg)     BMI:  Body mass index is 34.25 kg/(m^2).  Estimated Nutritional Needs:   Kcal:  using IBW of 70kg, BEE: 1384kcals, TEE: (IF 1.1-1.3)(AF 1.2) 1827-2160kcals  Protein:  70-84g protein (1.0-1.2gKg)  Fluid:  1750-211100mL of lfuid (25-2930mL/kg)   EDUCATION NEEDS:   No education needs identified at this time   MODERATE Care Level  Leda QuailAllyson Artie Takayama, RD, LDN Pager [phone]

## 2015-03-19 NOTE — Consult Note (Signed)
Pt with altered mental status, able to talk but speech slurred, some decreased strength in right hand, wife concerned about possible stroke, could be due to prolonged hospitalization.  Will order neurology consult for tomorrow.  Pt with distended somewhat tight abdomen,bowel sounds present, abd films read as gas in colon and small bowel.  He is on vancomycin and no bowel movement in 2 days, will give dulcolax supp tonight.    Dr.Oh to cover this weekend.

## 2015-03-20 ENCOUNTER — Inpatient Hospital Stay: Payer: Medicare Other

## 2015-03-20 ENCOUNTER — Encounter: Payer: Self-pay | Admitting: Gastroenterology

## 2015-03-20 LAB — URINALYSIS COMPLETE WITH MICROSCOPIC (ARMC ONLY)
BILIRUBIN URINE: NEGATIVE
Bacteria, UA: NONE SEEN
Glucose, UA: NEGATIVE mg/dL
KETONES UR: NEGATIVE mg/dL
Leukocytes, UA: NEGATIVE
Nitrite: NEGATIVE
PROTEIN: NEGATIVE mg/dL
Specific Gravity, Urine: 1.009 (ref 1.005–1.030)
pH: 5 (ref 5.0–8.0)

## 2015-03-20 LAB — BASIC METABOLIC PANEL
ANION GAP: 10 (ref 5–15)
BUN: 84 mg/dL — ABNORMAL HIGH (ref 6–20)
CALCIUM: 9.4 mg/dL (ref 8.9–10.3)
CO2: 23 mmol/L (ref 22–32)
Chloride: 99 mmol/L — ABNORMAL LOW (ref 101–111)
Creatinine, Ser: 2.81 mg/dL — ABNORMAL HIGH (ref 0.61–1.24)
GFR, EST AFRICAN AMERICAN: 23 mL/min — AB (ref >60)
GFR, EST NON AFRICAN AMERICAN: 20 mL/min — AB (ref >60)
Glucose, Bld: 102 mg/dL — ABNORMAL HIGH (ref 65–99)
Potassium: 3.8 mmol/L (ref 3.5–5.1)
Sodium: 132 mmol/L — ABNORMAL LOW (ref 135–145)

## 2015-03-20 LAB — GLUCOSE, CAPILLARY: Glucose-Capillary: 93 mg/dL (ref 65–99)

## 2015-03-20 LAB — PROTIME-INR
INR: 2.91
PROTHROMBIN TIME: 29.9 s — AB (ref 11.4–15.0)

## 2015-03-20 MED ORDER — SODIUM CHLORIDE 0.9 % IV SOLN
INTRAVENOUS | Status: DC
  Administered 2015-03-20: 1000 mL via INTRAVENOUS
  Administered 2015-03-21 – 2015-03-22 (×2): via INTRAVENOUS

## 2015-03-20 NOTE — Consult Note (Signed)
ANTICOAGULATION CONSULT NOTE - Follow Up Consult  Pharmacy Consult for warfarin Indication: VTE prophylaxis  No Known Allergies  Patient Measurements: Height: 5\' 8"  (172.7 cm) Weight: 223 lb 4.8 oz (101.288 kg) IBW/kg (Calculated) : 68.4  Vital Signs: Temp: 97.9 F (36.6 C) (11/12 0638) Temp Source: Oral (11/12 21300638) BP: 99/59 mmHg (11/12 0638) Pulse Rate: 57 (11/12 0638)  Labs:  Recent Labs  03/18/15 0610 03/19/15 0501 03/20/15 0453  HGB 11.7* 11.9*  --   HCT 35.8* 36.0*  --   PLT 170 189  --   LABPROT 30.6* 29.3* 29.9*  INR 3.00 2.83 2.91  CREATININE 2.72* 2.76* 2.81*    Estimated Creatinine Clearance: 24.6 mL/min (by C-G formula based on Cr of 2.81).   Medications:  Scheduled:  . amiodarone  200 mg Oral Daily  . aspirin EC  81 mg Oral Daily  . atorvastatin  20 mg Oral QHS  . cholecalciferol  1,000 Units Oral Daily  . famotidine  20 mg Oral Daily  . furosemide  40 mg Intravenous Daily  . levothyroxine  150 mcg Oral QAC breakfast  . midodrine  5 mg Oral TID WC  . saccharomyces boulardii  250 mg Oral BID  . vancomycin  500 mg Oral 4 times per day  . warfarin  2.5 mg Oral q1800  . Warfarin - Pharmacist Dosing Inpatient   Does not apply q1800    Assessment: Pt is a 81 year old male who presents to the ED after a fall. Pt has had several days of diarrhea, following cdiff tx a few weeks ago. Pts scans are negative for a bleed due to his fall. INR on arrival is subtherapeutic at 1.68. Pt home dose is 3mg  daily. Pt is on warfarin for Afib.  Goal of Therapy:  INR 2-3 Monitor platelets by anticoagulation protocol: Yes   Plan:  Since INR is subtherapeutic will give 4mg  tonight. Pts current illness/potentially being restarted on antibiotics both have potential to raise INR. Will monitor closely. Will recheck INR in the AM.  11/6 INR 2.03. Restart home dose of 3 mg daily. INR in AM.  11/7 INR 2.59. Continue 3 mg daily. Daily INR ordered while pt is on  antibiotics.  11/8 INR 2.84. Continue 3mg  daily. Monitor INR daily.  11/9 INR 2.91 Continue 3mg  daily. INR trending up, likely to be supratherapeutic tomorrow and require dose reduction.  11/10 INR 3.00. Decrease dose to 2.5mg  daily, however expecting INR to be elevated tomorrow. Recheck INR in am.  11/11 INR 2.83. Continue with decreased dose of 2.5mg  daily. Will follow up on INR tomorrow.  11/12 INR: 2.81. Continue with warfarin 2.5 mg Po daily. Will follow up on INR tomorrow.  Continue to monitor daily.  Giordan Fordham D 03/20/2015,9:24 AM

## 2015-03-20 NOTE — Progress Notes (Signed)
Subjective:  Pt seen at bedside. Still a bit confused. Wife at bedside as well.  Not eating or drinking much. Remains on lasix. Renal function also worsening.   Objective:  Vital signs in last 24 hours:  Temp:  [97.9 F (36.6 C)-98.4 F (36.9 C)] 98.4 F (36.9 C) (11/12 1222) Pulse Rate:  [57-62] 59 (11/12 1222) Resp:  [16-20] 20 (11/12 1222) BP: (99-111)/(52-59) 111/52 mmHg (11/12 1222) SpO2:  [100 %] 100 % (11/12 1222) Weight:  [101.288 kg (223 lb 4.8 oz)] 101.288 kg (223 lb 4.8 oz) (11/12 0554)  Weight change: -0.862 kg (-1 lb 14.4 oz) Filed Weights   03/18/15 0542 03/19/15 0500 03/20/15 0554  Weight: 102.694 kg (226 lb 6.4 oz) 102.15 kg (225 lb 3.2 oz) 101.288 kg (223 lb 4.8 oz)    Intake/Output:    Intake/Output Summary (Last 24 hours) at 03/20/15 1601 Last data filed at 03/20/15 1228  Gross per 24 hour  Intake      0 ml  Output   1800 ml  Net  -1800 ml     Physical Exam: General: NAD  HEENT Anicteric, dry oral mucosal membranes  Neck supple  Pulm/lungs Normal effort, clear ant and laterally  CVS/Heart No rub or gallop  Abdomen:  Soft, distended, BS present  Extremities: ++ dependent edema  Neurologic: Lethargic, but arousable, slurred speech at times  Skin: Left olecrenon laceration, in dressings   GU Foley catheter       Basic Metabolic Panel:   Recent Labs Lab 03/16/15 0523 03/17/15 0524 03/18/15 0610 03/19/15 0501 03/20/15 0453  NA 128* 131* 129* 131* 132*  K 3.9 4.1 4.1 4.5 3.8  CL 100* 103 98* 98* 99*  CO2 21* 23 23 24 23   GLUCOSE 102* 102* 106* 95 102*  BUN 82* 77* 74* 77* 84*  CREATININE 3.02* 2.89* 2.72* 2.76* 2.81*  CALCIUM 8.9 9.1 8.9 9.2 9.4     CBC:  Recent Labs Lab 03/14/15 0339 03/16/15 0523 03/17/15 0524 03/18/15 0610 03/19/15 0501  WBC 13.4* 10.8* 8.8 7.8 9.2  HGB 12.2* 11.3* 11.5* 11.7* 11.9*  HCT 36.6* 33.9* 35.2* 35.8* 36.0*  MCV 101.9* 102.4* 102.5* 103.3* 102.3*  PLT 129* 134* 146* 170 189       Microbiology:  Recent Results (from the past 720 hour(s))  C difficile quick scan w PCR reflex     Status: Abnormal   Collection Time: 02/19/15  6:49 PM  Result Value Ref Range Status   C Diff antigen POSITIVE (A) NEGATIVE Final   C Diff toxin POSITIVE (A) NEGATIVE Final   C Diff interpretation   Final    Positive for toxigenic C. difficile, active toxin production present.    Comment: CRITICAL RESULT CALLED TO, READ BACK BY AND VERIFIED WITH: [facility] ERDESKY AT 2054 02/19/15.PMH   C difficile quick scan w PCR reflex     Status: Abnormal   Collection Time: 03/13/15  4:58 PM  Result Value Ref Range Status   C Diff antigen POSITIVE (A) NEGATIVE Final   C Diff toxin POSITIVE (A) NEGATIVE Final   C Diff interpretation   Final    Positive for toxigenic C. difficile, active toxin production present.    Comment: CRITICAL RESULT CALLED TO, READ BACK BY AND VERIFIED WITH: [facility] MOFFITT RN AT 1830 03/13/2015   Urine culture     Status: None   Collection Time: 03/14/15  6:35 PM  Result Value Ref Range Status   Specimen Description URINE, RANDOM  Final  Special Requests NONE  Final   Culture NO GROWTH 1 DAY  Final   Report Status 03/16/2015 FINAL  Final  Stool culture     Status: None   Collection Time: 03/15/15  1:49 AM  Result Value Ref Range Status   Specimen Description STOOL  Final   Special Requests NONE  Final   Culture   Final    NO CAMPYLOBACTER DETECTED NO SALMONELLA OR SHIGELLA ISOLATED No Pathogenic E. coli detected    Report Status 03/18/2015 FINAL  Final    Coagulation Studies:  Recent Labs  03/18/15 0610 03/19/15 0501 03/20/15 0453  LABPROT 30.6* 29.3* 29.9*  INR 3.00 2.83 2.91    Urinalysis:  Recent Labs  03/20/15 1226  COLORURINE YELLOW*  LABSPEC 1.009  PHURINE 5.0  GLUCOSEU NEGATIVE  HGBUR 2+*  BILIRUBINUR NEGATIVE  KETONESUR NEGATIVE  PROTEINUR NEGATIVE  NITRITE NEGATIVE  LEUKOCYTESUR NEGATIVE      Imaging: Ct Head Wo  Contrast  03/20/2015  CLINICAL DATA:  Confusion. EXAM: CT HEAD WITHOUT CONTRAST TECHNIQUE: Contiguous axial images were obtained from the base of the skull through the vertex without intravenous contrast. COMPARISON:  03/13/2015 FINDINGS: Ventricles are normal in configuration. There is ventricular and sulcal enlargement reflecting mild to moderate atrophy. There are no parenchymal masses or mass effect. There are patchy areas of white matter hypoattenuation consistent with moderate chronic microvascular ischemic change. There is no evidence of a cortical infarct. There are no extra-axial masses or abnormal fluid collections. Small amount of fat along the anterior falx cerebri, stable. No intracranial hemorrhage. Visualized sinuses and mastoid air cells are clear. IMPRESSION: 1. No acute intracranial abnormalities. Stable appearance from the recent prior exam. Electronically Signed   By: Amie Portland M.D.   On: 03/20/2015 13:13   Dg Abd 2 Views  03/19/2015  CLINICAL DATA:  81 year old male with abdominal pain, distention, and diarrhea after fall. Initial encounter. EXAM: ABDOMEN - 2 VIEW COMPARISON:  Portable chest radiograph 01/16/2015. FINDINGS: Upright and supine views of the abdomen and pelvis. No pneumoperitoneum. Curvilinear opacity at the left lung base most resembles atelectasis. Stable visualized mediastinal contours. Previous CABG. Redundant colon with widespread large bowel gaseous distension. Retained stool in the right abdomen. Gas-filled but nondilated small bowel loops throughout the visible abdomen. The large bowel gas continues into the pelvis. Cholecystectomy clips. Left inguinal surgical clips. Pelvic phleboliths. No acute osseous abnormality identified. IMPRESSION: 1. Gas throughout small and redundant large bowel loops most suggestive of diffuse ileus. 2. No abdominal free air. 3. Left lower lobe atelectasis. Electronically Signed   By: Odessa Fleming M.D.   On: 03/19/2015 08:36      Medications:     . amiodarone  200 mg Oral Daily  . aspirin EC  81 mg Oral Daily  . atorvastatin  20 mg Oral QHS  . cholecalciferol  1,000 Units Oral Daily  . famotidine  20 mg Oral Daily  . furosemide  40 mg Intravenous Daily  . levothyroxine  150 mcg Oral QAC breakfast  . midodrine  5 mg Oral TID WC  . saccharomyces boulardii  250 mg Oral BID  . vancomycin  500 mg Oral 4 times per day  . warfarin  2.5 mg Oral q1800  . Warfarin - Pharmacist Dosing Inpatient   Does not apply q1800   acetaminophen **OR** acetaminophen  Assessment/ Plan:  81 y.o. white male with congestive heart failure, atrial fibrillation, chronic anticoagulation, hypothyroidism, BPH, hyperlipidemia, hypertension, coronary artery disease status post CABG,Liver cirrhosis  who was admitted to [facility] for C. Diff colitis.   1. Acute kidney failure on chronic kidney disease stage IV. Baseline 2.78/GFR 20. Followed by [facility] as an outpatient.  Will d/c lasix as he's not eating or drinking very well.  Will actually start on gentle IVF hydration with 0.9 NS for now.  Watch for volume overload closely however.  2. Hyponatremia: hypervolemic hypo-osmotic hyponatremia. Consistent with volume overload from hepatic cirrhosis and congestive heart failure systolic with EF of 30-35% (echo 01/17/15) - complicated picture, has signs of volume overload but currently not eating or drinking well, will actually hold lasix for now given worsening renal function and will give gentle hydration as above.   3. Acute CHF and Edema: with history of hepatic cirrhosis and systolic congestive heart failure EF 30-35% 01/17/15 - holding lasix for now given worsening renal function.   4. Hypotension: blood pressure at goal - continue midodrine.   5. Infective Colitis: C. Diff A09 - on metronidazole and vancomycin.  - stool transplant being considered.      LOS: 7 Blake White 11/12/20164:01 PM

## 2015-03-20 NOTE — Progress Notes (Signed)
[facility] [place] [facility]   PATIENT NAME: Blake MangesBobby White    MR#:  [id number]  DATE OF BIRTH:  12/12/1933  SUBJECTIVE: Seen today, received suppository last night secondary to constipation. Wife  Reports that he started talking out of his head and confused. myoclinci jerks observed.   CHIEF COMPLAINT:   Chief Complaint  Patient presents with  . Fall  . Weakness  . Diarrhea   Sleepy this morning. No new complaints  REVIEW OF SYSTEMS:   Review of Systems  Constitutional: Positive for malaise/fatigue. Negative for fever.  Respiratory: Negative for shortness of breath.   Cardiovascular: Negative for chest pain and palpitations.  Gastrointestinal: Positive for abdominal pain and diarrhea. Negative for nausea, vomiting and blood in stool.  Genitourinary: Negative for dysuria.  Neurological: Positive for weakness.    DRUG ALLERGIES:  No Known Allergies  VITALS:  Blood pressure 99/59, pulse 57, temperature 97.9 F (36.6 C), temperature source Oral, resp. rate 18, height 5\' 8"  (1.727 m), weight 101.288 kg (223 lb 4.8 oz), SpO2 100 %.  PHYSICAL EXAMINATION:  GENERAL:  81 y.o.-year-old patient lying in the bed, no distress LUNGS: Normal breath sounds bilaterally, no wheezing, rales,rhonchi or crepitation. No use of accessory muscles of respiration.  CARDIOVASCULAR: S1, S2 normal. No murmurs, rubs, or gallops.  ABDOMEN: Soft, nontender, distended, tympanic. Bowel sounds present. No organomegaly or mass.  EXTREMITIES: Bilateral pedal edema present. Mild better today. NEUROLOGIC: Cranial nerves II through XII are intact. Muscle strength 5/5 in all extremities. Sensation intact. Gait not checked.  PSYCHIATRIC: Sleepy, oriented, calm  SKIN: Large skin tear over left forearm   LABORATORY PANEL:   CBC  Recent Labs Lab 03/19/15 0501  WBC 9.2  HGB 11.9*  HCT 36.0*  PLT 189    ------------------------------------------------------------------------------------------------------------------  Chemistries   Recent Labs Lab 03/17/15 0524  03/20/15 0453  NA 131*  < > 132*  K 4.1  < > 3.8  CL 103  < > 99*  CO2 23  < > 23  GLUCOSE 102*  < > 102*  BUN 77*  < > 84*  CREATININE 2.89*  < > 2.81*  CALCIUM 9.1  < > 9.4  AST 38  --   --   ALT 27  --   --   ALKPHOS 70  --   --   BILITOT 0.5  --   --   < > = values in this interval not displayed. ------------------------------------------------------------------------------------------------------------------  Cardiac Enzymes  Recent Labs Lab 03/13/15 1658  TROPONINI 0.04*   ------------------------------------------------------------------------------------------------------------------  RADIOLOGY:  Dg Chest Port 1 View  03/18/2015  CLINICAL DATA:  Shortness of breath with history of kidney failure. EXAM: PORTABLE CHEST 1 VIEW COMPARISON:  03/13/2015 FINDINGS: Sternotomy wires are intact. Lordotic technique is demonstrated as the lungs are adequately inflated with minimal linear density of the left base likely atelectasis and possible small amount of left pleural fluid. Stable cardiomegaly. Remainder of the exam is unchanged. IMPRESSION: Stable left base opacification likely small effusion with atelectasis. Mild stable cardiomegaly. Electronically Signed   By: Elberta Fortisaniel  Boyle M.D.   On: 03/18/2015 15:21   Dg Abd 2 Views  03/19/2015  CLINICAL DATA:  81 year old male with abdominal pain, distention, and diarrhea after fall. Initial encounter. EXAM: ABDOMEN - 2 VIEW COMPARISON:  Portable chest radiograph 01/16/2015. FINDINGS: Upright and supine views of the abdomen and pelvis. No pneumoperitoneum. Curvilinear opacity at the left lung base most resembles atelectasis. Stable visualized mediastinal contours. Previous  CABG. Redundant colon with widespread large bowel gaseous distension. Retained stool in the right  abdomen. Gas-filled but nondilated small bowel loops throughout the visible abdomen. The large bowel gas continues into the pelvis. Cholecystectomy clips. Left inguinal surgical clips. Pelvic phleboliths. No acute osseous abnormality identified. IMPRESSION: 1. Gas throughout small and redundant large bowel loops most suggestive of diffuse ileus. 2. No abdominal free air. 3. Left lower lobe atelectasis. Electronically Signed   By: Odessa Fleming M.D.   On: 03/19/2015 08:36    EKG:   Orders placed or performed during the hospital encounter of 03/13/15  . ED EKG  . ED EKG  . EKG 12-Lead  . EKG 12-Lead    ASSESSMENT AND PLAN:   Blake White is a 81 y.o. male with a known history of chronic atrial fibrillation, on Coumadin, congestive heart failure, hypothyroidism, chronic liver cirrhosis and recurrent C. difficile colitis was just admitted to the hospital and treated with by mouth vancomycin for C. difficile colitis. Patient finished his antibiotic course just 1 week ago but still having ongoing diarrhea. For the past 2 days his diarrhea has been worse and copious. Patient was feeling weak and lightheaded and sustained a fall but did not pass out. According to family members patient is slightly confused and not being himself  1) recurrent Clostridium difficile colitis - Appreciate GI consultation and consideration for fecal transplant - on high dose oral vanc,flagyl. - Will need to complete 2 weeks of vancomycin prior to fecal transplant. Stop date will be 11/20 Continue by mouth vancomycin Ileus by  abdominal x-ray yesterday. Patient received stool suppository yesterday.  #2 acute on chronic kidney disease stage 4 - Due to ATN volume depletion, improving - Continue to avoid nephrotoxins - appreciate nephrology following Worsening renal functions;   #3 congestive heart failure: Exacerbation ; pedal edema: Started on IV Lasix today. - will now restart Lasix, continue amiodarone - Continue daily  weights I's and O's  #4 atrial fibrillation, chronic - Slight bradycardia ;decreased dose of amiodarone yesterday.- Continue Coumadin per pharmacy, INR today is 2.9  5. Hypothyroidism - Continue Synthyroid  6. Generalized weakness with near syncope - PT eval pending, SNF placement needed, wife is reviewing bed offers  7. Recent history of acute esophagitis - PPI  8. Left hip pain - no fracture on xray  9. Left forearm skin tear - WOC  10 dysuria - UA negative for infection or hematuria 11.Hyponatremia: hypervolemic hypo-osmotic hyponatremia. Consistent with volume overload from hepatic cirrhosis and congestive heart failure systolic with EF of 30-35% (echo 01/17/15) - restarted  furosemide  IV - Continue to hold metolazone. Hyponatremia improved. #12. Confusion: Head CT today. And also check the urine for infection. All the records are reviewed and case discussed with Care Management/Social Workerr. Management plans discussed with the patient, family and they are in agreement.  CODE STATUS: full  TOTAL TIME TAKING CARE OF THIS PATIENT: 25 minutes.   POSSIBLE D/C IN 2-3 DAYS, DEPENDING ON CLINICAL CONDITION.   Katha Hamming M.D on 03/20/2015 at 11:40 AM  Between 7am to 6pm - Pager - [phone]  After 6pm go to [email or web address] - password [email or web address] [facility]  [facility]  Office  [phone]  CC: Primary care physician; Sula Rumple, MD

## 2015-03-20 NOTE — Consult Note (Signed)
CC: confusion   HPI: Blake White is an 81 y.o. male with a known history of chronic atrial fibrillation, on Coumadin, congestive heart failure, hypothyroidism, chronic liver cirrhosis and recurrent C. difficile colitis was just admitted to the hospital and treated with by mouth vancomycin for C. difficile colitis. Presented with worsening diarrhea. Pt appears to have continued C. Diff infectinon.   Past Medical History  Diagnosis Date  . CHF (congestive heart failure) (HCC)   . BPH (benign prostatic hyperplasia)   . A-fib (HCC)   . Thyroid disease   . High cholesterol   . Bilateral lower extremity edema   . CAD (coronary artery disease)   . Cirrhosis (HCC)   . CVA (cerebral vascular accident) (HCC)   . Gout   . Hypothyroidism   . Myocardial infarction (HCC)   . Obesity     Past Surgical History  Procedure Laterality Date  . Vein bypass surgery    . Vascular surgery    . Cardiac surgery      quad bypass  . Esophagogastroduodenoscopy (egd) with propofol [name] 03/04/2015    Procedure: ESOPHAGOGASTRODUODENOSCOPY (EGD) WITH PROPOFOL;  Surgeon: Elnita Maxwell, MD;  Location: [facility] ENDOSCOPY;  Service: Endoscopy;  Laterality: [name];    Family History  Problem Relation Age of Onset  . Diabetes Mellitus II Mother   . Lung cancer Brother     Social History:  reports that he has quit smoking. He has never used smokeless tobacco. He reports that he does not drink alcohol or use illicit drugs.  No Known Allergies  Medications: I have reviewed the patient's current medications.  ROS: Not able to obtain due to confusion   Physical Examination: Blood pressure 111/52, pulse 59, temperature 98.4 F (36.9 C), temperature source Oral, resp. rate 20, height  (1.727 m), weight 223 lb 4.8 oz (101.288 kg), SpO2 100 %.    Neurological Examination Mental Status: Alert, oriented, thought content appropriate.  Speech fluent without evidence of aphasia.  Able to follow 3 step  commands without difficulty. Cranial Nerves: II: Discs flat bilaterally; Visual fields grossly normal, pupils equal, round, reactive to light and accommodation III,IV, VI: ptosis not present, extra-ocular motions intact bilaterally V,VII: smile symmetric, facial light touch sensation normal bilaterally VIII: hearing normal bilaterally IX,X: gag reflex present XI: bilateral shoulder shrug XII: midline tongue extension Motor: Right : Upper extremity   4+/5    Left:     Upper extremity   4+/5  Lower extremity   4/5     Lower extremity   4/5 Tone and bulk:normal tone throughout; no atrophy noted Sensory: Pinprick and light touch intact throughout, bilaterally Deep Tendon Reflexes: 1+ and symmetric throughout Plantars: Right: downgoing   Left: downgoing Cerebellar: normal finger-to-nose, normal rapid alternating movements and normal heel-to-shin test Gait: not tested      Laboratory Studies:   Basic Metabolic Panel:  Recent Labs Lab 03/16/15 0523 03/17/15 0524 03/18/15 0610 03/19/15 0501 03/20/15 0453  NA 128* 131* 129* 131* 132*  K 3.9 4.1 4.1 4.5 3.8  CL 100* 103 98* 98* 99*  CO2 21* GLUCOSE 102* 102* 106* 95 102*  BUN 82* 77* 74* 77* 84*  CREATININE 3.02* 2.89* 2.72* 2.76* 2.81*  CALCIUM 8.9 9.1 8.9 9.2 9.4    Liver Function Tests:  Recent Labs Lab 03/14/15 0339 03/17/15 0524  AST 71* 38  ALT 45 27  ALKPHOS 76 70  BILITOT 2.0* 0.5  PROT 5.9*  5.3*  ALBUMIN 2.7* 2.2*   No results for input(s): LIPASE, AMYLASE in the last 168 hours. No results for input(s): AMMONIA in the last 168 hours.  CBC:  Recent Labs Lab 03/14/15 0339 03/16/15 0523 03/17/15 0524 03/18/15 0610 03/19/15 0501  WBC 13.4* 10.8* 8.8 7.8 9.2  HGB 12.2* 11.3* 11.5* 11.7* 11.9*  HCT 36.6* 33.9* 35.2* 35.8* 36.0*  MCV 101.9* 102.4* 102.5* 103.3* 102.3*  PLT 129* 134* 146* 170 189    Cardiac Enzymes: No results for input(s): CKTOTAL, CKMB, CKMBINDEX, TROPONINI in the  last 168 hours.  BNP: Invalid input(s): POCBNP  CBG: No results for input(s): GLUCAP in the last 168 hours.  Microbiology: Results for orders placed or performed during the hospital encounter of 03/13/15  C difficile quick scan w PCR reflex     Status: Abnormal   Collection Time: 03/13/15  4:58 PM  Result Value Ref Range Status   C Diff antigen POSITIVE (A) NEGATIVE Final   C Diff toxin POSITIVE (A) NEGATIVE Final   C Diff interpretation   Final    Positive for toxigenic C. difficile, active toxin production present.    Comment: CRITICAL RESULT CALLED TO, READ BACK BY AND VERIFIED WITH: [facility] MOFFITT RN AT 1830 03/13/2015   Urine culture     Status: None   Collection Time: 03/14/15  6:35 PM  Result Value Ref Range Status   Specimen Description URINE, RANDOM  Final   Special Requests NONE  Final   Culture NO GROWTH 1 DAY  Final   Report Status 03/16/2015 FINAL  Final  Stool culture     Status: None   Collection Time: 03/15/15  1:49 AM  Result Value Ref Range Status   Specimen Description STOOL  Final   Special Requests NONE  Final   Culture   Final    NO CAMPYLOBACTER DETECTED NO SALMONELLA OR SHIGELLA ISOLATED No Pathogenic E. coli detected    Report Status 03/18/2015 FINAL  Final    Coagulation Studies:  Recent Labs  03/18/15 0610 03/19/15 0501 03/20/15 0453  LABPROT 30.6* 29.3* 29.9*  INR 3.00 2.83 2.91    Urinalysis:  Recent Labs Lab 03/14/15 1835 03/20/15 1226  COLORURINE YELLOW* YELLOW*  LABSPEC 1.013 1.009  PHURINE 5.0 5.0  GLUCOSEU NEGATIVE NEGATIVE  HGBUR NEGATIVE 2+*  BILIRUBINUR NEGATIVE NEGATIVE  KETONESUR NEGATIVE NEGATIVE  PROTEINUR NEGATIVE NEGATIVE  NITRITE NEGATIVE NEGATIVE  LEUKOCYTESUR NEGATIVE NEGATIVE    Lipid Panel:  No results found for: CHOL, TRIG, HDL, CHOLHDL, VLDL, LDLCALC  HgbA1C:  Lab Results  Component Value Date   HGBA1C 5.4 01/16/2015    Urine Drug Screen:  No results found for: LABOPIA, COCAINSCRNUR,  LABBENZ, AMPHETMU, THCU, LABBARB  Alcohol Level: No results for input(s): ETH in the last 168 hours.    Imaging: Ct Head Wo Contrast  03/20/2015  CLINICAL DATA:  Confusion. EXAM: CT HEAD WITHOUT CONTRAST TECHNIQUE: Contiguous axial images were obtained from the base of the skull through the vertex without intravenous contrast. COMPARISON:  03/13/2015 FINDINGS: Ventricles are normal in configuration. There is ventricular and sulcal enlargement reflecting mild to moderate atrophy. There are no parenchymal masses or mass effect. There are patchy areas of white matter hypoattenuation consistent with moderate chronic microvascular ischemic change. There is no evidence of a cortical infarct. There are no extra-axial masses or abnormal fluid collections. Small amount of fat along the anterior falx cerebri, stable. No intracranial hemorrhage. Visualized sinuses and mastoid air cells are clear. IMPRESSION: 1. No acute  intracranial abnormalities. Stable appearance from the recent prior exam. Electronically Signed   By: Amie Portlandavid  Ormond M.D.   On: 03/20/2015 13:13   Dg Abd 2 Views  03/19/2015  CLINICAL DATA:  81 year old male with abdominal pain, distention, and diarrhea after fall. Initial encounter. EXAM: ABDOMEN - 2 VIEW COMPARISON:  Portable chest radiograph 01/16/2015. FINDINGS: Upright and supine views of the abdomen and pelvis. No pneumoperitoneum. Curvilinear opacity at the left lung base most resembles atelectasis. Stable visualized mediastinal contours. Previous CABG. Redundant colon with widespread large bowel gaseous distension. Retained stool in the right abdomen. Gas-filled but nondilated small bowel loops throughout the visible abdomen. The large bowel gas continues into the pelvis. Cholecystectomy clips. Left inguinal surgical clips. Pelvic phleboliths. No acute osseous abnormality identified. IMPRESSION: 1. Gas throughout small and redundant large bowel loops most suggestive of diffuse ileus. 2. No  abdominal free air. 3. Left lower lobe atelectasis. Electronically Signed   By: Odessa FlemingH  Hall M.D.   On: 03/19/2015 08:36     Assessment/Plan:  81 y.o. male with a known history of chronic atrial fibrillation, on Coumadin, congestive heart failure, hypothyroidism, chronic liver cirrhosis and recurrent C. difficile colitis was just admitted to the hospital and treated with by mouth vancomycin for C. difficile colitis. Presented with worsening diarrhea. Pt appears to have continued C. Diff infectinon.   Mental status is likely metabolic in nature due to renal impairment, C-diff, hyponatremia No further imaging from neuro stand point.  Pauletta BrownsZEYLIKMAN, Jhoanna Heyde  03/20/2015, 6:04 PM

## 2015-03-20 NOTE — Clinical Social Work Note (Signed)
CSW followed up with patient and wife this afternoon and they have chosen the bed at [facility]. CSW has sent notification to [facility] via epic.  York SpanielMonica Leshae Mcclay MSW,LCSW [phone]

## 2015-03-20 NOTE — Consult Note (Signed)
  GI Inpatient Follow-up Note  Patient Identification: Archer AsaBobby Y Napoli is a 81 y.o. male with recurrent c.diff. Covering for Dr. Mechele CollinElliott.   Subjective: Mildly confused. Awaiting neurology input. Did have a BM this AM. Eating solids. Scheduled Inpatient Medications:  . amiodarone  200 mg Oral Daily  . aspirin EC  81 mg Oral Daily  . atorvastatin  20 mg Oral QHS  . cholecalciferol  1,000 Units Oral Daily  . famotidine  20 mg Oral Daily  . furosemide  40 mg Intravenous Daily  . levothyroxine  150 mcg Oral QAC breakfast  . midodrine  5 mg Oral TID WC  . saccharomyces boulardii  250 mg Oral BID  . vancomycin  500 mg Oral 4 times per day  . warfarin  2.5 mg Oral q1800  . Warfarin - Pharmacist Dosing Inpatient   Does not apply q1800    Continuous Inpatient Infusions:     PRN Inpatient Medications:  acetaminophen **OR** acetaminophen, oxyCODONE  Review of Systems: Constitutional: Weight is stable.  Eyes: No changes in vision. ENT: No oral lesions, sore throat.  GI: see HPI.  Heme/Lymph: No easy bruising.  CV: No chest pain.  GU: No hematuria.  Integumentary: No rashes.  Neuro: No headaches.  Psych: No depression/anxiety.  Endocrine: No heat/cold intolerance.  Allergic/Immunologic: No urticaria.  Resp: No cough, SOB.  Musculoskeletal: No joint swelling.    Physical Examination: BP 99/59 mmHg  Pulse 57  Temp(Src) 97.9 F (36.6 C) (Oral)  Resp 18  Ht 5\' 8"  (1.727 m)  Wt 101.288 kg (223 lb 4.8 oz)  BMI 33.96 kg/m2  SpO2 100% Gen: NAD, alert and oriented x 4 HEENT: PEERLA, EOMI, Neck: supple, no JVD or thyromegaly Chest: CTA bilaterally, no wheezes, crackles, or other adventitious sounds CV: RRR, no m/g/c/r Abd: soft, distended with decreased BS in all four quadrants; no HSM, guarding, ridigity, or rebound tenderness Ext: no edema, well perfused with 2+ pulses, Skin: no rash or lesions noted Lymph: no LAD  Data: Lab Results  Component Value Date   WBC 9.2  03/19/2015   HGB 11.9* 03/19/2015   HCT 36.0* 03/19/2015   MCV 102.3* 03/19/2015   PLT 189 03/19/2015    Recent Labs Lab 03/17/15 0524 03/18/15 0610 03/19/15 0501  HGB 11.5* 11.7* 11.9*   Lab Results  Component Value Date   NA 132* 03/20/2015   K 3.8 03/20/2015   CL 99* 03/20/2015   CO2 23 03/20/2015   BUN 84* 03/20/2015   CREATININE 2.81* 03/20/2015   Lab Results  Component Value Date   ALT 27 03/17/2015   AST 38 03/17/2015   ALKPHOS 70 03/17/2015   BILITOT 0.5 03/17/2015    Recent Labs Lab 03/20/15 0453  INR 2.91   Assessment/Plan: Mr. Tanya Nonesickard is a 81 y.o. male with recurrent c.diff and ileus.  Recommendations: Continue po vanco. Would repeat abdominal x- ray tomorrow to see if ileus improving. Thanks. Please call with questions or concerns.  Bo Teicher, Ezzard StandingPAUL Y, MD

## 2015-03-21 ENCOUNTER — Inpatient Hospital Stay: Payer: Medicare Other

## 2015-03-21 ENCOUNTER — Encounter: Payer: Self-pay | Admitting: Gastroenterology

## 2015-03-21 LAB — BASIC METABOLIC PANEL
Anion gap: 7 (ref 5–15)
BUN: 90 mg/dL — ABNORMAL HIGH (ref 6–20)
CHLORIDE: 100 mmol/L — AB (ref 101–111)
CO2: 24 mmol/L (ref 22–32)
CREATININE: 2.67 mg/dL — AB (ref 0.61–1.24)
Calcium: 9.4 mg/dL (ref 8.9–10.3)
GFR, EST AFRICAN AMERICAN: 25 mL/min — AB (ref >60)
GFR, EST NON AFRICAN AMERICAN: 21 mL/min — AB (ref >60)
Glucose, Bld: 103 mg/dL — ABNORMAL HIGH (ref 65–99)
Potassium: 4 mmol/L (ref 3.5–5.1)
SODIUM: 131 mmol/L — AB (ref 135–145)

## 2015-03-21 LAB — CBC
HCT: 36 % — ABNORMAL LOW (ref 40.0–52.0)
HEMOGLOBIN: 11.9 g/dL — AB (ref 13.0–18.0)
MCH: 33.9 pg (ref 26.0–34.0)
MCHC: 33.2 g/dL (ref 32.0–36.0)
MCV: 102.2 fL — ABNORMAL HIGH (ref 80.0–100.0)
Platelets: 177 10*3/uL (ref 150–440)
RBC: 3.52 MIL/uL — AB (ref 4.40–5.90)
RDW: 16.1 % — ABNORMAL HIGH (ref 11.5–14.5)
WBC: 8.5 10*3/uL (ref 3.8–10.6)

## 2015-03-21 LAB — PROTIME-INR
INR: 2.96
PROTHROMBIN TIME: 30.3 s — AB (ref 11.4–15.0)

## 2015-03-21 NOTE — Progress Notes (Signed)
Subjective:  Pt seen at bedside.  Good UOP noted. However BUN/Cr remain quite high. BUN still high at 90, though Cr slightly better at 2.67.   Objective:  Vital signs in last 24 hours:  Temp:  [97.8 F (36.6 C)-98.4 F (36.9 C)] 97.8 F (36.6 C) (11/13 0508) Pulse Rate:  [52-59] 52 (11/13 0508) Resp:  [16-20] 16 (11/13 0508) BP: (111-116)/(52-60) 116/58 mmHg (11/13 0508) SpO2:  [100 %] 100 % (11/13 0508) Weight:  [100.426 kg (221 lb 6.4 oz)] 100.426 kg (221 lb 6.4 oz) (11/13 0500)  Weight change: -0.862 kg (-1 lb 14.4 oz) Filed Weights   03/19/15 0500 03/20/15 0554 03/21/15 0500  Weight: 102.15 kg (225 lb 3.2 oz) 101.288 kg (223 lb 4.8 oz) 100.426 kg (221 lb 6.4 oz)    Intake/Output:    Intake/Output Summary (Last 24 hours) at 03/21/15 1149 Last data filed at 03/21/15 0800  Gross per 24 hour  Intake 899.67 ml  Output   1450 ml  Net -550.33 ml     Physical Exam: General: NAD  HEENT Anicteric, dry oral mucosal membranes  Neck supple  Pulm/lungs Normal effort, clear ant and laterally  CVS/Heart S1S2 no rubs  Abdomen:  Soft, distended, BS present  Extremities: ++ dependent edema  Neurologic: Awake, alert, will follow commands  Skin: Left olecrenon laceration, in dressings   GU Foley catheter       Basic Metabolic Panel:   Recent Labs Lab 03/17/15 0524 03/18/15 0610 03/19/15 0501 03/20/15 0453 03/21/15 0740  NA 131* 129* 131* 132* 131*  K 4.1 4.1 4.5 3.8 4.0  CL 103 98* 98* 99* 100*  CO2 GLUCOSE 102* 106* 95 102* 103*  BUN 77* 74* 77* 84* 90*  CREATININE 2.89* 2.72* 2.76* 2.81* 2.67*  CALCIUM 9.1 8.9 9.2 9.4 9.4     CBC:  Recent Labs Lab 03/16/15 0523 03/17/15 0524 03/18/15 0610 03/19/15 0501 03/21/15 0740  WBC 10.8* 8.8 7.8 9.2 8.5  HGB 11.3* 11.5* 11.7* 11.9* 11.9*  HCT 33.9* 35.2* 35.8* 36.0* 36.0*  MCV 102.4* 102.5* 103.3* 102.3* 102.2*  PLT 134* 146* 170 189 177      Microbiology:  Recent Results (from the  past 720 hour(s))  C difficile quick scan w PCR reflex     Status: Abnormal   Collection Time: 02/19/15  6:49 PM  Result Value Ref Range Status   C Diff antigen POSITIVE (A) NEGATIVE Final   C Diff toxin POSITIVE (A) NEGATIVE Final   C Diff interpretation   Final    Positive for toxigenic C. difficile, active toxin production present.    Comment: CRITICAL RESULT CALLED TO, READ BACK BY AND VERIFIED WITH: [facility] ERDESKY AT 2054 02/19/15.PMH   C difficile quick scan w PCR reflex     Status: Abnormal   Collection Time: 03/13/15  4:58 PM  Result Value Ref Range Status   C Diff antigen POSITIVE (A) NEGATIVE Final   C Diff toxin POSITIVE (A) NEGATIVE Final   C Diff interpretation   Final    Positive for toxigenic C. difficile, active toxin production present.    Comment: CRITICAL RESULT CALLED TO, READ BACK BY AND VERIFIED WITH: [facility] MOFFITT RN AT 1830 03/13/2015   Urine culture     Status: None   Collection Time: 03/14/15  6:35 PM  Result Value Ref Range Status   Specimen Description URINE, RANDOM  Final   Special Requests NONE  Final   Culture NO  GROWTH 1 DAY  Final   Report Status 03/16/2015 FINAL  Final  Stool culture     Status: None   Collection Time: 03/15/15  1:49 AM  Result Value Ref Range Status   Specimen Description STOOL  Final   Special Requests NONE  Final   Culture   Final    NO CAMPYLOBACTER DETECTED NO SALMONELLA OR SHIGELLA ISOLATED No Pathogenic E. coli detected    Report Status 03/18/2015 FINAL  Final    Coagulation Studies:  Recent Labs  03/19/15 0501 03/20/15 0453 03/21/15 0740  LABPROT 29.3* 29.9* 30.3*  INR 2.83 2.91 2.96    Urinalysis:  Recent Labs  03/20/15 1226  COLORURINE YELLOW*  LABSPEC 1.009  PHURINE 5.0  GLUCOSEU NEGATIVE  HGBUR 2+*  BILIRUBINUR NEGATIVE  KETONESUR NEGATIVE  PROTEINUR NEGATIVE  NITRITE NEGATIVE  LEUKOCYTESUR NEGATIVE      Imaging: Ct Head Wo Contrast  03/20/2015  CLINICAL DATA:  Confusion. EXAM: CT  HEAD WITHOUT CONTRAST TECHNIQUE: Contiguous axial images were obtained from the base of the skull through the vertex without intravenous contrast. COMPARISON:  03/13/2015 FINDINGS: Ventricles are normal in configuration. There is ventricular and sulcal enlargement reflecting mild to moderate atrophy. There are no parenchymal masses or mass effect. There are patchy areas of white matter hypoattenuation consistent with moderate chronic microvascular ischemic change. There is no evidence of a cortical infarct. There are no extra-axial masses or abnormal fluid collections. Small amount of fat along the anterior falx cerebri, stable. No intracranial hemorrhage. Visualized sinuses and mastoid air cells are clear. IMPRESSION: 1. No acute intracranial abnormalities. Stable appearance from the recent prior exam. Electronically Signed   By: Amie Portlandavid  Ormond M.D.   On: 03/20/2015 13:13     Medications:   . sodium chloride 1,000 mL (03/20/15 1703)   . amiodarone  200 mg Oral Daily  . aspirin EC  81 mg Oral Daily  . atorvastatin  20 mg Oral QHS  . cholecalciferol  1,000 Units Oral Daily  . famotidine  20 mg Oral Daily  . levothyroxine  150 mcg Oral QAC breakfast  . midodrine  5 mg Oral TID WC  . saccharomyces boulardii  250 mg Oral BID  . vancomycin  500 mg Oral 4 times per day  . warfarin  2.5 mg Oral q1800  . Warfarin - Pharmacist Dosing Inpatient   Does not apply q1800   acetaminophen **OR** acetaminophen  Assessment/ Plan:  81 y.o. white male with congestive heart failure, atrial fibrillation, chronic anticoagulation, hypothyroidism, BPH, hyperlipidemia, hypertension, coronary artery disease status post CABG, Liver cirrhosis who [facility]. Diff colitis.   1. Acute kidney failure on chronic kidney disease stage IV. Baseline 2.78/GFR 20. [facility].  Cr appears to be at his baseline, however BUN still high likely due to catabolic state and diuretics.    Continue IVF hydration for right now.  2. Hyponatremia: hypervolemic hypo-osmotic hyponatremia. Originally had volume overload from hepatic cirrhosis and congestive heart failure systolic with EF of 30-35% (echo 01/17/15) - Na stable at 131, not likely to rise much above this given liver disease.  3. Acute CHF and Edema: with history of hepatic cirrhosis and systolic congestive heart failure EF 30-35% 01/17/15 - holding lasix for now given worsening renal function.   4. Hypotension: continue midodrine.   5. Infective Colitis: C. Diff A09 - on metronidazole and vancomycin.  - stool transplant being considered.      LOS:  8 Bruk [place] 11/13/201611:49 AM

## 2015-03-21 NOTE — Consult Note (Signed)
ANTICOAGULATION CONSULT NOTE - Follow Up Consult  Pharmacy Consult for warfarin Indication: VTE prophylaxis  No Known Allergies  Patient Measurements: Height: 5\' 8"  (172.7 cm) Weight: 221 lb 6.4 oz (100.426 kg) IBW/kg (Calculated) : 68.4  Vital Signs: Temp: 97.8 F (36.6 C) (11/13 0508) Temp Source: Oral (11/13 0508) BP: 116/58 mmHg (11/13 0508) Pulse Rate: 52 (11/13 0508)  Labs:  Recent Labs  03/19/15 0501 03/20/15 0453 03/21/15 0740  HGB 11.9*  --  11.9*  HCT 36.0*  --  36.0*  PLT 189  --  177  LABPROT 29.3* 29.9* 30.3*  INR 2.83 2.91 2.96  CREATININE 2.76* 2.81* 2.67*    Estimated Creatinine Clearance: 25.8 mL/min (by C-G formula based on Cr of 2.67).   Medications:  Scheduled:  . amiodarone  200 mg Oral Daily  . aspirin EC  81 mg Oral Daily  . atorvastatin  20 mg Oral QHS  . cholecalciferol  1,000 Units Oral Daily  . famotidine  20 mg Oral Daily  . levothyroxine  150 mcg Oral QAC breakfast  . midodrine  5 mg Oral TID WC  . saccharomyces boulardii  250 mg Oral BID  . vancomycin  500 mg Oral 4 times per day  . warfarin  2.5 mg Oral q1800  . Warfarin - Pharmacist Dosing Inpatient   Does not apply q1800    Assessment: Pt is a 81 year old male who presents to the ED after a fall. Pt has had several days of diarrhea, following cdiff tx a few weeks ago. Pts scans are negative for a bleed due to his fall. INR on arrival is subtherapeutic at 1.68. Pt home dose is 3mg  daily. Pt is on warfarin for Afib.  Goal of Therapy:  INR 2-3 Monitor platelets by anticoagulation protocol: Yes   Plan:  Since INR is subtherapeutic will give 4mg  tonight. Pts current illness/potentially being restarted on antibiotics both have potential to raise INR. Will monitor closely. Will recheck INR in the AM.  11/6 INR 2.03. Restart home dose of 3 mg daily. INR in AM.  11/7 INR 2.59. Continue 3 mg daily. Daily INR ordered while pt is on antibiotics.  11/8 INR 2.84. Continue 3mg  daily.  Monitor INR daily.  11/9 INR 2.91 Continue 3mg  daily. INR trending up, likely to be supratherapeutic tomorrow and require dose reduction.  11/10 INR 3.00. Decrease dose to 2.5mg  daily, however expecting INR to be elevated tomorrow. Recheck INR in am.  11/11 INR 2.83. Continue with decreased dose of 2.5mg  daily. Will follow up on INR tomorrow.  11/12 INR: 2.81. Continue with warfarin 2.5 mg Po daily. Will follow up on INR tomorrow.  Continue to monitor daily..  11/13: INR: 2.96. Continue warfarin 2.5 mg PO daily. Will follow up on INR tomorrow.   Montey Ebel D 03/21/2015,9:50 AM

## 2015-03-21 NOTE — Progress Notes (Signed)
[facility] [place] at  alert, awake, oriented. Eating the lunch by himself. However her mental status is improved.AAlert, awake, oriented. Alert, awake, orientedla All of abdomen x-ray tomorrow. abdomin awakeawake,[facility]   PATIENT NAME: Blake MangesBobby White    MR#:  [id number]  DATE OF BIRTH:  11/25/1933  SUBJECTIVE: sent a seen today. He is alert,awake,oriented.family says they noticed big change in him since started on ov fluids.had one loose stool this am.  CHIEF COMPLAINT:   Chief Complaint  Patient presents with  . Fall  . Weakness  . Diarrhea     REVIEW OF SYSTEMS:   Review of Systems  Constitutional: Negative for fever, malaise/fatigue and diaphoresis.  Respiratory: Negative for shortness of breath.   Cardiovascular: Negative for chest pain and palpitations.  Gastrointestinal: Negative for nausea, vomiting, abdominal pain, diarrhea and blood in stool.  Genitourinary: Negative for dysuria.  Neurological: Negative for weakness.    DRUG ALLERGIES:  No Known Allergies  VITALS:  Blood pressure 116/58, pulse 52, temperature 97.8 F (36.6 C), temperature source Oral, resp. rate 16, height 5\' 8"  (1.727 m), weight 100.426 kg (221 lb 6.4 oz), SpO2 100 %.  PHYSICAL EXAMINATION:  GENERAL:  81 y.o.-year-old patient lying in the bed, no distress LUNGS: Normal breath sounds bilaterally, no wheezing, rales,rhonchi or crepitation. No use of accessory muscles of respiration.  CARDIOVASCULAR: S1, S2 normal. No murmurs, rubs, or gallops.  ABDOMEN: Soft, nontender, distended, tympanic. Bowel sounds present. No organomegaly or mass.  EXTREMITIES: Bilateral pedal edema present.Marland Kitchen. NEUROLOGIC: Cranial nerves II through XII are intact. Muscle strength 5/5 in all extremities. Sensation intact. Gait not checked.  PSYCHIATRIC: Sleepy, oriented, calm  SKIN: Large skin tear over left forearm   LABORATORY PANEL:   CBC  Recent Labs Lab 03/21/15 0740  WBC 8.5  HGB  11.9*  HCT 36.0*  PLT 177   ------------------------------------------------------------------------------------------------------------------  Chemistries   Recent Labs Lab 03/17/15 0524  03/21/15 0740  NA 131*  < > 131*  K 4.1  < > 4.0  CL 103  < > 100*  CO2 23  < > 24  GLUCOSE 102*  < > 103*  BUN 77*  < > 90*  CREATININE 2.89*  < > 2.67*  CALCIUM 9.1  < > 9.4  AST 38  --   --   ALT 27  --   --   ALKPHOS 70  --   --   BILITOT 0.5  --   --   < > = values in this interval not displayed. ------------------------------------------------------------------------------------------------------------------  Cardiac Enzymes No results for input(s): TROPONINI in the last 168 hours. ------------------------------------------------------------------------------------------------------------------  RADIOLOGY:  Ct Head Wo Contrast  03/20/2015  CLINICAL DATA:  Confusion. EXAM: CT HEAD WITHOUT CONTRAST TECHNIQUE: Contiguous axial images were obtained from the base of the skull through the vertex without intravenous contrast. COMPARISON:  03/13/2015 FINDINGS: Ventricles are normal in configuration. There is ventricular and sulcal enlargement reflecting mild to moderate atrophy. There are no parenchymal masses or mass effect. There are patchy areas of white matter hypoattenuation consistent with moderate chronic microvascular ischemic change. There is no evidence of a cortical infarct. There are no extra-axial masses or abnormal fluid collections. Small amount of fat along the anterior falx cerebri, stable. No intracranial hemorrhage. Visualized sinuses and mastoid air cells are clear. IMPRESSION: 1. No acute intracranial abnormalities. Stable appearance from the recent prior exam. Electronically Signed   By: Amie Portlandavid  Ormond M.D.   On: 03/20/2015 13:13   Dg  Abd 2 Views  03/21/2015  CLINICAL DATA:  Followup adynamic ileus. Patient with a history of C-difficile. EXAM: ABDOMEN - 2 VIEW COMPARISON:   03/19/2015 FINDINGS: There is increased gas throughout the bowel including small bowel and colon, but no significant bowel dilation. No significant air-fluid levels. No free air. Calcifications are noted along the aorta and iliac and femoral arteries. Soft tissues otherwise unremarkable. Bones are demineralized degenerative changes of the lumbar spine. IMPRESSION: 1. Increased bowel gas diffusely, with no significant bowel dilation or air-fluid levels on the erect view. Findings may still reflect a mild adynamic ileus. There is no evidence of obstruction no free air. Electronically Signed   By: Amie Portland M.D.   On: 03/21/2015 13:00    EKG:   Orders placed or performed during the hospital encounter of 03/13/15  . ED EKG  . ED EKG  . EKG 12-Lead  . EKG 12-Lead    ASSESSMENT AND PLAN:   [name] is a 81 y.o. male with a known history of chronic atrial fibrillation, on Coumadin, congestive heart failure, hypothyroidism, chronic liver cirrhosis and recurrent C. difficile colitis was just admitted to the hospital and treated with by mouth vancomycin for C. difficile colitis. Patient finished his antibiotic course just 1 week ago but still having ongoing diarrhea. For the past 2 days his diarrhea has been worse and copious. Patient was feeling weak and lightheaded and sustained a fall but did not pass out. According to family members patient is slightly confused and not being himself  1) recurrent Clostridium difficile colitis - Appreciate GI consultation and consideration for fecal transplant - on high dose oral vanc,flagyl. - Will need to complete 2 weeks of vancomycin prior to fecal transplant. Stop date will be 11/20 Continue by mouth vancomycin Ileus by  abdominal x-ray yesterday. Patient received stool suppository .follow up xray abdomen tomorrow. Abdomen is soft,non distended.  #2 acute on chronic kidney disease stage 4 - Due to ATN volume depletion, improving - Continue to avoid  nephrotoxins - appreciate nephrology following Worsening renal functions;so we stopped lasix yesterday and started gentle hydration  Today renal function is better, #3 congestive heart failure:  And volume depletion;hold lasix today also,continue IV hydration for worsening renal failure #4 atrial fibrillation, chronic - Slight bradycardia ;decreased dose of amiodarone  Continue Coumadin per pharmacy, INR today is 2.9  5. Hypothyroidism - Continue Synthyroid  6. Generalized weakness with near syncope - PT eval pending, SNF placement needed, wife is reviewing bed offers  7. Recent history of acute esophagitis - PPI  8. Left hip pain - no fracture on xray  9. Left forearm skin tear - WOC  10 dysuria - UA negative for infection or hematuria 11.Hyponatremia: hypervolemic hypo-osmotic hyponatremia. Consistent with volume overload from hepatic cirrhosis and congestive heart failure systolic with EF of 30-35% (echo 01/17/15)  - Continue to hold metolazone. Hyponatremia improved. #12. Confusion due to metabolic encephalopathy and  dehydration;improved,back to baseline.CT head unremarkable.UA no evidence of infection.chest xray possible left lower lobe atelectasis  but he has no fever ,no hypoxia,so monitor closley. D/w wife All the records are reviewed and case discussed with Care Management/Social Workerr. Management plans discussed with the patient, family and they are in agreement.  CODE STATUS: full  TOTAL TIME TAKING CARE OF THIS PATIENT: 25 minutes.   POSSIBLE D/C IN 2-3 DAYS, DEPENDING ON CLINICAL CONDITION.   Katha Hamming M.D on 03/21/2015 at 1:30 PM  Between 7am to 6pm - Pager - [phone]  After 6pm go to [email or web address] - password [email or web address] [facility]  [facility]  Office  [phone]  CC: Primary care physician; Sula Rumple, MD

## 2015-03-21 NOTE — Consult Note (Signed)
  GI Inpatient Follow-up Note  Patient Identification: Archer AsaBobby Y Swanger is a [age] y.o. male  Subjective: Had BM earlier. Abdomen still distended. Scheduled Inpatient Medications:  . amiodarone  200 mg Oral Daily  . aspirin EC  81 mg Oral Daily  . atorvastatin  20 mg Oral QHS  . cholecalciferol  1,000 Units Oral Daily  . famotidine  20 mg Oral Daily  . levothyroxine  150 mcg Oral QAC breakfast  . midodrine  5 mg Oral TID WC  . saccharomyces boulardii  250 mg Oral BID  . vancomycin  500 mg Oral 4 times per day  . warfarin  2.5 mg Oral q1800  . Warfarin - Pharmacist Dosing Inpatient   Does not apply q1800    Continuous Inpatient Infusions:   . sodium chloride 1,000 mL (03/20/15 1703)    PRN Inpatient Medications:  acetaminophen **OR** acetaminophen  Review of Systems: Constitutional: Weight is stable.  Eyes: No changes in vision. ENT: No oral lesions, sore throat.  GI: see HPI.  Heme/Lymph: No easy bruising.  CV: No chest pain.  GU: No hematuria.  Integumentary: No rashes.  Neuro: No headaches.  Psych: No depression/anxiety.  Endocrine: No heat/cold intolerance.  Allergic/Immunologic: No urticaria.  Resp: No cough, SOB.  Musculoskeletal: No joint swelling.    Physical Examination: BP 116/58 mmHg  Pulse 52  Temp(Src) 97.8 F (36.6 C) (Oral)  Resp 16  Ht 5\' 8"  (1.727 m)  Wt 100.426 kg (221 lb 6.4 oz)  BMI 33.67 kg/m2  SpO2 100% Gen: NAD, alert and oriented x 4 HEENT: PEERLA, EOMI, Neck: supple, no JVD or thyromegaly Chest: CTA bilaterally, no wheezes, crackles, or other adventitious sounds CV: RRR, no m/g/c/r Abd: soft, NT, Distended, Decreased bowel sounds in all four quadrants; no HSM, guarding, ridigity, or rebound tenderness Ext: no edema, well perfused with 2+ pulses, Skin: no rash or lesions noted Lymph: no LAD  Data: Lab Results  Component Value Date   WBC 8.5 03/21/2015   HGB 11.9* 03/21/2015   HCT 36.0* 03/21/2015   MCV 102.2* 03/21/2015   PLT  177 03/21/2015    Recent Labs Lab 03/18/15 0610 03/19/15 0501 03/21/15 0740  HGB 11.7* 11.9* 11.9*   Lab Results  Component Value Date   NA 131* 03/21/2015   K 4.0 03/21/2015   CL 100* 03/21/2015   CO2 24 03/21/2015   BUN 90* 03/21/2015   CREATININE 2.67* 03/21/2015   Lab Results  Component Value Date   ALT 27 03/17/2015   AST 38 03/17/2015   ALKPHOS 70 03/17/2015   BILITOT 0.5 03/17/2015    Recent Labs Lab 03/21/15 0740  INR 2.96   Assessment/Plan: Mr. Tanya Nonesickard is a 81 y.o. male  With recurrent c.diff. And ileus.  Recommendations: Continue po vanco. Will repeat abdominal x-ray. Dr. Mechele CollinElliott to see pt tomorrow. Please call with questions or concerns.  Kedarius Aloisi, Ezzard StandingPAUL Y, MD

## 2015-03-22 LAB — CBC
HCT: 32.7 % — ABNORMAL LOW (ref 40.0–52.0)
HEMOGLOBIN: 10.8 g/dL — AB (ref 13.0–18.0)
MCH: 33.6 pg (ref 26.0–34.0)
MCHC: 33.2 g/dL (ref 32.0–36.0)
MCV: 101.3 fL — ABNORMAL HIGH (ref 80.0–100.0)
Platelets: 175 10*3/uL (ref 150–440)
RBC: 3.23 MIL/uL — AB (ref 4.40–5.90)
RDW: 16 % — ABNORMAL HIGH (ref 11.5–14.5)
WBC: 7.4 10*3/uL (ref 3.8–10.6)

## 2015-03-22 LAB — BASIC METABOLIC PANEL
Anion gap: 6 (ref 5–15)
BUN: 86 mg/dL — ABNORMAL HIGH (ref 6–20)
CHLORIDE: 103 mmol/L (ref 101–111)
CO2: 25 mmol/L (ref 22–32)
CREATININE: 2.41 mg/dL — AB (ref 0.61–1.24)
Calcium: 8.9 mg/dL (ref 8.9–10.3)
GFR, EST AFRICAN AMERICAN: 28 mL/min — AB (ref >60)
GFR, EST NON AFRICAN AMERICAN: 24 mL/min — AB (ref >60)
Glucose, Bld: 95 mg/dL (ref 65–99)
POTASSIUM: 3.5 mmol/L (ref 3.5–5.1)
SODIUM: 134 mmol/L — AB (ref 135–145)

## 2015-03-22 LAB — PROTIME-INR
INR: 3.13
PROTHROMBIN TIME: 31.6 s — AB (ref 11.4–15.0)

## 2015-03-22 MED ORDER — WARFARIN SODIUM 1 MG PO TABS
2.0000 mg | ORAL_TABLET | Freq: Every day | ORAL | Status: DC
Start: 2015-03-22 — End: 2015-03-24
  Administered 2015-03-22 – 2015-03-23 (×2): 2 mg via ORAL
  Filled 2015-03-22 (×2): qty 2

## 2015-03-22 NOTE — Progress Notes (Signed)
Physical Therapy Treatment Patient Details Name: Blake White MRN: [id number] DOB: [birth year] Today's Date: 03/22/2015    History of Present Illness Pt is a 81 y.o. male presenting with generalized weakness, ongoing diarrhea, and fall.  Pt admitted with AMS with recurrent C. difficile colitis.  Pt with L hip pain (imaging negative for fx); CT head negative.  Pt with recent hospital stay September 2016 (d/t AKI and c-diff).  PMH includes chronic a-fib on Coumadin, CHF, recurrent c-diff, CVA.    PT Comments    Patient with noted improvement in level of alertness and functional ability this date.  Able to complete sit/stand and OOB to chair transfer with RW, min assist +2 for safety.  Patient very slow and labored, frequent cuing for redirection to task and for management of RW, but no buckling or LOB. Notably fatigued after minimal effort; will plan to trial additional gait distance as able next session.   Follow Up Recommendations  SNF     Equipment Recommendations  Rolling walker with 5" wheels    Recommendations for Other Services       Precautions / Restrictions Precautions Precautions: Fall Precaution Comments: enteric isolation Restrictions Weight Bearing Restrictions: No    Mobility  Bed Mobility Overal bed mobility: Needs Assistance Bed Mobility: Supine to Sit     Supine to sit: Mod assist     General bed mobility comments: improved active effort with movement transition, but lacks strength to complete transfer without assist from therapist  Transfers Overall transfer level: Needs assistance Equipment used: Rolling walker (2 wheeled) Transfers: Sit to/from Stand Sit to Stand: Min assist;+2 physical assistance         General transfer comment: cuing for hand placement  Ambulation/Gait Ambulation/Gait assistance: Min assist;+2 physical assistance Ambulation Distance (Feet): 5 Feet Assistive device: Rolling walker (2 wheeled)       General Gait  Details: very short, shuffling steps; min assist for walker management; mod verbal cuing for task direction and recall.  Notably fatigued after minimal activity;a dditional distance deferred as a result.   Stairs            Wheelchair Mobility    Modified Rankin (Stroke Patients Only)       Balance Overall balance assessment: Needs assistance Sitting-balance support: No upper extremity supported;Feet supported Sitting balance-Leahy Scale: Good     Standing balance support: Bilateral upper extremity supported Standing balance-Leahy Scale: Fair                      Cognition Arousal/Alertness: Awake/alert Behavior During Therapy: WFL for tasks assessed/performed Overall Cognitive Status: Within Functional Limits for tasks assessed                      Exercises Other Exercises Other Exercises: Seated LE therex, 1x10, AROM for muscular strength/endurance with functional activities. Other Exercises: Sit/stand with RW x5 from bed height, min assist +2 for safety.  Good tolerance for activity progression; no buckling or LOB.    General Comments        Pertinent Vitals/Pain Pain Assessment: No/denies pain    Home Living     Available Help at Discharge: Family                Prior Function            PT Goals (current goals can now be found in the care plan section) Acute Rehab PT Goals Patient Stated Goal: to get stronger PT  Goal Formulation: With patient/family Time For Goal Achievement: 03/30/15 Potential to Achieve Goals: Good Progress towards PT goals: Progressing toward goals    Frequency  Min 2X/week    PT Plan Current plan remains appropriate    Co-evaluation             End of Session Equipment Utilized During Treatment: Gait belt Activity Tolerance: Patient tolerated treatment well Patient left: in chair;with call bell/phone within reach;with chair alarm set;with family/visitor present     Time: [id number] PT  Time Calculation (min) (ACUTE ONLY): 23 min  Charges:  $Therapeutic Exercise: 8-22 mins $Therapeutic Activity: 8-22 mins                    G Codes:      Mildred Tuccillo H. Manson PasseyBrown, PT, DPT, NCS 03/22/2015, 3:44 PM [phone]

## 2015-03-22 NOTE — Progress Notes (Signed)
Administration paged MD to come speak to pt family and address concerns. Dr. Cherylann RatelLateef and Dr. Luberta MutterKonidena rounded on pt and addressed family concerns. Foley removed per MD order. Pt sitting in bed eating lunch. Wife at bedside. Continue to assess.

## 2015-03-22 NOTE — Progress Notes (Signed)
[facility] [place] at  alert, awake, oriented. Eating the lunch by himself. However her mental status is improved.AAlert, awake, oriented. Alert, awake, orientedla All of abdomen x-ray tomorrow. abdomin awakeawake,[facility]   PATIENT NAME: Blake White    MR#:  [id number]  DATE OF BIRTH:  January 25, 1934  SUBJECTIVE: Seen today, he is alert, oriented. Having lunch. No shortness of breath, no chest pain. No abdominal pain or diarrhea. Patient family had a lot of questions about his dehydration. .  CHIEF COMPLAINT:   Chief Complaint  Patient presents with  . Fall  . Weakness  . Diarrhea     REVIEW OF SYSTEMS:   Review of Systems  Constitutional: Negative for fever, chills, malaise/fatigue and diaphoresis.  HENT: Negative for hearing loss.   Eyes: Negative for blurred vision, double vision and photophobia.  Respiratory: Negative for cough, hemoptysis and shortness of breath.   Cardiovascular: Positive for leg swelling. Negative for chest pain, palpitations and orthopnea.  Gastrointestinal: Negative for nausea, vomiting, abdominal pain, diarrhea and blood in stool.  Genitourinary: Negative for dysuria and urgency.  Musculoskeletal: Negative for myalgias and neck pain.  Skin: Negative for rash.  Neurological: Negative for dizziness, tingling, focal weakness, seizures, weakness and headaches.  Psychiatric/Behavioral: Negative for memory loss. The patient does not have insomnia.     DRUG ALLERGIES:  No Known Allergies  VITALS:  Blood pressure 97/48, pulse 51, temperature 97.7 F (36.5 C), temperature source Oral, resp. rate 16, height  (1.727 m), weight 102.876 kg (226 lb 12.8 oz), SpO2 98 %.  PHYSICAL EXAMINATION:  GENERAL:  81 y.o.-year-old patient lying in the bed, no distress LUNGS: Normal breath sounds bilaterally, no wheezing, rales,rhonchi or crepitation. No use of accessory muscles of respiration.  CARDIOVASCULAR: S1, S2 normal. No murmurs,  rubs, or gallops.  ABDOMEN: Soft, nontender, distended, tympanic. Bowel sounds present. No organomegaly or mass.  EXTREMITIES: Bilateral pedal edema present.Marland Kitchen NEUROLOGIC: Cranial nerves II through XII are intact. Muscle strength 5/5 in all extremities. Sensation intact. Gait not checked.  PSYCHIATRIC: Sleepy, oriented, calm  SKIN: Large skin tear over left forearm   LABORATORY PANEL:   CBC  Recent Labs Lab 03/22/15 0653  WBC 7.4  HGB 10.8*  HCT 32.7*  PLT 175   ------------------------------------------------------------------------------------------------------------------  Chemistries   Recent Labs Lab 03/17/15 0524  03/22/15 0653  NA 131*  < > 134*  K 4.1  < > 3.5  CL 103  < > 103  CO2 23  < > 25  GLUCOSE 102*  < > 95  BUN 77*  < > 86*  CREATININE 2.89*  < > 2.41*  CALCIUM 9.1  < > 8.9  AST 38  --   --   ALT 27  --   --   ALKPHOS 70  --   --   BILITOT 0.5  --   --   < > = values in this interval not displayed. ------------------------------------------------------------------------------------------------------------------  Cardiac Enzymes No results for input(s): TROPONINI in the last 168 hours. ------------------------------------------------------------------------------------------------------------------  RADIOLOGY:  Dg Abd 2 Views  03/21/2015  CLINICAL DATA:  Followup adynamic ileus. Patient with a history of C-difficile. EXAM: ABDOMEN - 2 VIEW COMPARISON:  03/19/2015 FINDINGS: There is increased gas throughout the bowel including small bowel and colon, but no significant bowel dilation. No significant air-fluid levels. No free air. Calcifications are noted along the aorta and iliac and femoral arteries. Soft tissues otherwise unremarkable. Bones are demineralized degenerative changes of the lumbar spine. IMPRESSION:  1. Increased bowel gas diffusely, with no significant bowel dilation or air-fluid levels on the erect view. Findings may still reflect a mild  adynamic ileus. There is no evidence of obstruction no free air. Electronically Signed   By: Amie Portlandavid  Ormond M.D.   On: 03/21/2015 13:00    EKG:   Orders placed or performed during the hospital encounter of 03/13/15  . ED EKG  . ED EKG  . EKG 12-Lead  . EKG 12-Lead    ASSESSMENT AND PLAN:   [name] is a 81 y.o. male with a known history of chronic atrial fibrillation, on Coumadin, congestive heart failure, hypothyroidism, chronic liver cirrhosis and recurrent C. difficile colitis was just admitted to the hospital and treated with by mouth vancomycin for C. difficile colitis. lightheaded and sustained a fall but did not pass out. According to family members patient is slightly confused and not being himself. Admitted for recurrent C. difficile colitis 1) recurrent Clostridium difficile colitis - Appreciate GI consultation and consideration for fecal transplant - on high dose oral vanc,flagyl. - Will need to complete 2 weeks of vancomycin prior to fecal transplant. Stop date will be 11/20 Continue by mouth vancomycin Mild adynamic ileus by xray abdomen yesterday,   #2 acute on chronic kidney disease stage 4 - Due to ATN volume depletion, improving - Continue to avoid nephrotoxins - appreciate nephrology following  because of Worsening renal functions;so we stopped lasix yesterday and started gentle hydration  Today renal function is better, #3 congestive heart failure:  And volume depletion;hold lasix today also,continue IV hydration for worsening renal failure #4 atrial fibrillation, chronic - Slight bradycardia ;decreased dose of amiodarone  Continue Coumadin per pharmacy, INR   5. Hypothyroidism - Continue Synthyroid  6. Generalized weakness with near syncope - PT eval  Recommends  SNF placement needed, wife chose offers at [facility] 7. Recent history of acute esophagitis - PPI  8. Left hip pain - no fracture on xray  9. Left forearm skin tear - WOC  10  dysuria. D/c  Foley today and monitor the  Post void  residual is more than 600 reinsert  the Foley. - UA negative for infection or hematuria 11.Hyponatremia: hypervolemic hypo-osmotic hyponatremia. Consistent with volume overload from hepatic cirrhosis and congestive heart failure systolic with EF of 30-35% (echo 01/17/15)  - Continue to hold metolazone. Hyponatremia improved. #12. Confusion due to metabolic encephalopathy and  dehydration;improved,back to baseline.CT head unremarkable.UA no evidence of infection.chest xray possible left lower lobe atelectasis  but he has no fever ,no hypoxia,so monitor closley. His mental status improved with IV hydration. . son now had questions about patient's mental status and hydration. Dr. Cherylann RatelLateef has spoken to him. All the records are reviewed and case discussed with Care Management/Social Workerr. Management plans discussed with the patient, family and they are in agreement.  CODE STATUS: full  TOTAL TIME TAKING CARE OF THIS PATIENT: 25 minutes.   POSSIBLE D/C IN 2-3 DAYS, DEPENDING ON CLINICAL CONDITION.   Katha HammingKONIDENA,Fairy Ashlock M.D on 03/22/2015 at 1:25 PM  Between 7am to 6pm - Pager - [phone]  After 6pm go to [email or web address] - password [email or web address] [facility]  [facility]  Office  [phone]  CC: Primary care physician; [name], Charanjit, MD

## 2015-03-22 NOTE — Consult Note (Signed)
ANTICOAGULATION CONSULT NOTE - Follow Up Consult  Pharmacy Consult for warfarin Indication: VTE prophylaxis  No Known Allergies  Patient Measurements: Height: 5\' 8"  (172.7 cm) Weight: 226 lb 12.8 oz (102.876 kg) IBW/kg (Calculated) : 68.4  Vital Signs: Temp: 97.7 F (36.5 C) (11/14 0452) Temp Source: Oral (11/14 0452) BP: 97/48 mmHg (11/14 0452) Pulse Rate: 51 (11/14 0452)  Labs:  Recent Labs  03/20/15 0453 03/21/15 0740 03/22/15 0653  HGB  --  11.9* 10.8*  HCT  --  36.0* 32.7*  PLT  --  177 175  LABPROT 29.9* 30.3* 31.6*  INR 2.91 2.96 3.13  CREATININE 2.81* 2.67* 2.41*    Estimated Creatinine Clearance: 28.9 mL/min (by C-G formula based on Cr of 2.41).   Medications:  Scheduled:  . amiodarone  200 mg Oral Daily  . aspirin EC  81 mg Oral Daily  . atorvastatin  20 mg Oral QHS  . cholecalciferol  1,000 Units Oral Daily  . famotidine  20 mg Oral Daily  . levothyroxine  150 mcg Oral QAC breakfast  . midodrine  5 mg Oral TID WC  . saccharomyces boulardii  250 mg Oral BID  . vancomycin  500 mg Oral 4 times per day  . warfarin  2.5 mg Oral q1800  . Warfarin - Pharmacist Dosing Inpatient   Does not apply q1800    Assessment: Pt is a 81 year old male who presents to the ED after a fall. Pt has had several days of diarrhea, following cdiff tx a few weeks ago. Pts scans are negative for a bleed due to his fall. INR on arrival is subtherapeutic at 1.68. Pt home dose is 3mg  daily. Pt is on warfarin for Afib.  Goal of Therapy:  INR 2-3 Monitor platelets by anticoagulation protocol: Yes   Plan:  Since INR is subtherapeutic will give 4mg  tonight. Pts current illness/potentially being restarted on antibiotics both have potential to raise INR. Will monitor closely. Will recheck INR in the AM.  11/6 INR 2.03. Restart home dose of 3 mg daily. INR in AM.  11/7 INR 2.59. Continue 3 mg daily. Daily INR ordered while pt is on antibiotics.  11/8 INR 2.84. Continue 3mg  daily.  Monitor INR daily.  11/9 INR 2.91 Continue 3mg  daily. INR trending up, likely to be supratherapeutic tomorrow and require dose reduction.  11/10 INR 3.00. Decrease dose to 2.5mg  daily, however expecting INR to be elevated tomorrow. Recheck INR in am.  11/11 INR 2.83. Continue with decreased dose of 2.5mg  daily. Will follow up on INR tomorrow.  11/12 INR: 2.81. Continue with warfarin 2.5 mg Po daily. Will follow up on INR tomorrow.  Continue to monitor daily..  11/13: INR: 2.96. Continue warfarin 2.5 mg PO daily. Will follow up on INR tomorrow.   11/14: INR 3.13 INR is slightly supratherapeutic. Will decrease dose to 2mg  daily. Recheck INR in the AM  Crescencio Jozwiak D Rosanne Wohlfarth 03/22/2015,7:48 AM

## 2015-03-22 NOTE — Progress Notes (Signed)
Subjective:  Cr improved down to 2.41.  Pt seems a bit more awake and alert at present.  Wife currently at bedside. Good UOP noted.   Objective:  Vital signs in last 24 hours:  Temp:  [97.7 F (36.5 C)-98.1 F (36.7 C)] 97.7 F (36.5 C) (11/14 0452) Pulse Rate:  [51-55] 51 (11/14 0452) Resp:  [16-17] 16 (11/14 0452) BP: (97-116)/(48-54) 97/48 mmHg (11/14 0452) SpO2:  [96 %-98 %] 98 % (11/14 0452) Weight:  [102.876 kg (226 lb 12.8 oz)] 102.876 kg (226 lb 12.8 oz) (11/14 0452)  Weight change: 2.449 kg (5 lb 6.4 oz) Filed Weights   03/20/15 0554 03/21/15 0500 03/22/15 0452  Weight: 101.288 kg (223 lb 4.8 oz) 100.426 kg (221 lb 6.4 oz) 102.876 kg (226 lb 12.8 oz)    Intake/Output:    Intake/Output Summary (Last 24 hours) at 03/22/15 1416 Last data filed at 03/22/15 1240  Gross per 24 hour  Intake    900 ml  Output   1225 ml  Net   -325 ml     Physical Exam: General: NAD  HEENT Anicteric, moist oral mucosal membranes  Neck supple  Pulm/lungs Normal effort, clear ant and laterally  CVS/Heart S1S2 no rubs  Abdomen:  Soft, distended, BS present  Extremities: ++ dependent edema  Neurologic: Awake, alert to place/person, off on date, follows commands  Skin: Left olecrenon laceration, in dressings   GU Foley catheter       Basic Metabolic Panel:   Recent Labs Lab 03/18/15 0610 03/19/15 0501 03/20/15 0453 03/21/15 0740 03/22/15 0653  NA 129* 131* 132* 131* 134*  K 4.1 4.5 3.8 4.0 3.5  CL 98* 98* 99* 100* 103  CO2 GLUCOSE 106* 95 102* 103* 95  BUN 74* 77* 84* 90* 86*  CREATININE 2.72* 2.76* 2.81* 2.67* 2.41*  CALCIUM 8.9 9.2 9.4 9.4 8.9     CBC:  Recent Labs Lab 03/17/15 0524 03/18/15 0610 03/19/15 0501 03/21/15 0740 03/22/15 0653  WBC 8.8 7.8 9.2 8.5 7.4  HGB 11.5* 11.7* 11.9* 11.9* 10.8*  HCT 35.2* 35.8* 36.0* 36.0* 32.7*  MCV 102.5* 103.3* 102.3* 102.2* 101.3*  PLT 146* 170 189 177 175      Microbiology:  Recent  Results (from the past 720 hour(s))  C difficile quick scan w PCR reflex     Status: Abnormal   Collection Time: 03/13/15  4:58 PM  Result Value Ref Range Status   C Diff antigen POSITIVE (A) NEGATIVE Final   C Diff toxin POSITIVE (A) NEGATIVE Final   C Diff interpretation   Final    Positive for toxigenic C. difficile, active toxin production present.    Comment: CRITICAL RESULT CALLED TO, READ BACK BY AND VERIFIED WITH: [facility] MOFFITT RN AT 1830 03/13/2015   Urine culture     Status: None   Collection Time: 03/14/15  6:35 PM  Result Value Ref Range Status   Specimen Description URINE, RANDOM  Final   Special Requests NONE  Final   Culture NO GROWTH 1 DAY  Final   Report Status 03/16/2015 FINAL  Final  Stool culture     Status: None   Collection Time: 03/15/15  1:49 AM  Result Value Ref Range Status   Specimen Description STOOL  Final   Special Requests NONE  Final   Culture   Final    NO CAMPYLOBACTER DETECTED NO SALMONELLA OR SHIGELLA ISOLATED No Pathogenic E. coli detected    Report  Status 03/18/2015 FINAL  Final    Coagulation Studies:  Recent Labs  03/20/15 0453 03/21/15 0740 03/22/15 0653  LABPROT 29.9* 30.3* 31.6*  INR 2.91 2.96 3.13    Urinalysis:  Recent Labs  03/20/15 1226  COLORURINE YELLOW*  LABSPEC 1.009  PHURINE 5.0  GLUCOSEU NEGATIVE  HGBUR 2+*  BILIRUBINUR NEGATIVE  KETONESUR NEGATIVE  PROTEINUR NEGATIVE  NITRITE NEGATIVE  LEUKOCYTESUR NEGATIVE      Imaging: Dg Abd 2 Views  03/21/2015  CLINICAL DATA:  Followup adynamic ileus. Patient with a history of C-difficile. EXAM: ABDOMEN - 2 VIEW COMPARISON:  03/19/2015 FINDINGS: There is increased gas throughout the bowel including small bowel and colon, but no significant bowel dilation. No significant air-fluid levels. No free air. Calcifications are noted along the aorta and iliac and femoral arteries. Soft tissues otherwise unremarkable. Bones are demineralized degenerative changes of the  lumbar spine. IMPRESSION: 1. Increased bowel gas diffusely, with no significant bowel dilation or air-fluid levels on the erect view. Findings may still reflect a mild adynamic ileus. There is no evidence of obstruction no free air. Electronically Signed   By: Amie Portlandavid  Ormond M.D.   On: 03/21/2015 13:00     Medications:   . sodium chloride 50 mL/hr at 03/22/15 0548   . amiodarone  200 mg Oral Daily  . aspirin EC  81 mg Oral Daily  . atorvastatin  20 mg Oral QHS  . cholecalciferol  1,000 Units Oral Daily  . famotidine  20 mg Oral Daily  . levothyroxine  150 mcg Oral QAC breakfast  . midodrine  5 mg Oral TID WC  . saccharomyces boulardii  250 mg Oral BID  . vancomycin  500 mg Oral 4 times per day  . warfarin  2 mg Oral q1800  . Warfarin - Pharmacist Dosing Inpatient   Does not apply q1800   acetaminophen **OR** acetaminophen  Assessment/ Plan:  81 y.o. white male with congestive heart failure, atrial fibrillation, chronic anticoagulation, hypothyroidism, BPH, hyperlipidemia, hypertension, coronary artery disease status post CABG, Liver cirrhosis who [facility] for C. Diff colitis.   1. Acute kidney failure on chronic kidney disease stage IV. Baseline 2.78/GFR 20. [facility] as an [facility].  Creatinine appears to be now at baseline at 2.4. BUN still high however. Good urine output noted. We will remove Foley catheter and make sure he doesn't have urinary retention..  2. Hyponatremia: hypervolemic hypo-osmotic hyponatremia. Originally had volume overload from hepatic cirrhosis and congestive heart failure systolic with EF of 30-35% (echo 01/17/15) - Na up to 134. Continue gentle IV fluid hydration.  3. Acute CHF and Edema: with history of hepatic cirrhosis and systolic congestive heart failure EF 30-35% 01/17/15 - holding lasix for now given worsening renal function.   4. Hypotension: Blood pressure found to be 97/48 today, continue midodrine.   5. Infective  Colitis: C. Diff A09 - on PO vancomycin. - stool transplant being considered.      LOS: 9 Blake White 11/14/20162:16 PM

## 2015-03-22 NOTE — Progress Notes (Signed)
Pt son came to unit reporting he had questions and had spoken with "the administrator" on the phone prior to today, to meet up. Nurse and Consulting civil engineerCharge RN addressed concerns as able to. Nurse answered pt question about number of IV fluid bags received since last Saturday (3 bags) thus far. I and charge nurse redirected patient to speak with administration per pt son's request. Administration currently speaking with patient son.  Pt resting in bed. Continue to assess.

## 2015-03-22 NOTE — Care Management Important Message (Signed)
Important Message  Patient Details  Name: Blake White MRN: [id number] Date of Birth: 04/19/1934   Medicare Important Message Given:  Yes    [name], Mister Krahenbuhl L, RN 03/22/2015, 10:25 AM

## 2015-03-23 LAB — PROTIME-INR
INR: 2.83
PROTHROMBIN TIME: 29.3 s — AB (ref 11.4–15.0)

## 2015-03-23 LAB — BASIC METABOLIC PANEL
ANION GAP: 5 (ref 5–15)
BUN: 84 mg/dL — ABNORMAL HIGH (ref 6–20)
CALCIUM: 8.7 mg/dL — AB (ref 8.9–10.3)
CHLORIDE: 106 mmol/L (ref 101–111)
CO2: 23 mmol/L (ref 22–32)
CREATININE: 2.46 mg/dL — AB (ref 0.61–1.24)
GFR calc non Af Amer: 23 mL/min — ABNORMAL LOW (ref >60)
GFR, EST AFRICAN AMERICAN: 27 mL/min — AB (ref >60)
Glucose, Bld: 100 mg/dL — ABNORMAL HIGH (ref 65–99)
Potassium: 3.5 mmol/L (ref 3.5–5.1)
SODIUM: 134 mmol/L — AB (ref 135–145)

## 2015-03-23 NOTE — Consult Note (Signed)
ANTICOAGULATION CONSULT NOTE - Follow Up Consult  Pharmacy Consult for warfarin Indication: VTE prophylaxis  No Known Allergies  Patient Measurements: Height: 5\' 8"  (172.7 cm) Weight: 227 lb 4.8 oz (103.103 kg) IBW/kg (Calculated) : 68.4  Vital Signs: Temp: 98.2 F (36.8 C) (11/15 0455) Temp Source: Oral (11/15 0455) BP: 93/56 mmHg (11/15 0455) Pulse Rate: 53 (11/15 0455)  Labs:  Recent Labs  03/21/15 0740 03/22/15 0653 03/23/15 0435  HGB 11.9* 10.8*  --   HCT 36.0* 32.7*  --   PLT 177 175  --   LABPROT 30.3* 31.6* 29.3*  INR 2.96 3.13 2.83  CREATININE 2.67* 2.41*  --     Estimated Creatinine Clearance: 28.9 mL/min (by C-G formula based on Cr of 2.41).   Medications:  Scheduled:  . amiodarone  200 mg Oral Daily  . aspirin EC  81 mg Oral Daily  . atorvastatin  20 mg Oral QHS  . cholecalciferol  1,000 Units Oral Daily  . famotidine  20 mg Oral Daily  . levothyroxine  150 mcg Oral QAC breakfast  . midodrine  5 mg Oral TID WC  . saccharomyces boulardii  250 mg Oral BID  . vancomycin  500 mg Oral 4 times per day  . warfarin  2 mg Oral q1800  . Warfarin - Pharmacist Dosing Inpatient   Does not apply q1800    Assessment: Pt is a 81 year old male who presents to the ED after a fall. Pt has had several days of diarrhea, following cdiff tx a few weeks ago. Pts scans are negative for a bleed due to his fall. INR on arrival is subtherapeutic at 1.68. Pt home dose is 3mg  daily. Pt is on warfarin for Afib.  Goal of Therapy:  INR 2-3 Monitor platelets by anticoagulation protocol: Yes   Plan:  Since INR is subtherapeutic will give 4mg  tonight. Pts current illness/potentially being restarted on antibiotics both have potential to raise INR. Will monitor closely. Will recheck INR in the AM.  11/6 INR 2.03. Restart home dose of 3 mg daily. INR in AM.  11/7 INR 2.59. Continue 3 mg daily. Daily INR ordered while pt is on antibiotics.  11/8 INR 2.84. Continue 3mg  daily.  Monitor INR daily.  11/9 INR 2.91 Continue 3mg  daily. INR trending up, likely to be supratherapeutic tomorrow and require dose reduction.  11/10 INR 3.00. Decrease dose to 2.5mg  daily, however expecting INR to be elevated tomorrow. Recheck INR in am.  11/11 INR 2.83. Continue with decreased dose of 2.5mg  daily. Will follow up on INR tomorrow.  11/12 INR: 2.81. Continue with warfarin 2.5 mg Po daily. Will follow up on INR tomorrow.  Continue to monitor daily..  11/13: INR: 2.96. Continue warfarin 2.5 mg PO daily. Will follow up on INR tomorrow.   11/14: INR 3.13 INR is slightly supratherapeutic. Will decrease dose to 2mg  daily. Recheck INR in the AM  11/15 INR 2.83. Continue current regimen. INR in AM.  Blake White 03/23/2015,6:49 AM

## 2015-03-23 NOTE — Progress Notes (Signed)
Subjective:  Creatinine has stabilized at 2.4. BUN still high at 84. Left lower extremity was wrapped this a.m.    Objective:  Vital signs in last 24 hours:  Temp:  [97.8 F (36.6 C)-98.2 F (36.8 C)] 97.8 F (36.6 C) (11/15 1437) Pulse Rate:  [51-56] 56 (11/15 1437) Resp:  [16-18] 18 (11/15 0755) BP: (93-125)/(56-65) 125/65 mmHg (11/15 1437) SpO2:  [97 %-100 %] 100 % (11/15 1437) Weight:  [103.103 kg (227 lb 4.8 oz)] 103.103 kg (227 lb 4.8 oz) (11/15 0500)  Weight change: 0.227 kg (8 oz) Filed Weights   03/21/15 0500 03/22/15 0452 03/23/15 0500  Weight: 100.426 kg (221 lb 6.4 oz) 102.876 kg (226 lb 12.8 oz) 103.103 kg (227 lb 4.8 oz)    Intake/Output:    Intake/Output Summary (Last 24 hours) at 03/23/15 1458 Last data filed at 03/23/15 0515  Gross per 24 hour  Intake 693.96 ml  Output    100 ml  Net 593.96 ml     Physical Exam: General: NAD  HEENT Anicteric, moist oral mucosal membranes  Neck supple  Pulm/lungs Normal effort, clear ant and laterally  CVS/Heart S1S2 no rubs  Abdomen:  Soft, distended, BS present  Extremities: ++ dependent edema, LLE wrapped  Neurologic: Awake, alert to place/person, follows commands  Skin: Left olecrenon laceration, in dressings           Basic Metabolic Panel:   Recent Labs Lab 03/19/15 0501 03/20/15 0453 03/21/15 0740 03/22/15 0653 03/23/15 0435  NA 131* 132* 131* 134* 134*  K 4.5 3.8 4.0 3.5 3.5  CL 98* 99* 100* 103 106  CO2 GLUCOSE 95 102* 103* 95 100*  BUN 77* 84* 90* 86* 84*  CREATININE 2.76* 2.81* 2.67* 2.41* 2.46*  CALCIUM 9.2 9.4 9.4 8.9 8.7*     CBC:  Recent Labs Lab 03/17/15 0524 03/18/15 0610 03/19/15 0501 03/21/15 0740 03/22/15 0653  WBC 8.8 7.8 9.2 8.5 7.4  HGB 11.5* 11.7* 11.9* 11.9* 10.8*  HCT 35.2* 35.8* 36.0* 36.0* 32.7*  MCV 102.5* 103.3* 102.3* 102.2* 101.3*  PLT 146* 170 189 177 175      Microbiology:  Recent Results (from the past 720 hour(s))  C  difficile quick scan w PCR reflex     Status: Abnormal   Collection Time: 03/13/15  4:58 PM  Result Value Ref Range Status   C Diff antigen POSITIVE (A) NEGATIVE Final   C Diff toxin POSITIVE (A) NEGATIVE Final   C Diff interpretation   Final    Positive for toxigenic C. difficile, active toxin production present.    Comment: CRITICAL RESULT CALLED TO, READ BACK BY AND VERIFIED WITH: [facility] MOFFITT RN AT 1830 03/13/2015   Urine culture     Status: None   Collection Time: 03/14/15  6:35 PM  Result Value Ref Range Status   Specimen Description URINE, RANDOM  Final   Special Requests NONE  Final   Culture NO GROWTH 1 DAY  Final   Report Status 03/16/2015 FINAL  Final  Stool culture     Status: None   Collection Time: 03/15/15  1:49 AM  Result Value Ref Range Status   Specimen Description STOOL  Final   Special Requests NONE  Final   Culture   Final    NO CAMPYLOBACTER DETECTED NO SALMONELLA OR SHIGELLA ISOLATED No Pathogenic E. coli detected    Report Status 03/18/2015 FINAL  Final    Coagulation Studies:  Recent Labs  03/21/15 0740 03/22/15 0653 03/23/15 0435  LABPROT 30.3* 31.6* 29.3*  INR 2.96 3.13 2.83    Urinalysis: No results for input(s): COLORURINE, LABSPEC, PHURINE, GLUCOSEU, HGBUR, BILIRUBINUR, KETONESUR, PROTEINUR, UROBILINOGEN, NITRITE, LEUKOCYTESUR in the last 72 hours.  Invalid input(s): APPERANCEUR    Imaging: No results found.   Medications:   . sodium chloride 50 mL/hr at 03/22/15 0548   . amiodarone  200 mg Oral Daily  . aspirin EC  81 mg Oral Daily  . atorvastatin  20 mg Oral QHS  . cholecalciferol  1,000 Units Oral Daily  . famotidine  20 mg Oral Daily  . levothyroxine  150 mcg Oral QAC breakfast  . midodrine  5 mg Oral TID WC  . saccharomyces boulardii  250 mg Oral BID  . vancomycin  500 mg Oral 4 times per day  . warfarin  2 mg Oral q1800  . Warfarin - Pharmacist Dosing Inpatient   Does not apply q1800   acetaminophen **OR**  acetaminophen  Assessment/ Plan:  81 y.o. white male with congestive heart failure, atrial fibrillation, chronic anticoagulation, hypothyroidism, BPH, hyperlipidemia, hypertension, coronary artery disease status post CABG, Liver cirrhosis who [facility] for C. Diff colitis.   1. Acute kidney failure on chronic kidney disease stage IV. Baseline 2.78/GFR 20. [facility].  Renal function appears to have stabilized. Creatinine is stabilized at 2.4 which is actually below his baseline. We will stop IV fluid hydration at this point in time. Follow renal function trend for now.  2. Hyponatremia: hypervolemic hypo-osmotic hyponatremia. Originally had volume overload from hepatic cirrhosis and congestive heart failure systolic with EF of 30-35% (echo 01/17/15) - Na stable at 134. Stop 0.9 normal saline as above.  3. Acute CHF and Edema: with history of hepatic cirrhosis and systolic congestive heart failure EF 30-35% 01/17/15 - Lasix remains on hold however we will reinstitute this in the relatively near future.  4. Hypotension: Blood pressure better today at 125/65. Continue midodrine.  5. Infective Colitis: C. Diff A09 - on PO vancomycin. - stool transplant being considered.      LOS: 10 Seirra Kos 11/15/20162:58 PM

## 2015-03-23 NOTE — Progress Notes (Addendum)
RLE wrapped in una boot as ordered- unable to wrap LLE - no more una boots in materials- stated that there will be a delivery tomorrow- spoke with Clydie BraunKaren, wound nurse- stated she will come in tomorrow morning and wrap LLE.

## 2015-03-23 NOTE — Progress Notes (Signed)
[facility] [place] at  alert, awake, oriented. Eating the lunch by himself. However her mental status is improved.AAlert, awake, oriented. Alert, awake, orientedla All of abdomen x-ray tomorrow. abdomin awakeawake,[facility]   PATIENT NAME: Blake MangesBobby White    MR#:  [id number]  DATE OF BIRTH:  02/13/1934  SUBJECTIVE: Seen today, patient feels better today, no shortness of breath, no chest pain. Legs are wrapped with unna boots.. .  CHIEF COMPLAINT:   Chief Complaint  Patient presents with  . Fall  . Weakness  . Diarrhea     REVIEW OF SYSTEMS:   Review of Systems  Constitutional: Negative for fever, chills, malaise/fatigue and diaphoresis.  HENT: Negative for hearing loss.   Eyes: Negative for blurred vision, double vision and photophobia.  Respiratory: Negative for cough, hemoptysis and shortness of breath.   Cardiovascular: Positive for leg swelling. Negative for chest pain, palpitations and orthopnea.  Gastrointestinal: Negative for nausea, vomiting, abdominal pain, diarrhea and blood in stool.  Genitourinary: Negative for dysuria and urgency.  Musculoskeletal: Negative for myalgias and neck pain.  Skin: Negative for rash.  Neurological: Negative for dizziness, tingling, focal weakness, seizures, weakness and headaches.  Psychiatric/Behavioral: Negative for memory loss. The patient does not have insomnia.     DRUG ALLERGIES:  No Known Allergies  VITALS:  Blood pressure 108/56, pulse 56, temperature 98.2 F (36.8 C), temperature source Oral, resp. rate 18, height 5\' 8"  (1.727 m), weight 103.103 kg (227 lb 4.8 oz), SpO2 100 %.  PHYSICAL EXAMINATION:  GENERAL:  81 y.o.-year-old patient lying in the bed, no distress LUNGS: Normal breath sounds bilaterally, no wheezing, rales,rhonchi or crepitation. No use of accessory muscles of respiration.  CARDIOVASCULAR: S1, S2 normal. No murmurs, rubs, or gallops.  ABDOMEN: Soft, nontender, distended, tympanic.  Bowel sounds present. No organomegaly or mass.  EXTREMITIES: Bilateral pedal edema present.Marland Kitchen. NEUROLOGIC: Cranial nerves II through XII are intact. Muscle strength 5/5 in all extremities. Sensation intact. Gait not checked.  PSYCHIATRIC: Sleepy, oriented, calm  SKIN: Large skin tear over left forearm   LABORATORY PANEL:   CBC  Recent Labs Lab 03/22/15 0653  WBC 7.4  HGB 10.8*  HCT 32.7*  PLT 175   ------------------------------------------------------------------------------------------------------------------  Chemistries   Recent Labs Lab 03/17/15 0524  03/23/15 0435  NA 131*  < > 134*  K 4.1  < > 3.5  CL 103  < > 106  CO2 23  < > 23  GLUCOSE 102*  < > 100*  BUN 77*  < > 84*  CREATININE 2.89*  < > 2.46*  CALCIUM 9.1  < > 8.7*  AST 38  --   --   ALT 27  --   --   ALKPHOS 70  --   --   BILITOT 0.5  --   --   < > = values in this interval not displayed. ------------------------------------------------------------------------------------------------------------------  Cardiac Enzymes No results for input(s): TROPONINI in the last 168 hours. ------------------------------------------------------------------------------------------------------------------  RADIOLOGY:  No results found.  EKG:   Orders placed or performed during the hospital encounter of 03/13/15  . ED EKG  . ED EKG  . EKG 12-Lead  . EKG 12-Lead    ASSESSMENT AND PLAN:   Blake MangesBobby White is a 81 y.o. male with a known history of chronic atrial fibrillation, on Coumadin, congestive heart failure, hypothyroidism, chronic liver cirrhosis and recurrent C. difficile colitis was just admitted to the hospital and treated with by mouth vancomycin for C. difficile colitis. Admitted  for recurrent C. difficile colitis. Had  fall at home.  1) recurrent Clostridium difficile colitis - Appreciate GI consultation and consideration for fecal transplant - on high dose oral vanc,flagyl. - Will need to complete 2  weeks of vancomycin prior to fecal transplant. Stop date will be 11/20 Continue by mouth vancomycin Appreciate GI follow up   #2 acute on chronic kidney disease stage 4 - Due to ATN volume depletion, improving - Continue to avoid nephrotoxins - appreciate nephrology following  because of Worsening renal functions;so we stopped lasix yesterday and started gentle hydration  Functional function is stable from yesterday. IV hydration to be continued for another day.  #3 chronic systolic heart failure EF of 35%  failure:  Has  volume depletion;hold lasix today also,continue IV hydration for worsening renal failure #4 atrial fibrillation, chronic - Slight bradycardia ;decreased dose of amiodarone  Continue Coumadin per pharmacy, INR   5. Hypothyroidism - Continue Synthyroid  6. Generalized weakness with near syncope - PT eval  Recommends  SNF placement needed, wife chose offers at [facility], likely discharge tomorrow if stable. 7. Recent history of acute esophagitis - PPI  8. Left hip pain - no fracture on xray  9. Left forearm skin tear - WOC  10 dysuria. D/c  Foley today and monitor the  Post void  residual is more than 600 reinsert  the Foley. - UA negative for infection or hematuria 11.Hyponatremia: hypervolemic hypo-osmotic hyponatremia. Consistent with volume overload from hepatic cirrhosis and congestive heart failure systolic with EF of 30-35% (echo 01/17/15)  - Continue to hold metolazone. Hyponatremia improved. #12. Confusion due to metabolic encephalopathy and  dehydration;improved,back to baseline.CT head unremarkable.UA no evidence of infection.chest xray possible left lower lobe atelectasis  but he has no fever ,no hypoxia,so monitor closley. His mental status improved with IV hydration. . Stopped narcotics which can cause  AMS. son now had questions about patient's mental status and hydration. Dr. Cherylann Ratel has spoken to him. D/w wife today. All the records are  reviewed and case discussed with Care Management/Social Workerr. Management plans discussed with the patient, family and they are in agreement.  CODE STATUS: full  TOTAL TIME TAKING CARE OF THIS PATIENT: 25 minutes.   POSSIBLE D/C IN 2-3 DAYS, DEPENDING ON CLINICAL CONDITION.   Katha Hamming M.D on 03/23/2015 at 1:53 PM  Between 7am to 6pm - Pager - [phone]  After 6pm go to [email or web address] - password [email or web address] [facility]  [facility]  Office  [phone]  CC: Primary care physician; Sula Rumple, MD

## 2015-03-24 LAB — PROTIME-INR
INR: 2.56
Prothrombin Time: 27.2 seconds — ABNORMAL HIGH (ref 11.4–15.0)

## 2015-03-24 MED ORDER — FAMOTIDINE 20 MG PO TABS: 20.0000 mg | ORAL_TABLET | Freq: Every day | ORAL | Status: AC

## 2015-03-24 MED ORDER — WARFARIN SODIUM 2 MG PO TABS: 2.0000 mg | ORAL_TABLET | Freq: Every day | ORAL | Status: AC

## 2015-03-24 MED ORDER — VANCOMYCIN 50 MG/ML ORAL SOLUTION: 500.0000 mg | Freq: Four times a day (QID) | ORAL | Status: DC

## 2015-03-24 NOTE — Consult Note (Signed)
ANTICOAGULATION CONSULT NOTE - Follow Up Consult  Pharmacy Consult for warfarin Indication: VTE prophylaxis  No Known Allergies  Patient Measurements: Height: 5\' 8"  (172.7 cm) Weight: 227 lb 4.8 oz (103.103 kg) IBW/kg (Calculated) : 68.4  Vital Signs: Temp: 97.6 F (36.4 C) (11/16 0434) Temp Source: Oral (11/16 0434) BP: 120/66 mmHg (11/16 0434) Pulse Rate: 57 (11/16 0434)  Labs:  Recent Labs  03/21/15 0740 03/22/15 0653 03/23/15 0435 03/24/15 0521  HGB 11.9* 10.8*  --   --   HCT 36.0* 32.7*  --   --   PLT 177 175  --   --   LABPROT 30.3* 31.6* 29.3* 27.2*  INR 2.96 3.13 2.83 2.56  CREATININE 2.67* 2.41* 2.46*  --     Estimated Creatinine Clearance: 28.3 mL/min (by C-G formula based on Cr of 2.46).   Medications:  Scheduled:  . amiodarone  200 mg Oral Daily  . aspirin EC  81 mg Oral Daily  . atorvastatin  20 mg Oral QHS  . cholecalciferol  1,000 Units Oral Daily  . famotidine  20 mg Oral Daily  . levothyroxine  150 mcg Oral QAC breakfast  . midodrine  5 mg Oral TID WC  . saccharomyces boulardii  250 mg Oral BID  . vancomycin  500 mg Oral 4 times per day  . warfarin  2 mg Oral q1800  . Warfarin - Pharmacist Dosing Inpatient   Does not apply q1800    Assessment: Pt is a 81 year old male who presents to the ED after a fall. Pt has had several days of diarrhea, following cdiff tx a few weeks ago. Pts scans are negative for a bleed due to his fall. INR on arrival is subtherapeutic at 1.68. Pt home dose is 3mg  daily. Pt is on warfarin for Afib.  Goal of Therapy:  INR 2-3 Monitor platelets by anticoagulation protocol: Yes   Plan:  Since INR is subtherapeutic will give 4mg  tonight. Pts current illness/potentially being restarted on antibiotics both have potential to raise INR. Will monitor closely. Will recheck INR in the AM.  11/6 INR 2.03. Restart home dose of 3 mg daily. INR in AM.  11/7 INR 2.59. Continue 3 mg daily. Daily INR ordered while pt is on  antibiotics.  11/8 INR 2.84. Continue 3mg  daily. Monitor INR daily.  11/9 INR 2.91 Continue 3mg  daily. INR trending up, likely to be supratherapeutic tomorrow and require dose reduction.  11/10 INR 3.00. Decrease dose to 2.5mg  daily, however expecting INR to be elevated tomorrow. Recheck INR in am.  11/11 INR 2.83. Continue with decreased dose of 2.5mg  daily. Will follow up on INR tomorrow.  11/12 INR: 2.81. Continue with warfarin 2.5 mg Po daily. Will follow up on INR tomorrow.  Continue to monitor daily..  11/13: INR: 2.96. Continue warfarin 2.5 mg PO daily. Will follow up on INR tomorrow.   11/14: INR 3.13 INR is slightly supratherapeutic. Will decrease dose to 2mg  daily. Recheck INR in the AM  11/15 INR 2.83. Continue current regimen. INR in AM.  11/16 INR 2.56. Continue current regimen. INR in AM.  Dereon Corkery S 03/24/2015,6:32 AM

## 2015-03-24 NOTE — Consult Note (Signed)
WOC wound consult note Reason for Consult: Replace compression.  Unnas boots for chronic venous stasis.  Wound type:Chronic venous insufficiency Pressure Ulcer POA: [name] Measurement:Bilateral lower legs are intact.  Chronic skin changes noted.  Legs are cleansed and moisturized Wound WJX:BJYNbed:None Drainage (amount, consistency, odor) None Periwound:Edema is improved, hemosiderin staining noted.  Dressing procedure/placement/frequency:Cleanse legs with soap and water.  Apply zinc layer and secure with Coban.  Change twice weekly.  Will not follow at this time.  Please re-consult if needed.  Maple HudsonKaren Braylynn Ghan RN BSN CWON Pager [phone]

## 2015-03-24 NOTE — Progress Notes (Signed)
Nutrition Follow-up  DOCUMENTATION CODES:      INTERVENTION:  Meals and snacks: Cater to pt preferences Medical Nutrition Supplement Therapy: continue mightyshake for added nutrition   NUTRITION DIAGNOSIS:   Inadequate oral intake related to altered GI function as evidenced by per patient/family report, improved    GOAL:   Patient will meet greater than or equal to 90% of their needs  Progressing towards meeting goals  MONITOR:    (Energy Intake, Gastrointestinal Profile, Electrolyte and renal Profile, Anthropometrics)  REASON FOR ASSESSMENT:   LOS    ASSESSMENT:   Pt admitted with recurrent colitis d/t c.diff. Pt remains on isolation. Per GI MD note, plan for fecal transplant outpatient.   Pt hoping to be discharged today.   Current Nutrition: ate 100% of breakfast this am and mightyshake, tray observed.  Pt reports eating well and tolerating meals.  I and O sheet intake on average 63% of meals consumed   Gastrointestinal Profile: Last BM: 11/15   Scheduled Medications:  . amiodarone  200 mg Oral Daily  . aspirin EC  81 mg Oral Daily  . atorvastatin  20 mg Oral QHS  . cholecalciferol  1,000 Units Oral Daily  . famotidine  20 mg Oral Daily  . levothyroxine  150 mcg Oral QAC breakfast  . midodrine  5 mg Oral TID WC  . saccharomyces boulardii  250 mg Oral BID  . vancomycin  500 mg Oral 4 times per day  . warfarin  2 mg Oral q1800  . Warfarin - Pharmacist Dosing Inpatient   Does not apply q1800     Electrolyte/Renal Profile and Glucose Profile:   Recent Labs Lab 03/21/15 0740 03/22/15 0653 03/23/15 0435  NA 131* 134* 134*  K 4.0 3.5 3.5  CL 100* 103 106  CO2 24 25 23   BUN 90* 86* 84*  CREATININE 2.67* 2.41* 2.46*  CALCIUM 9.4 8.9 8.7*  GLUCOSE 103* 95 100*     Weight Trend since Admission: Filed Weights   03/22/15 0452 03/23/15 0500 03/24/15 0500  Weight: 226 lb 12.8 oz (102.876 kg) 227 lb 4.8 oz (103.103 kg) 229 lb 3.2 oz (103.964 kg)       Diet Order:  Diet Heart Room service appropriate?: Yes; Fluid consistency:: Thin  Skin:   reviewed   Height:   Ht Readings from Last 1 Encounters:  03/13/15 5\' 8"  (1.727 m)    Weight:   Wt Readings from Last 1 Encounters:  03/24/15 229 lb 3.2 oz (103.964 kg)    Ideal Body Weight:     BMI:  Body mass index is 34.86 kg/(m^2).  Estimated Nutritional Needs:   Kcal:  using IBW of 70kg, BEE: 1384kcals, TEE: (IF 1.1-1.3)(AF 1.2) 1827-2160kcals  Protein:  70-84g protein (1.0-1.2gKg)  Fluid:  1750-211900mL of lfuid (25-9330mL/kg)   EDUCATION NEEDS:   No education needs identified at this time  LOW Care Level  Paralee Pendergrass B. Freida BusmanAllen, RD, LDN [phone] (pager)

## 2015-03-24 NOTE — Discharge Summary (Signed)
Blake White, is a 81 y.o. male  DOB 1933/09/23  MRN [id number].  Admission date:  03/13/2015  Admitting Physician  Ramonita Lab, MD  Discharge Date:  03/24/2015   Primary MD  Sula Rumple, MD  Recommendations for primary care physician for things to follow:   Follow-up with Dr. Mechele Collin in 5 days for possible tone stool transplant Follow up with [facility] nephrology early next week regarding close monitoring of kidney function Follow-up with primary doctor in 1 week.    Admission Diagnosis  Diarrhea of presumed infectious origin [A09] Contusion [T14.8] Closed head injury, initial encounter [S09.90XA] Skin tear of elbow without complication, left, initial encounter [S51.012A]   Discharge Diagnosis  Diarrhea of presumed infectious origin [A09] Contusion [T14.8] Closed head injury, initial encounter [S09.90XA] Skin tear of elbow without complication, left, initial encounter [S51.012A]    Active Problems:   Recurrent colitis due to Clostridium difficile   Diarrhea of presumed infectious origin   C. difficile colitis      Past Medical History  Diagnosis Date  . CHF (congestive heart failure) (HCC)   . BPH (benign prostatic hyperplasia)   . A-fib (HCC)   . Thyroid disease   . High cholesterol   . Bilateral lower extremity edema   . CAD (coronary artery disease)   . Cirrhosis (HCC)   . CVA (cerebral vascular accident) (HCC)   . Gout   . Hypothyroidism   . Myocardial infarction (HCC)   . Obesity     Past Surgical History  Procedure Laterality Date  . Vein bypass surgery    . Vascular surgery    . Cardiac surgery      quad bypass  . Esophagogastroduodenoscopy (egd) with propofol [name] 03/04/2015    Procedure: ESOPHAGOGASTRODUODENOSCOPY (EGD) WITH PROPOFOL;  Surgeon: Elnita Maxwell, MD;  Location: [facility]  ENDOSCOPY;  Service: Endoscopy;  Laterality: [name];       History of present illness and  Hospital Course:     Kindly see H&P for history of present illness and admission details, please review complete Labs, Consult reports and Test reports for all details in brief  HPI  from the history and physical done on the day of admission  81 year old male patient with chronic atrial fibrillation on Coumadin, chronic systolic heart failure with EF of 35%, liver cirrhosis, hypothyroidism came in secondary to generalized weakness, ongoing diarrhea, fall. Patient also noted to be confused when he came. He noted to have a skin tear on the left arm. He also has chronic lower extremity edema secondary to chronic venous stasis. On admission also was hypotensive with the blood pressure 93/62, found to have altered mental status secondary to recurrent C. difficile colitis.    Hospital Course  #1. Recurrent C. difficile colitis: Seen by gastroenterology Dr. Mechele Collin. Patient received high-dose of vancomycin at 500 mg every 6 hours, continued on probiotics. Diarrhea decreased. Patient kept in isolation. On is to continue vancomycin 500 mg every 6 hours for for 2 weeks which will be finished on November 20. Patient needs follow-up with Dr. Mechele Collin as an outpatient for possible stool transplant. Diarrhea resolved. He is tolerating the diet.  #2: Somnolent secondary to acute on chronic renal failure, dehydration, infection, narcotic induced. Patient CT of the head did not show acute stroke. Patient is seen by neurology Dr. Toney Reil did not offer further [email or web address] dyspnea that is multifactorial due to infection, renal failure/patient's mental status improved with hydration. He is completely alert oriented following commands.  3. history of fall and left hip pain: Patient had no fractures on x-ray.  #4. history of fall recent EGD showing esophagitis but no esophageal varices. Continue PPIs.  #5 .acute on chronic  renal failure, chronic kidney disease stage III: Patient had ATN secondary to volume depletion due to C. difficile colitis and diarrhea. Patient received gentle hydration. Seen by nephrology, patient also had a Foley catheter to monitor urine output. Patient renal function improved and baseline creatinine 2.78. [facility] as an [facility]. Patient started on now diuretics on now for 11th and we stopped the IV fluids. Patient was given Lasix because of increasing peripheral edema. However patient became dehydrated, confused, patient had dehydration with BUN of up to 82 creatinine of 3 so Lasix was stopped and patient received gentle hydration as per nephrology recommendation: Patient felt much better, his confusion resolved, his BUN and creatinine decreased ,creatinine is 2.46 which is actually below his baseline. So we stopped the IV hydration yesterday. I have discussed with Dr. months ago. He suggested restarting the Lasix at 40 mg a discharge but continue to hold metolazone, spironolactone. [facility] in less than a week to monitor kidney function.   6. Acute on chronic systolic heart failure EF of 30-35%. Lasix restarted. Advised nursing home staff to check daily weights, salt assisted diet. Patient Foley was discontinued and he is able to void. #7. hypertension blood pressure is better patient is on midodrine continue that #8 hyponatremia secondary to high post medical hyponatremia improved patient had a complicated picture with history of CHF, hepatic cirrhosis. Murmur 9 chronic atrial fibrillation: Heart rate is controlled. The patient had bradycardia with heart rate of low as 50s so I adjusted the amiodarone. Continued on Coumadin, pharmacy manage her Coumadin. #9 bilateral chronic venous stasis in the Lasix and edema. Patient is seen by wound care nurse Clydie BraunKaren, he has Unna boots, Apply Zinc,secure with coban ,l change it twice weekly. Clean the  legs with soap and water. #10 generalized weakness, fall at home: Patient is seen by physical therapy, patient will go to SNF, family [facility].  #11 metabolic encephalopathy secondary to infection, acute on chronic renal failure. Mental status improved. We have stopped the oxycodone which was given initially due to hip pain and fall. Patient is very sensitive to narcotics. After stopping the oxycodone mental status improved.   Patient's family had a lot of questions which we addressed them to the best of our knowledge.    Discharge Condition:stable   Follow UP  Follow-up Information    Follow up with [facility].   Specialty:  Skilled Nursing Facility   Contact information:   [address] [place] [id number] [phone]      Follow up with [name], Charanjit, MD In 1 week.   Specialty:  Family Medicine   Contact information:   [address] [place] [id number] [phone]       [facility] In 3 days.   Why:  needs closemonitoring of  kidney fucntion      Follow up with Lynnae PrudeELLIOTT, ROBERT, MD.   Specialty:  Gastroenterology   Why:  for follow  up of recurrent cdiff colitis and possible stool transplant   Contact information:   [address] [facility] [place] [id number] [phone]         Discharge Instructions  and  Discharge Medications  Medication List    STOP taking these medications        metolazone 5 MG tablet  Commonly known as:  ZAROXOLYN     spironolactone 100 MG tablet  Commonly known as:  ALDACTONE      TAKE these medications        allopurinol 300 MG tablet  Commonly known as:  ZYLOPRIM  Take 150 mg by mouth daily.     amiodarone 200 MG tablet  Commonly known as:  PACERONE  Take 200 mg by mouth daily.     aspirin EC 81 MG tablet  Take 81 mg by mouth daily.     famotidine 20 MG tablet  Commonly known as:   PEPCID  Take 1 tablet (20 mg total) by mouth daily.     furosemide 40 MG tablet  Commonly known as:  LASIX  Take 40 mg by mouth daily.     levothyroxine 150 MCG tablet  Commonly known as:  SYNTHROID, LEVOTHROID  Take 150 mcg by mouth daily before breakfast.     midodrine 5 MG tablet  Commonly known as:  PROAMATINE  Take 1 tablet (5 mg total) by mouth 3 (three) times daily with meals.     saccharomyces boulardii 250 MG capsule  Commonly known as:  FLORASTOR  Take 1 capsule (250 mg total) by mouth 2 (two) times daily.     simvastatin 40 MG tablet  Commonly known as:  ZOCOR  Take 40 mg by mouth at bedtime.     vancomycin 50 mg/mL oral solution  Commonly known as:  VANCOCIN  Take 10 mLs (500 mg total) by mouth every 6 (six) hours.     Vitamin D3 1000 UNITS Caps  Take 1,000 Units by mouth daily.     warfarin 2 MG tablet  Commonly known as:  COUMADIN  Take 1 tablet (2 mg total) by mouth daily at 6 PM.          Diet and Activity recommendation: See Discharge Instructions above   Consults obtained - nephrology GI Physical therapy   Major procedures and Radiology Reports - PLEASE review detailed and final reports for all details, in brief -      Dg Chest 2 View  03/13/2015  CLINICAL DATA:  Weakness since yesterday.  Extremity edema. EXAM: CHEST  2 VIEW COMPARISON:  02/19/2015 FINDINGS: There is mild bilateral interstitial thickening. There is a trace left pleural effusion. There is no focal parenchymal opacity. There is no pneumothorax. There is stable cardiomegaly. There is evidence of prior CABG. There is thoracic aortic atherosclerosis. The osseous structures are unremarkable. IMPRESSION: Findings most concerning for mild CHF. Electronically Signed   By: Elige Ko   On: 03/13/2015 17:49   Ct Head Wo Contrast  03/20/2015  CLINICAL DATA:  Confusion. EXAM: CT HEAD WITHOUT CONTRAST TECHNIQUE: Contiguous axial images were obtained from the base of the skull through  the vertex without intravenous contrast. COMPARISON:  03/13/2015 FINDINGS: Ventricles are normal in configuration. There is ventricular and sulcal enlargement reflecting mild to moderate atrophy. There are no parenchymal masses or mass effect. There are patchy areas of white matter hypoattenuation consistent with moderate chronic microvascular ischemic change. There is no evidence of a cortical infarct. There are no extra-axial masses or abnormal fluid collections. Small amount of fat along the anterior falx cerebri, stable. No intracranial hemorrhage. Visualized sinuses and mastoid air cells are clear. IMPRESSION: 1. No acute intracranial abnormalities. Stable appearance from the recent prior exam. Electronically  Signed   By: Amie Portland M.D.   On: 03/20/2015 13:13   Ct Head Wo Contrast  03/13/2015  CLINICAL DATA:  81 year old male with weakness since yesterday. History of fall yesterday. Prior history of stroke. EXAM: CT HEAD WITHOUT CONTRAST CT CERVICAL SPINE WITHOUT CONTRAST TECHNIQUE: Multidetector CT imaging of the head and cervical spine was performed following the standard protocol without intravenous contrast. Multiplanar CT image reconstructions of the cervical spine were also generated. COMPARISON:  Head CT 07/10/2014. FINDINGS: CT HEAD FINDINGS Mild cerebral atrophy. Patchy and confluent areas of decreased attenuation are noted throughout the deep and periventricular white matter of the cerebral hemispheres bilaterally, compatible with chronic microvascular ischemic disease. Fatty infiltration along the anterior aspect of the falx cerebri again noted, without a well-defined lipoma. No acute displaced skull fractures are identified. No acute intracranial abnormality. Specifically, no evidence of acute post-traumatic intracranial hemorrhage, no definite regions of acute/subacute cerebral ischemia, no focal mass, mass effect, hydrocephalus or abnormal intra or extra-axial fluid collections. The  visualized paranasal sinuses and mastoids are well pneumatized. CT CERVICAL SPINE FINDINGS No acute displaced fracture of the cervical spine. Straightening of normal cervical lordosis. Alignment is otherwise anatomic. Prevertebral soft tissues are normal. Multilevel degenerative disc disease, most severe at C6-C7. Multilevel facet arthropathy. Visualized portions of the upper thorax demonstrates a left pleural effusion. IMPRESSION: 1. No evidence of significant acute traumatic injury to the skull, brain or cervical spine. 2. Left pleural effusion incidentally imaged in the upper left hemithorax. Correlation with chest radiograph is recommended. 3. Mild cerebral atrophy with extensive chronic microvascular ischemic changes in the cerebral white matter. 4. Mild multilevel degenerative disc disease and cervical spondylosis, as above. Electronically Signed   By: Trudie Reed M.D.   On: 03/13/2015 17:49   Ct Cervical Spine Wo Contrast  03/13/2015  CLINICAL DATA:  81 year old male with weakness since yesterday. History of fall yesterday. Prior history of stroke. EXAM: CT HEAD WITHOUT CONTRAST CT CERVICAL SPINE WITHOUT CONTRAST TECHNIQUE: Multidetector CT imaging of the head and cervical spine was performed following the standard protocol without intravenous contrast. Multiplanar CT image reconstructions of the cervical spine were also generated. COMPARISON:  Head CT 07/10/2014. FINDINGS: CT HEAD FINDINGS Mild cerebral atrophy. Patchy and confluent areas of decreased attenuation are noted throughout the deep and periventricular white matter of the cerebral hemispheres bilaterally, compatible with chronic microvascular ischemic disease. Fatty infiltration along the anterior aspect of the falx cerebri again noted, without a well-defined lipoma. No acute displaced skull fractures are identified. No acute intracranial abnormality. Specifically, no evidence of acute post-traumatic intracranial hemorrhage, no definite  regions of acute/subacute cerebral ischemia, no focal mass, mass effect, hydrocephalus or abnormal intra or extra-axial fluid collections. The visualized paranasal sinuses and mastoids are well pneumatized. CT CERVICAL SPINE FINDINGS No acute displaced fracture of the cervical spine. Straightening of normal cervical lordosis. Alignment is otherwise anatomic. Prevertebral soft tissues are normal. Multilevel degenerative disc disease, most severe at C6-C7. Multilevel facet arthropathy. Visualized portions of the upper thorax demonstrates a left pleural effusion. IMPRESSION: 1. No evidence of significant acute traumatic injury to the skull, brain or cervical spine. 2. Left pleural effusion incidentally imaged in the upper left hemithorax. Correlation with chest radiograph is recommended. 3. Mild cerebral atrophy with extensive chronic microvascular ischemic changes in the cerebral white matter. 4. Mild multilevel degenerative disc disease and cervical spondylosis, as above. Electronically Signed   By: Trudie Reed M.D.   On: 03/13/2015 17:49   Dg Chest  Port 1 View  03/18/2015  CLINICAL DATA:  Shortness of breath with history of kidney failure. EXAM: PORTABLE CHEST 1 VIEW COMPARISON:  03/13/2015 FINDINGS: Sternotomy wires are intact. Lordotic technique is demonstrated as the lungs are adequately inflated with minimal linear density of the left base likely atelectasis and possible small amount of left pleural fluid. Stable cardiomegaly. Remainder of the exam is unchanged. IMPRESSION: Stable left base opacification likely small effusion with atelectasis. Mild stable cardiomegaly. Electronically Signed   By: Elberta Fortis M.D.   On: 03/18/2015 15:21   Dg Abd 2 Views  03/21/2015  CLINICAL DATA:  Followup adynamic ileus. Patient with a history of C-difficile. EXAM: ABDOMEN - 2 VIEW COMPARISON:  03/19/2015 FINDINGS: There is increased gas throughout the bowel including small bowel and colon, but no significant  bowel dilation. No significant air-fluid levels. No free air. Calcifications are noted along the aorta and iliac and femoral arteries. Soft tissues otherwise unremarkable. Bones are demineralized degenerative changes of the lumbar spine. IMPRESSION: 1. Increased bowel gas diffusely, with no significant bowel dilation or air-fluid levels on the erect view. Findings may still reflect a mild adynamic ileus. There is no evidence of obstruction no free air. Electronically Signed   By: Amie Portland M.D.   On: 03/21/2015 13:00   Dg Abd 2 Views  03/19/2015  CLINICAL DATA:  81 year old male with abdominal pain, distention, and diarrhea after fall. Initial encounter. EXAM: ABDOMEN - 2 VIEW COMPARISON:  Portable chest radiograph 01/16/2015. FINDINGS: Upright and supine views of the abdomen and pelvis. No pneumoperitoneum. Curvilinear opacity at the left lung base most resembles atelectasis. Stable visualized mediastinal contours. Previous CABG. Redundant colon with widespread large bowel gaseous distension. Retained stool in the right abdomen. Gas-filled but nondilated small bowel loops throughout the visible abdomen. The large bowel gas continues into the pelvis. Cholecystectomy clips. Left inguinal surgical clips. Pelvic phleboliths. No acute osseous abnormality identified. IMPRESSION: 1. Gas throughout small and redundant large bowel loops most suggestive of diffuse ileus. 2. No abdominal free air. 3. Left lower lobe atelectasis. Electronically Signed   By: Odessa Fleming M.D.   On: 03/19/2015 08:36   Dg Hip Unilat With Pelvis 2-3 Views Left  03/15/2015  CLINICAL DATA:  Patient fell from standing 3 days ago onto his left side. Complaining of left hip pain. Unable to rotate leg. EXAM: DG HIP (WITH OR WITHOUT PELVIS) 2-3V LEFT COMPARISON:  None. FINDINGS: No fracture or bone lesion. Hip joints, SI joints and symphysis pubis are normally aligned. No significant hip joint arthropathic change. Bones are demineralized. There are  iliac and femoral vascular calcifications. IMPRESSION: No fracture or dislocation. Electronically Signed   By: Amie Portland M.D.   On: 03/15/2015 12:56    Micro Results    Recent Results (from the past 240 hour(s))  Urine culture     Status: None   Collection Time: 03/14/15  6:35 PM  Result Value Ref Range Status   Specimen Description URINE, RANDOM  Final   Special Requests NONE  Final   Culture NO GROWTH 1 DAY  Final   Report Status 03/16/2015 FINAL  Final  Stool culture     Status: None   Collection Time: 03/15/15  1:49 AM  Result Value Ref Range Status   Specimen Description STOOL  Final   Special Requests NONE  Final   Culture   Final    NO CAMPYLOBACTER DETECTED NO SALMONELLA OR SHIGELLA ISOLATED No Pathogenic E. coli detected  Report Status 03/18/2015 FINAL  Final       Today   Subjective:   Blake White today has no headache,no chest abdominal pain,no new weakness tingling or numbness, eating well,feels stronger,discharge today to SNF. D/w family wife and Son yesterday.  Objective:   Blood pressure 120/66, pulse 57, temperature 97.6 F (36.4 C), temperature source Oral, resp. rate 18, height  (1.727 m), weight 103.964 kg (229 lb 3.2 oz), SpO2 100 %.   Intake/Output Summary (Last 24 hours) at 03/24/15 0925 Last data filed at 03/24/15 0750  Gross per 24 hour  Intake    240 ml  Output      0 ml  Net    240 ml    Exam Awake Alert, Oriented x 3, No new F.N deficits, Normal affect [place].AT,PERRAL Supple Neck,No JVD, No cervical lymphadenopathy appriciated.  Symmetrical Chest wall movement, Good air movement bilaterally, CTAB RRR,No Gallops,Rubs or new Murmurs, No Parasternal Heave +ve B.Sounds, Abd Soft, Non tender, No organomegaly appriciated, No rebound -guarding or rigidity. No Cyanosis, Clubbing or edema, No new Rash or bruise  Data Review   CBC w Diff: Lab Results  Component Value Date   WBC 7.4 03/22/2015   WBC 5.4 01/22/2012   HGB 10.8*  03/22/2015   HGB 14.5 01/22/2012   HCT 32.7* 03/22/2015   HCT 44.7 01/22/2012   PLT 175 03/22/2015   PLT 101* 01/22/2012   LYMPHOPCT 14 02/13/2015   LYMPHOPCT 29.5 01/22/2012   MONOPCT 12 02/13/2015   MONOPCT 11.6 01/22/2012   EOSPCT 2 02/13/2015   EOSPCT 2.5 01/22/2012   BASOPCT 1 02/13/2015   BASOPCT 1.2 01/22/2012    CMP: Lab Results  Component Value Date   NA 134* 03/23/2015   K 3.5 03/23/2015   CL 106 03/23/2015   CO2 23 03/23/2015   BUN 84* 03/23/2015   CREATININE 2.46* 03/23/2015   CREATININE 2.92* 01/22/2012   PROT 5.3* 03/17/2015   ALBUMIN 2.2* 03/17/2015   ALBUMIN 3.9 07/11/2011   BILITOT 0.5 03/17/2015   ALKPHOS 70 03/17/2015   AST 38 03/17/2015   ALT 27 03/17/2015  .   Total Time in preparing paper work, data evaluation and todays exam - 35 minutes  Justine Dines M.D on 03/24/2015 at 9:25 AM    Note: This dictation was prepared with Dragon dictation along with smaller phrase technology. Any transcriptional errors that result from this process are unintentional.

## 2015-03-24 NOTE — Care Management Important Message (Signed)
Important Message  Patient Details  Name: Blake White MRN: [id number] Date of Birth: 05/15/1933   Medicare Important Message Given:  Yes    [name], Maurice Fotheringham L, RN 03/24/2015, 10:33 AM

## 2015-03-24 NOTE — Progress Notes (Signed)
Subjective:  Last Cr was 2.4. Pt breathing comfortably. Discussed case with wife at bedside. Still overall weak and will need physical rehabilitation   Objective:  Vital signs in last 24 hours:  Temp:  [97.6 F (36.4 C)-97.9 F (36.6 C)] 97.6 F (36.4 C) (11/16 0434) Pulse Rate:  [54-57] 57 (11/16 0434) Resp:  [18] 18 (11/15 2005) BP: (118-120)/(62-66) 120/66 mmHg (11/16 0434) SpO2:  [99 %-100 %] 100 % (11/16 0434) Weight:  [103.964 kg (229 lb 3.2 oz)] 103.964 kg (229 lb 3.2 oz) (11/16 0500)  Weight change: 0.862 kg (1 lb 14.4 oz) Filed Weights   03/22/15 0452 03/23/15 0500 03/24/15 0500  Weight: 102.876 kg (226 lb 12.8 oz) 103.103 kg (227 lb 4.8 oz) 103.964 kg (229 lb 3.2 oz)    Intake/Output:    Intake/Output Summary (Last 24 hours) at 03/24/15 1552 Last data filed at 03/24/15 1230  Gross per 24 hour  Intake    360 ml  Output      0 ml  Net    360 ml     Physical Exam: General: NAD  HEENT Anicteric, moist oral mucosal membranes  Neck supple  Pulm/lungs Normal effort, clear ant and laterally  CVS/Heart S1S2 no rubs  Abdomen:  Soft, distended, BS present  Extremities: ++ dependent edema, LLE wrapped  Neurologic: Awake, alert to place/person, follows commands  Skin: Left olecrenon laceration, in dressings           Basic Metabolic Panel:   Recent Labs Lab 03/19/15 0501 03/20/15 0453 03/21/15 0740 03/22/15 0653 03/23/15 0435  NA 131* 132* 131* 134* 134*  K 4.5 3.8 4.0 3.5 3.5  CL 98* 99* 100* 103 106  CO2 GLUCOSE 95 102* 103* 95 100*  BUN 77* 84* 90* 86* 84*  CREATININE 2.76* 2.81* 2.67* 2.41* 2.46*  CALCIUM 9.2 9.4 9.4 8.9 8.7*     CBC:  Recent Labs Lab 03/18/15 0610 03/19/15 0501 03/21/15 0740 03/22/15 0653  WBC 7.8 9.2 8.5 7.4  HGB 11.7* 11.9* 11.9* 10.8*  HCT 35.8* 36.0* 36.0* 32.7*  MCV 103.3* 102.3* 102.2* 101.3*  PLT 170 189 177 175      Microbiology:  Recent Results (from the past 720 hour(s))  C  difficile quick scan w PCR reflex     Status: Abnormal   Collection Time: 03/13/15  4:58 PM  Result Value Ref Range Status   C Diff antigen POSITIVE (A) NEGATIVE Final   C Diff toxin POSITIVE (A) NEGATIVE Final   C Diff interpretation   Final    Positive for toxigenic C. difficile, active toxin production present.    Comment: CRITICAL RESULT CALLED TO, READ BACK BY AND VERIFIED WITH: [facility] MOFFITT RN AT 1830 03/13/2015   Urine culture     Status: None   Collection Time: 03/14/15  6:35 PM  Result Value Ref Range Status   Specimen Description URINE, RANDOM  Final   Special Requests NONE  Final   Culture NO GROWTH 1 DAY  Final   Report Status 03/16/2015 FINAL  Final  Stool culture     Status: None   Collection Time: 03/15/15  1:49 AM  Result Value Ref Range Status   Specimen Description STOOL  Final   Special Requests NONE  Final   Culture   Final    NO CAMPYLOBACTER DETECTED NO SALMONELLA OR SHIGELLA ISOLATED No Pathogenic E. coli detected    Report Status 03/18/2015 FINAL  Final  Coagulation Studies:  Recent Labs  03/22/15 0653 03/23/15 0435 03/24/15 0521  LABPROT 31.6* 29.3* 27.2*  INR 3.13 2.83 2.56    Urinalysis: No results for input(s): COLORURINE, LABSPEC, PHURINE, GLUCOSEU, HGBUR, BILIRUBINUR, KETONESUR, PROTEINUR, UROBILINOGEN, NITRITE, LEUKOCYTESUR in the last 72 hours.  Invalid input(s): APPERANCEUR    Imaging: No results found.   Medications:     . amiodarone  200 mg Oral Daily  . aspirin EC  81 mg Oral Daily  . atorvastatin  20 mg Oral QHS  . cholecalciferol  1,000 Units Oral Daily  . famotidine  20 mg Oral Daily  . levothyroxine  150 mcg Oral QAC breakfast  . midodrine  5 mg Oral TID WC  . saccharomyces boulardii  250 mg Oral BID  . vancomycin  500 mg Oral 4 times per day  . warfarin  2 mg Oral q1800  . Warfarin - Pharmacist Dosing Inpatient   Does not apply q1800   acetaminophen **OR** acetaminophen  Assessment/ Plan:  81 y.o. white  male with congestive heart failure, atrial fibrillation, chronic anticoagulation, hypothyroidism, BPH, hyperlipidemia, hypertension, coronary artery disease status post CABG, Liver cirrhosis who [facility] for C. Diff colitis.   1. Acute kidney failure on chronic kidney disease stage IV. Baseline 2.78/GFR 20. [facility] as [facility].  -Renal function seems to now be at baseline.  Will need follow up of his renal function as an outpt given advanced renal dysfunction.  Pt expressed some interest in following up in our office.   2. Hyponatremia: hypervolemic hypo-osmotic hyponatremia. Originally had volume overload from hepatic cirrhosis and congestive heart failure systolic with EF of 30-35% (echo 01/17/15) - pt will need follow up of his Na as outpt given his underlying heart failure.  3. Acute CHF and Edema: with history of hepatic cirrhosis and systolic congestive heart failure EF 30-35% 01/17/15 - Resume lasix upon discharge.    4. Hypotension:  Discussed with Dr. Luberta MutterKonidena, continue midodrine.    5. Infective Colitis: C. Diff A09 - on PO vancomycin. - stool transplant being considered as outpt.     LOS: 11 Lysha Schrade 11/16/20163:52 PM

## 2015-03-26 ENCOUNTER — Encounter: Payer: Self-pay | Admitting: *Deleted

## 2015-03-29 ENCOUNTER — Encounter
Admission: RE | Disposition: A | Discharge status: Home / Self Care | Payer: Self-pay | Source: Ambulatory Visit | Attending: Unknown Physician Specialty

## 2015-03-29 ENCOUNTER — Encounter: Payer: Self-pay | Admitting: *Deleted

## 2015-03-29 ENCOUNTER — Ambulatory Visit: Payer: Medicare Other | Admitting: Anesthesiology

## 2015-03-29 ENCOUNTER — Ambulatory Visit
Admission: RE | Admit: 2015-03-29 | Discharge: 2015-03-29 | Disposition: A | Discharge status: Home / Self Care | Payer: Medicare Other | Source: Ambulatory Visit | Attending: Unknown Physician Specialty | Admitting: Unknown Physician Specialty

## 2015-03-29 DIAGNOSIS — Z951 Presence of aortocoronary bypass graft: Secondary | ICD-10-CM | POA: Diagnosis not present

## 2015-03-29 DIAGNOSIS — I252 Old myocardial infarction: Secondary | ICD-10-CM | POA: Diagnosis not present

## 2015-03-29 DIAGNOSIS — M109 Gout, unspecified: Secondary | ICD-10-CM | POA: Diagnosis not present

## 2015-03-29 DIAGNOSIS — I4891 Unspecified atrial fibrillation: Secondary | ICD-10-CM | POA: Insufficient documentation

## 2015-03-29 DIAGNOSIS — A047 Enterocolitis due to Clostridium difficile: Secondary | ICD-10-CM | POA: Diagnosis present

## 2015-03-29 DIAGNOSIS — Z8673 Personal history of transient ischemic attack (TIA), and cerebral infarction without residual deficits: Secondary | ICD-10-CM | POA: Diagnosis not present

## 2015-03-29 DIAGNOSIS — Z7901 Long term (current) use of anticoagulants: Secondary | ICD-10-CM | POA: Insufficient documentation

## 2015-03-29 DIAGNOSIS — E039 Hypothyroidism, unspecified: Secondary | ICD-10-CM | POA: Diagnosis not present

## 2015-03-29 DIAGNOSIS — E7800 Pure hypercholesterolemia, unspecified: Secondary | ICD-10-CM | POA: Insufficient documentation

## 2015-03-29 DIAGNOSIS — I2510 Atherosclerotic heart disease of native coronary artery without angina pectoris: Secondary | ICD-10-CM | POA: Diagnosis not present

## 2015-03-29 DIAGNOSIS — E669 Obesity, unspecified: Secondary | ICD-10-CM | POA: Insufficient documentation

## 2015-03-29 DIAGNOSIS — N529 Male erectile dysfunction, unspecified: Secondary | ICD-10-CM | POA: Insufficient documentation

## 2015-03-29 DIAGNOSIS — Z79899 Other long term (current) drug therapy: Secondary | ICD-10-CM | POA: Insufficient documentation

## 2015-03-29 DIAGNOSIS — K640 First degree hemorrhoids: Secondary | ICD-10-CM | POA: Diagnosis not present

## 2015-03-29 DIAGNOSIS — Z87891 Personal history of nicotine dependence: Secondary | ICD-10-CM | POA: Diagnosis not present

## 2015-03-29 DIAGNOSIS — Z7982 Long term (current) use of aspirin: Secondary | ICD-10-CM | POA: Diagnosis not present

## 2015-03-29 DIAGNOSIS — Z6831 Body mass index (BMI) 31.0-31.9, adult: Secondary | ICD-10-CM | POA: Insufficient documentation

## 2015-03-29 HISTORY — PX: FECAL TRANSPLANT: SHX6383

## 2015-03-29 HISTORY — DX: Male erectile dysfunction, unspecified: N52.9

## 2015-03-29 HISTORY — DX: Personal history of other infectious and parasitic diseases: Z86.19

## 2015-03-29 HISTORY — DX: Cardiac arrhythmia, unspecified: I49.9

## 2015-03-29 SURGERY — FECAL MICROBIOTA TRANSFER
Anesthesia: General

## 2015-03-29 MED ORDER — PROPOFOL 500 MG/50ML IV EMUL
INTRAVENOUS | Status: DC | PRN
  Administered 2015-03-29: 50 ug/kg/min via INTRAVENOUS

## 2015-03-29 MED ORDER — LACTATED RINGERS IV SOLN
INTRAVENOUS | Status: DC | PRN
Start: 2015-03-29 — End: 2015-03-29
  Administered 2015-03-29: 12:00:00 via INTRAVENOUS

## 2015-03-29 MED ORDER — SODIUM CHLORIDE 0.9 % IV SOLN
INTRAVENOUS | Status: DC
  Administered 2015-03-29: 11:00:00 via INTRAVENOUS

## 2015-03-29 MED ORDER — SODIUM CHLORIDE 0.9 % IV SOLN: INTRAVENOUS | Status: DC

## 2015-03-29 MED ORDER — PROPOFOL 10 MG/ML IV BOLUS
INTRAVENOUS | Status: DC | PRN
  Administered 2015-03-29: 20 mg via INTRAVENOUS

## 2015-03-29 MED ORDER — EPHEDRINE SULFATE 50 MG/ML IJ SOLN
INTRAMUSCULAR | Status: DC | PRN
  Administered 2015-03-29: 10 mg via INTRAVENOUS
  Administered 2015-03-29: 5 mg via INTRAVENOUS

## 2015-03-29 NOTE — Op Note (Signed)
[facility] Gastroenterology Patient Name: Blake White Procedure Date: 03/29/2015 11:13 AM MRN: [id number] Account #: [id number] Date of Birth: [birth year] Admit Type: Outpatient Age: 81 Room: [facility] ENDO ROOM 1 Gender: Male Note Status: Finalized Procedure:         Colonoscopy Indications:       Fecal transplant for treatment of Clostridium difficile                     diarrhea Providers:         Scot Jun, MD Referring MD:      Haynes Kerns (Referring MD) Medicines:         Propofol per Anesthesia Complications:     No immediate complications. Procedure:         Pre-Anesthesia Assessment:                    - After reviewing the risks and benefits, the patient was                     deemed in satisfactory condition to undergo the procedure.                    After obtaining informed consent, the colonoscope was                     passed under direct vision. Throughout the procedure, the                     patient's blood pressure, pulse, and oxygen saturations                     were monitored continuously. The Colonoscope was                     introduced through the anus and advanced to the the cecum,                     identified by appendiceal orifice and ileocecal valve. Findings:      Internal hemorrhoids were found. The hemorrhoids were small and Grade I       (internal hemorrhoids that do not prolapse).      There was a significant amount of stool in the colon and i washed a lot       to clear as much as possible. After reaching the cecum I then instilled       350cc of mixed stool in the right colon while the patient was in supine       position. the scope was withdrawn and patient tolerated procedure well.      Fecal Microbiota Transplant (Bacteriotherapy): Donor stool was prepared       by me using milk as per protocol. Approximately 350 mL of the emulsified       donor stool was instilled. A detailed colonoscopic exam could not  be       performed upon scope withdrawal secondary to limited visibility from the       instilled stool. Impression:        - Internal hemorrhoids.                    - Fecal Microbiota Transplant (Bacteriotherapy) performed.                    - No specimens collected. Recommendation:    -  The findings and recommendations were discussed with the                     patient's family. Scot Junobert T Roby Spalla, MD 03/29/2015 4:06:25 PM This report has been signed electronically. Number of Addenda: 0 Note Initiated On: 03/29/2015 11:13 AM Scope Withdrawal Time: 1 hour 8 minutes 59 seconds  Total Procedure Duration: 1 hour 24 minutes 32 seconds       Eliza Coffee [facility]

## 2015-03-29 NOTE — H&P (Signed)
Primary Care Physician:  Sula Rumple, MD Primary Gastroenterologist:  Dr. Mechele Collin  Pre-Procedure History & Physical: HPI:  Blake White is a 81 y.o. male is here for an colonoscopy.   Past Medical History  Diagnosis Date  . CHF (congestive heart failure) (HCC)   . BPH (benign prostatic hyperplasia)   . A-fib (HCC)   . Thyroid disease   . High cholesterol   . Bilateral lower extremity edema   . CAD (coronary artery disease)   . Cirrhosis (HCC)   . CVA (cerebral vascular accident) (HCC)   . Gout   . Hypothyroidism   . Myocardial infarction (HCC)   . Obesity   . Dysrhythmia   . History of chickenpox   . ED (erectile dysfunction)     Past Surgical History  Procedure Laterality Date  . Vein bypass surgery    . Vascular surgery    . Cardiac surgery      quad bypass  . Esophagogastroduodenoscopy (egd) with propofol [name] 03/04/2015    Procedure: ESOPHAGOGASTRODUODENOSCOPY (EGD) WITH PROPOFOL;  Surgeon: Elnita Maxwell, MD;  Location: [facility] ENDOSCOPY;  Service: Endoscopy;  Laterality: [name];  . Coronary artery bypass graft      Prior to Admission medications   Medication Sig Start Date End Date Taking? Authorizing Provider  metolazone (ZAROXOLYN) 5 MG tablet Take 5 mg by mouth daily.   Yes Historical Provider, MD  spironolactone (ALDACTONE) 100 MG tablet Take 100 mg by mouth daily.   Yes Historical Provider, MD  allopurinol (ZYLOPRIM) 300 MG tablet Take 150 mg by mouth daily. 11/20/14   Historical Provider, MD  amiodarone (PACERONE) 200 MG tablet Take 200 mg by mouth daily. 10/20/14   Historical Provider, MD  aspirin EC 81 MG tablet Take 81 mg by mouth daily. 03/11/14   Historical Provider, MD  Cholecalciferol (VITAMIN D3) 1000 UNITS CAPS Take 1,000 Units by mouth daily. 03/11/14   Historical Provider, MD  famotidine (PEPCID) 20 MG tablet Take 1 tablet (20 mg total) by mouth daily. 03/24/15   Katha Hamming, MD  furosemide (LASIX) 40 MG tablet Take 40 mg by mouth  daily. 02/16/15   Historical Provider, MD  levothyroxine (SYNTHROID, LEVOTHROID) 150 MCG tablet Take 150 mcg by mouth daily before breakfast. 11/02/14   Historical Provider, MD  midodrine (PROAMATINE) 5 MG tablet Take 1 tablet (5 mg total) by mouth 3 (three) times daily with meals. 01/20/15   Ramonita Lab, MD  saccharomyces boulardii (FLORASTOR) 250 MG capsule Take 1 capsule (250 mg total) by mouth 2 (two) times daily. 02/19/15   Myrna Blazer, MD  simvastatin (ZOCOR) 40 MG tablet Take 40 mg by mouth at bedtime. 11/26/14   Historical Provider, MD  vancomycin (VANCOCIN) 50 mg/mL oral solution Take 10 mLs (500 mg total) by mouth every 6 (six) hours. 03/24/15   Katha Hamming, MD  warfarin (COUMADIN) 2 MG tablet Take 1 tablet (2 mg total) by mouth daily at 6 PM. 03/24/15   Katha Hamming, MD    Allergies as of 03/25/2015  . (No Known Allergies)    Family History  Problem Relation Age of Onset  . Diabetes Mellitus II Mother   . Lung cancer Brother     Social History   Social History  . Marital Status: Married    Spouse Name: [name]  . Number of Children: [name]  . Years of Education: [name]   Occupational History  . Not on file.   Social History Main Topics  . Smoking  status: Former Smoker    Types: Cigarettes    Quit date: 03/26/1979  . Smokeless tobacco: Never Used  . Alcohol Use: No  . Drug Use: No  . Sexual Activity: Not on file   Other Topics Concern  . Not on file   Social History Narrative    Review of Systems: See HPI, otherwise negative ROS  Physical Exam: BP 103/62 mmHg  Pulse 58  Temp(Src) 97.2 F (36.2 C) (Tympanic)  Resp 20  Ht 5\' 8"  (1.727 m)  Wt 99.791 kg (220 lb)  BMI 33.46 kg/m2  SpO2 100% General:   Alert,  pleasant and cooperative in NAD Head:  Normocephalic and atraumatic. Neck:  Supple; no masses or thyromegaly. Lungs:  Clear throughout to auscultation.    Heart:  Regular rate and rhythm. Abdomen:  Soft, nontender and  nondistended. Normal bowel sounds, without guarding, and without rebound.   Neurologic:  Alert and  oriented x4;  grossly normal neurologically.  Impression/Plan: Blake White is here for an colonoscopy to be performed for stool transplant for treatment of recurrent C. Diff colitis  Risks, benefits, limitations, and alternatives regarding  colonoscopy have been reviewed with the patient.  Questions have been answered.  All parties agreeable.   Lynnae PrudeELLIOTT, Johanan Skorupski, MD  03/29/2015, 11:32 AM

## 2015-03-29 NOTE — Anesthesia Preprocedure Evaluation (Addendum)
Anesthesia Evaluation  Patient identified by MRN, date of birth, ID band Patient awake    Reviewed: Allergy & Precautions, NPO status , Patient's Chart, lab work & pertinent test results  Airway Mallampati: III       Dental  (+) Upper Dentures, Lower Dentures   Pulmonary former smoker,    Pulmonary exam normal breath sounds clear to auscultation       Cardiovascular + CAD, + Past MI and +CHF  Normal cardiovascular exam+ dysrhythmias Atrial Fibrillation      Neuro/Psych CVA negative psych ROS   GI/Hepatic (+) Cirrhosis       , C Dif   Endo/Other  Hypothyroidism   Renal/GU Renal disease     Musculoskeletal negative musculoskeletal ROS (+)   Abdominal Normal abdominal exam  (+)   Peds  Hematology negative hematology ROS (+)   Anesthesia Other Findings   Reproductive/Obstetrics                            Anesthesia Physical Anesthesia Plan  ASA: IV  Anesthesia Plan: General   Post-op Pain Management:    Induction: Intravenous  Airway Management Planned: Nasal Cannula  Additional Equipment:   Intra-op Plan:   Post-operative Plan:   Informed Consent: I have reviewed the patients History and Physical, chart, labs and discussed the procedure including the risks, benefits and alternatives for the proposed anesthesia with the patient or authorized representative who has indicated his/her understanding and acceptance.   Dental advisory given  Plan Discussed with: CRNA and Surgeon  Anesthesia Plan Comments:         Anesthesia Quick Evaluation

## 2015-03-29 NOTE — Transfer of Care (Signed)
Immediate Anesthesia Transfer of Care Note  Patient: Blake White  Procedure(s) Performed: Procedure(s): FECAL TRANSPLANT ([name])  Patient Location: PACU and Endoscopy Unit  Anesthesia Type:General  Level of Consciousness: awake, alert  and oriented  Airway & Oxygen Therapy: Patient Spontanous Breathing and Patient connected to nasal cannula oxygen  Post-op Assessment: Report given to RN and Post -op Vital signs reviewed and stable  Post vital signs: Reviewed and stable  Last Vitals:  Filed Vitals:   03/29/15 1054  BP: 103/62  Pulse: 58  Temp: 36.2 C  Resp: 20    Complications: No apparent anesthesia complications

## 2015-03-30 ENCOUNTER — Encounter: Payer: Self-pay | Admitting: Unknown Physician Specialty

## 2015-04-02 NOTE — Anesthesia Postprocedure Evaluation (Signed)
Anesthesia Post Note  Patient: Blake White  Procedure(s) Performed: Procedure(s) (LRB): FECAL TRANSPLANT ([name])  Patient location during evaluation: Endoscopy Anesthesia Type: General Level of consciousness: awake, awake and alert and oriented Pain management: pain level controlled Vital Signs Assessment: post-procedure vital signs reviewed and stable Respiratory status: spontaneous breathing Cardiovascular status: blood pressure returned to baseline Anesthetic complications: no    Last Vitals:  Filed Vitals:   03/29/15 1250 03/29/15 1300  BP: 86/51 91/50  Pulse: 58 55  Temp:    Resp: 15 24    Last Pain:  Filed Vitals:   03/30/15 0740  PainSc: 0-No pain                 Janayla Marik

## 2015-04-06 ENCOUNTER — Encounter: Payer: Self-pay | Admitting: Emergency Medicine

## 2015-04-06 ENCOUNTER — Inpatient Hospital Stay
Admission: EM | Admit: 2015-04-06 | Discharge: 2015-04-13 | DRG: 441 | Disposition: A | Discharge status: Home / Self Care | Payer: Medicare Other | Attending: Internal Medicine | Admitting: Internal Medicine

## 2015-04-06 ENCOUNTER — Emergency Department: Payer: Medicare Other

## 2015-04-06 DIAGNOSIS — K210 Gastro-esophageal reflux disease with esophagitis: Secondary | ICD-10-CM | POA: Diagnosis present

## 2015-04-06 DIAGNOSIS — Z1630 Resistance to unspecified antimicrobial drugs: Secondary | ICD-10-CM | POA: Diagnosis present

## 2015-04-06 DIAGNOSIS — R531 Weakness: Secondary | ICD-10-CM | POA: Diagnosis not present

## 2015-04-06 DIAGNOSIS — Z833 Family history of diabetes mellitus: Secondary | ICD-10-CM

## 2015-04-06 DIAGNOSIS — K7460 Unspecified cirrhosis of liver: Secondary | ICD-10-CM | POA: Diagnosis present

## 2015-04-06 DIAGNOSIS — Z79899 Other long term (current) drug therapy: Secondary | ICD-10-CM | POA: Diagnosis not present

## 2015-04-06 DIAGNOSIS — I252 Old myocardial infarction: Secondary | ICD-10-CM

## 2015-04-06 DIAGNOSIS — Z7982 Long term (current) use of aspirin: Secondary | ICD-10-CM

## 2015-04-06 DIAGNOSIS — Z8673 Personal history of transient ischemic attack (TIA), and cerebral infarction without residual deficits: Secondary | ICD-10-CM | POA: Diagnosis not present

## 2015-04-06 DIAGNOSIS — K648 Other hemorrhoids: Secondary | ICD-10-CM | POA: Diagnosis present

## 2015-04-06 DIAGNOSIS — R188 Other ascites: Secondary | ICD-10-CM | POA: Diagnosis present

## 2015-04-06 DIAGNOSIS — Z801 Family history of malignant neoplasm of trachea, bronchus and lung: Secondary | ICD-10-CM | POA: Diagnosis not present

## 2015-04-06 DIAGNOSIS — E7220 Disorder of urea cycle metabolism, unspecified: Secondary | ICD-10-CM | POA: Diagnosis present

## 2015-04-06 DIAGNOSIS — N400 Enlarged prostate without lower urinary tract symptoms: Secondary | ICD-10-CM | POA: Diagnosis present

## 2015-04-06 DIAGNOSIS — I482 Chronic atrial fibrillation: Secondary | ICD-10-CM | POA: Diagnosis present

## 2015-04-06 DIAGNOSIS — E7800 Pure hypercholesterolemia, unspecified: Secondary | ICD-10-CM | POA: Diagnosis present

## 2015-04-06 DIAGNOSIS — E785 Hyperlipidemia, unspecified: Secondary | ICD-10-CM | POA: Diagnosis present

## 2015-04-06 DIAGNOSIS — Z6827 Body mass index (BMI) 27.0-27.9, adult: Secondary | ICD-10-CM

## 2015-04-06 DIAGNOSIS — N186 End stage renal disease: Secondary | ICD-10-CM | POA: Diagnosis present

## 2015-04-06 DIAGNOSIS — B961 Klebsiella pneumoniae [K. pneumoniae] as the cause of diseases classified elsewhere: Secondary | ICD-10-CM | POA: Diagnosis present

## 2015-04-06 DIAGNOSIS — Z66 Do not resuscitate: Secondary | ICD-10-CM | POA: Diagnosis present

## 2015-04-06 DIAGNOSIS — N390 Urinary tract infection, site not specified: Secondary | ICD-10-CM | POA: Diagnosis present

## 2015-04-06 DIAGNOSIS — K7682 Hepatic encephalopathy: Secondary | ICD-10-CM | POA: Diagnosis present

## 2015-04-06 DIAGNOSIS — E669 Obesity, unspecified: Secondary | ICD-10-CM | POA: Diagnosis present

## 2015-04-06 DIAGNOSIS — Z7901 Long term (current) use of anticoagulants: Secondary | ICD-10-CM | POA: Diagnosis not present

## 2015-04-06 DIAGNOSIS — M109 Gout, unspecified: Secondary | ICD-10-CM | POA: Diagnosis present

## 2015-04-06 DIAGNOSIS — Z8619 Personal history of other infectious and parasitic diseases: Secondary | ICD-10-CM | POA: Diagnosis not present

## 2015-04-06 DIAGNOSIS — Z87891 Personal history of nicotine dependence: Secondary | ICD-10-CM

## 2015-04-06 DIAGNOSIS — I5023 Acute on chronic systolic (congestive) heart failure: Secondary | ICD-10-CM | POA: Diagnosis present

## 2015-04-06 DIAGNOSIS — K7290 Hepatic failure, unspecified without coma: Principal | ICD-10-CM | POA: Diagnosis present

## 2015-04-06 DIAGNOSIS — E039 Hypothyroidism, unspecified: Secondary | ICD-10-CM | POA: Diagnosis present

## 2015-04-06 DIAGNOSIS — L03119 Cellulitis of unspecified part of limb: Secondary | ICD-10-CM

## 2015-04-06 DIAGNOSIS — Z515 Encounter for palliative care: Secondary | ICD-10-CM | POA: Diagnosis not present

## 2015-04-06 DIAGNOSIS — Z951 Presence of aortocoronary bypass graft: Secondary | ICD-10-CM | POA: Diagnosis not present

## 2015-04-06 DIAGNOSIS — I878 Other specified disorders of veins: Secondary | ICD-10-CM | POA: Diagnosis present

## 2015-04-06 DIAGNOSIS — R140 Abdominal distension (gaseous): Secondary | ICD-10-CM

## 2015-04-06 DIAGNOSIS — N189 Chronic kidney disease, unspecified: Secondary | ICD-10-CM

## 2015-04-06 DIAGNOSIS — I2510 Atherosclerotic heart disease of native coronary artery without angina pectoris: Secondary | ICD-10-CM | POA: Diagnosis present

## 2015-04-06 DIAGNOSIS — K319 Disease of stomach and duodenum, unspecified: Secondary | ICD-10-CM | POA: Diagnosis present

## 2015-04-06 DIAGNOSIS — K567 Ileus, unspecified: Secondary | ICD-10-CM | POA: Diagnosis present

## 2015-04-06 LAB — COMPREHENSIVE METABOLIC PANEL
ALK PHOS: 100 U/L (ref 38–126)
ALT: 58 U/L (ref 17–63)
ANION GAP: 6 (ref 5–15)
AST: 75 U/L — ABNORMAL HIGH (ref 15–41)
Albumin: 2.5 g/dL — ABNORMAL LOW (ref 3.5–5.0)
BUN: 51 mg/dL — ABNORMAL HIGH (ref 6–20)
CALCIUM: 9.2 mg/dL (ref 8.9–10.3)
CO2: 24 mmol/L (ref 22–32)
CREATININE: 2.37 mg/dL — AB (ref 0.61–1.24)
Chloride: 105 mmol/L (ref 101–111)
GFR, EST AFRICAN AMERICAN: 28 mL/min — AB (ref >60)
GFR, EST NON AFRICAN AMERICAN: 24 mL/min — AB (ref >60)
Glucose, Bld: 110 mg/dL — ABNORMAL HIGH (ref 65–99)
Potassium: 4.6 mmol/L (ref 3.5–5.1)
Sodium: 135 mmol/L (ref 135–145)
TOTAL PROTEIN: 5.6 g/dL — AB (ref 6.5–8.1)
Total Bilirubin: 1.5 mg/dL — ABNORMAL HIGH (ref 0.3–1.2)

## 2015-04-06 LAB — LIPASE, BLOOD: LIPASE: 23 U/L (ref 11–51)

## 2015-04-06 LAB — TROPONIN I: Troponin I: <0.03 ng/mL (ref <0.031)

## 2015-04-06 LAB — URINALYSIS COMPLETE WITH MICROSCOPIC (ARMC ONLY)
Bilirubin Urine: NEGATIVE
GLUCOSE, UA: NEGATIVE mg/dL
KETONES UR: NEGATIVE mg/dL
Nitrite: NEGATIVE
Protein, ur: 30 mg/dL — AB
SPECIFIC GRAVITY, URINE: 1.015 (ref 1.005–1.030)
SQUAMOUS EPITHELIAL / LPF: NONE SEEN
pH: 5 (ref 5.0–8.0)

## 2015-04-06 LAB — C DIFFICILE QUICK SCREEN W PCR REFLEX
C DIFFICILE (CDIFF) INTERP: NEGATIVE
C DIFFICILE (CDIFF) TOXIN: NEGATIVE
C Diff antigen: NEGATIVE

## 2015-04-06 LAB — CBC WITH DIFFERENTIAL/PLATELET
Basophils Absolute: 0 10*3/uL (ref 0–0.1)
Basophils Relative: 1 %
Eosinophils Absolute: 0.1 10*3/uL (ref 0–0.7)
Eosinophils Relative: 1 %
HEMATOCRIT: 35.7 % — AB (ref 40.0–52.0)
HEMOGLOBIN: 11.7 g/dL — AB (ref 13.0–18.0)
LYMPHS PCT: 7 %
Lymphs Abs: 0.6 10*3/uL — ABNORMAL LOW (ref 1.0–3.6)
MCH: 33.2 pg (ref 26.0–34.0)
MCHC: 32.6 g/dL (ref 32.0–36.0)
MCV: 101.8 fL — AB (ref 80.0–100.0)
MONO ABS: 1.3 10*3/uL — AB (ref 0.2–1.0)
MONOS PCT: 15 %
NEUTROS ABS: 6.9 10*3/uL — AB (ref 1.4–6.5)
NEUTROS PCT: 76 %
Platelets: 149 10*3/uL — ABNORMAL LOW (ref 150–440)
RBC: 3.51 MIL/uL — ABNORMAL LOW (ref 4.40–5.90)
RDW: 16.6 % — AB (ref 11.5–14.5)
WBC: 9 10*3/uL (ref 3.8–10.6)

## 2015-04-06 LAB — MRSA PCR SCREENING: MRSA BY PCR: NEGATIVE

## 2015-04-06 LAB — AMMONIA: Ammonia: 53 umol/L — ABNORMAL HIGH (ref 9–35)

## 2015-04-06 LAB — ACETAMINOPHEN LEVEL: Acetaminophen (Tylenol), Serum: <10 ug/mL — ABNORMAL LOW (ref 10–30)

## 2015-04-06 LAB — ETHANOL

## 2015-04-06 LAB — SALICYLATE LEVEL: Salicylate Lvl: <4 mg/dL (ref 2.8–30.0)

## 2015-04-06 MED ORDER — ALBUTEROL SULFATE (2.5 MG/3ML) 0.083% IN NEBU: 2.5000 mg | INHALATION_SOLUTION | RESPIRATORY_TRACT | Status: DC | PRN

## 2015-04-06 MED ORDER — ACETAMINOPHEN 325 MG PO TABS
650.0000 mg | ORAL_TABLET | Freq: Four times a day (QID) | ORAL | Status: DC | PRN
Start: 2015-04-06 — End: 2015-04-13

## 2015-04-06 MED ORDER — OXYCODONE HCL 5 MG PO TABS: 5.0000 mg | ORAL_TABLET | ORAL | Status: DC | PRN

## 2015-04-06 MED ORDER — AMIODARONE HCL 200 MG PO TABS
200.0000 mg | ORAL_TABLET | Freq: Every day | ORAL | Status: DC
  Administered 2015-04-06 – 2015-04-13 (×8): 200 mg via ORAL
  Filled 2015-04-06 (×8): qty 1

## 2015-04-06 MED ORDER — LACTULOSE 10 GM/15ML PO SOLN
30.0000 g | Freq: Four times a day (QID) | ORAL | Status: DC
  Administered 2015-04-06: 30 g via ORAL
  Administered 2015-04-07: 18:00:00 15 g via ORAL
  Administered 2015-04-07 (×3): 30 g via ORAL
  Filled 2015-04-06 (×5): qty 60

## 2015-04-06 MED ORDER — ACETAMINOPHEN 650 MG RE SUPP: 650.0000 mg | Freq: Four times a day (QID) | RECTAL | Status: DC | PRN

## 2015-04-06 MED ORDER — FAMOTIDINE 20 MG PO TABS
20.0000 mg | ORAL_TABLET | Freq: Every day | ORAL | Status: DC
  Administered 2015-04-06 – 2015-04-13 (×8): 20 mg via ORAL
  Filled 2015-04-06 (×8): qty 1

## 2015-04-06 MED ORDER — ASPIRIN EC 81 MG PO TBEC
81.0000 mg | DELAYED_RELEASE_TABLET | Freq: Every day | ORAL | Status: DC
  Administered 2015-04-06 – 2015-04-13 (×8): 81 mg via ORAL
  Filled 2015-04-06 (×8): qty 1

## 2015-04-06 MED ORDER — SACCHAROMYCES BOULARDII 250 MG PO CAPS
250.0000 mg | ORAL_CAPSULE | Freq: Two times a day (BID) | ORAL | Status: DC
  Administered 2015-04-06 – 2015-04-13 (×13): 250 mg via ORAL
  Filled 2015-04-06 (×16): qty 1

## 2015-04-06 MED ORDER — TRAMADOL HCL 50 MG PO TABS: 50.0000 mg | ORAL_TABLET | Freq: Two times a day (BID) | ORAL | Status: DC | PRN

## 2015-04-06 MED ORDER — ALLOPURINOL 100 MG PO TABS
150.0000 mg | ORAL_TABLET | Freq: Every day | ORAL | Status: DC
  Administered 2015-04-07 – 2015-04-13 (×7): 150 mg via ORAL
  Filled 2015-04-06 (×7): qty 2

## 2015-04-06 MED ORDER — MIDODRINE HCL 5 MG PO TABS
5.0000 mg | ORAL_TABLET | Freq: Three times a day (TID) | ORAL | Status: DC
  Administered 2015-04-07 – 2015-04-13 (×20): 5 mg via ORAL
  Filled 2015-04-06 (×22): qty 1

## 2015-04-06 MED ORDER — WARFARIN SODIUM 2 MG PO TABS: 2.0000 mg | ORAL_TABLET | Freq: Every day | ORAL | Status: DC

## 2015-04-06 MED ORDER — VANCOMYCIN HCL 10 G IV SOLR
1500.0000 mg | Freq: Once | INTRAVENOUS | Status: DC
  Filled 2015-04-06: qty 1500

## 2015-04-06 MED ORDER — LEVOTHYROXINE SODIUM 75 MCG PO TABS
150.0000 ug | ORAL_TABLET | Freq: Every day | ORAL | Status: DC
  Administered 2015-04-07 – 2015-04-13 (×7): 150 ug via ORAL
  Filled 2015-04-06 (×7): qty 2

## 2015-04-06 MED ORDER — SODIUM CHLORIDE 0.9 % IV BOLUS (SEPSIS)
500.0000 mL | Freq: Once | INTRAVENOUS | Status: AC
  Administered 2015-04-06: 500 mL via INTRAVENOUS

## 2015-04-06 MED ORDER — ONDANSETRON HCL 4 MG/2ML IJ SOLN: 4.0000 mg | Freq: Four times a day (QID) | INTRAMUSCULAR | Status: DC | PRN

## 2015-04-06 MED ORDER — ONDANSETRON HCL 4 MG PO TABS: 4.0000 mg | ORAL_TABLET | Freq: Four times a day (QID) | ORAL | Status: DC | PRN

## 2015-04-06 NOTE — ED Notes (Signed)
Report to floor attempted was asked to call back

## 2015-04-06 NOTE — Progress Notes (Signed)
Contacted to speak with patient's family for concerns about care during previous hospitalization. Spoke with spouse, Blake White, son Blake White and sister Blake White. Talked with them for about 20 minutes. Stated "hospital doctors didn't do anything, it was the GI doctor, Dr. Mechele CollinElliott that figured things out." "When he couldn't move, no one was turning him". "we asked he be placed on cpap and it was several days and it didn't get started, then we brought his home cpap and they left it laying in the corner." They asked about it and were told at 0030am it would be put on after the patient had a bath. Felt like no one addressed their concerns, and they had spoken to several people including the director of the unit the was a patient on. Son states "we don't want him anywhere on second floor". Wanted reassurance care would be better. Attempted to reassure them. Requested they contact office of patient experience in [place], gave them the number. Requested they contact [facility] if there were concerns, requested Bryson HaKristi Roberts, RN, AD to speak with them. Updated them on patients condition, he had just gotten back from [place]. Explained it would be about 15 minutes before they could go back. They seemed satisfied with immediate situation, offered to buy me something for lunch. Thanked them and declined.

## 2015-04-06 NOTE — ED Provider Notes (Addendum)
[facility] Emergency Department Provider Note  ____________________________________________  Time seen: 10:10 AM  I have reviewed the triage vital signs and the nursing notes.   HISTORY  Chief Complaint Weakness    HPI Blake White is a 81 y.o. [place]. He is having difficulty feeding himself to a due to weakness in the arms. Nonfocal, they don't think he has a stroke, but they think that he normally gets like this when he gets dehydrated. He has a history of C. difficile status post fecal transplant one week ago. Patient denies any acute symptoms or focal symptoms. Diarrhea at present time, no chest pain or shortness of breath.     Past Medical History  Diagnosis Date  . CHF (congestive heart failure) (HCC)   . BPH (benign prostatic hyperplasia)   . A-fib (HCC)   . Thyroid disease   . High cholesterol   . Bilateral lower extremity edema   . CAD (coronary artery disease)   . Cirrhosis (HCC)   . CVA (cerebral vascular accident) (HCC)   . Gout   . Hypothyroidism   . Myocardial infarction (HCC)   . Obesity   . Dysrhythmia   . History of chickenpox   . ED (erectile dysfunction)      Patient Active Problem List   Diagnosis Date Noted  . Diarrhea of presumed infectious origin   . C. difficile colitis   . Recurrent colitis due to Clostridium difficile 03/13/2015  . Acute kidney injury (HCC) 01/16/2015  . Abdominal pain 01/10/2015  . Diarrhea 01/10/2015     Past Surgical History  Procedure Laterality Date  . Vein bypass surgery    . Vascular surgery    . Cardiac surgery      quad bypass  . Esophagogastroduodenoscopy (egd) with propofol [name] 03/04/2015    Procedure: ESOPHAGOGASTRODUODENOSCOPY (EGD) WITH PROPOFOL;  Surgeon: Elnita Maxwell, MD;  Location: [facility] ENDOSCOPY;  Service: Endoscopy;  Laterality: [name];  . Coronary artery bypass graft    . Fecal transplant [name]  03/29/2015    Procedure: FECAL TRANSPLANT;  Surgeon: Scot Jun, MD;  Location: [facility] ENDOSCOPY;  Service: Endoscopy;  Laterality: [name];     Current Outpatient Rx  Name  Route  Sig  Dispense  Refill  . allopurinol (ZYLOPRIM) 300 MG tablet   Oral   Take 150 mg by mouth daily.      11   . amiodarone (PACERONE) 200 MG tablet   Oral   Take 200 mg by mouth daily.      1   . aspirin EC 81 MG tablet   Oral   Take 81 mg by mouth daily.         . Cholecalciferol (VITAMIN D3) 1000 UNITS CAPS   Oral   Take 1,000 Units by mouth daily.         . famotidine (PEPCID) 20 MG tablet   Oral   Take 1 tablet (20 mg total) by mouth daily.   30 tablet   0   . furosemide (LASIX) 40 MG tablet   Oral   Take 40 mg by mouth daily.         Marland Kitchen levothyroxine (SYNTHROID, LEVOTHROID) 150 MCG tablet   Oral   Take 150 mcg by mouth daily before breakfast.      1   . midodrine (PROAMATINE) 5 MG tablet   Oral   Take 1 tablet (5 mg  total) by mouth 3 (three) times daily with meals.   90 tablet   0   . ondansetron (ZOFRAN) 4 MG tablet   Oral   Take 4 mg by mouth daily as needed for nausea.         Marland Kitchen saccharomyces boulardii (FLORASTOR) 250 MG capsule   Oral   Take 1 capsule (250 mg total) by mouth 2 (two) times daily.   28 capsule   0   . simvastatin (ZOCOR) 40 MG tablet   Oral   Take 40 mg by mouth at bedtime.      1   . traMADol (ULTRAM) 50 MG tablet   Oral   Take 50 mg by mouth 3 (three) times daily.         Marland Kitchen warfarin (COUMADIN) 2 MG tablet   Oral   Take 1 tablet (2 mg total) by mouth daily at 6 PM.   20 tablet   0   . vancomycin (VANCOCIN) 50 mg/mL oral solution   Oral   Take 10 mLs (500 mg total) by mouth every 6 (six) hours.   300 mL   0      Allergies Review of patient's allergies indicates no known allergies.   Family History  Problem Relation Age of Onset  . Diabetes Mellitus II Mother   . Lung cancer Brother     Social History Social  History  Substance Use Topics  . Smoking status: Former Smoker    Types: Cigarettes    Quit date: 03/26/1979  . Smokeless tobacco: Never Used  . Alcohol Use: No    Review of Systems  Constitutional:   No fever or chills. No weight changes Eyes:   No blurry vision or double vision.  ENT:   No sore throat. Cardiovascular:   No chest pain. Respiratory:   No dyspnea or cough. Gastrointestinal:   Negative for abdominal pain, vomiting and diarrhea.  No BRBPR or melena. Genitourinary:   Negative for dysuria, urinary retention, bloody urine, or difficulty urinating. Musculoskeletal:   Negative for back pain. No joint swelling or pain. Skin:   Negative for rash. Neurological:   Negative for headaches, focal weakness or numbness. Psychiatric:  No anxiety or depression.   Endocrine:  No hot/cold intolerance, changes in energy, or sleep difficulty.  10-point ROS otherwise negative.  ____________________________________________   PHYSICAL EXAM:  VITAL SIGNS: ED Triage Vitals  Enc Vitals Group     BP 04/06/15 1058 120/65 mmHg     Pulse Rate 04/06/15 1058 60     Resp 04/06/15 1145 13     Temp 04/06/15 1058 98.7 F (37.1 C)     Temp Source 04/06/15 1058 Oral     SpO2 04/06/15 1058 98 %     Weight 04/06/15 1058 232 lb (105.235 kg)     Height 04/06/15 1058  (1.727 m)     Head Cir --      Peak Flow --      Pain Score 04/06/15 1100 0     Pain Loc --      Pain Edu? --      Excl. in GC? --      Constitutional:   Alert and oriented. Well appearing and in no distress. Low energy Eyes:   No scleral icterus. No conjunctival pallor. PERRL. EOMI ENT   Head:   Normocephalic and atraumatic.   Nose:   No congestion/rhinnorhea. No septal hematoma   Mouth/Throat:   Dry mucous membranes, no  pharyngeal erythema. No peritonsillar mass. No uvula shift.   Neck:   No stridor. No SubQ emphysema. No meningismus. Hematological/Lymphatic/Immunilogical:   No cervical  lymphadenopathy. Cardiovascular:   RRR. Normal and symmetric distal pulses are present in all extremities. No murmurs, rubs, or gallops. Respiratory:   Normal respiratory effort without tachypnea nor retractions. Breath sounds are clear and equal bilaterally. No wheezes/rales/rhonchi. Gastrointestinal:   Soft and nontender. No distention. There is no CVA tenderness.  No rebound, rigidity, or guarding. Genitourinary:   deferred Musculoskeletal:   2+ pitting edema bilateral lower extremities, symmetric. Beefy erythema with skin changes of venous stasis disease bilaterally. Several small wounds. Slightly warm to touch in the area. Family note that this is roughly baseline for his venous stasis disease.  Neurologic:   Normal speech and language.  CN 2-10 normal. Motor grossly intact. No pronator drift.  Normal gait. No gross focal neurologic deficits are appreciated.  Skin:    Skin is warm, dry and intact. No rash noted.  No petechiae, purpura, or bullae. Psychiatric:   Mood and affect are normal. Speech and behavior are normal. Patient exhibits appropriate insight and judgment.  ____________________________________________    LABS (pertinent positives/negatives) (all labs ordered are listed, but only abnormal results are displayed) Labs Reviewed  URINALYSIS COMPLETEWITH MICROSCOPIC ([facility] ONLY) - Abnormal; Notable for the following:    Color, Urine YELLOW (*)    APPearance HAZY (*)    Hgb urine dipstick 3+ (*)    Protein, ur 30 (*)    Leukocytes, UA 1+ (*)    Bacteria, UA RARE (*)    All other components within normal limits  COMPREHENSIVE METABOLIC PANEL - Abnormal; Notable for the following:    Glucose, Bld 110 (*)    BUN 51 (*)    Creatinine, Ser 2.37 (*)    Total Protein 5.6 (*)    Albumin 2.5 (*)    AST 75 (*)    Total Bilirubin 1.5 (*)    GFR calc non Af Amer 24 (*)    GFR calc Af Amer 28 (*)    All other components within normal limits  ACETAMINOPHEN LEVEL - Abnormal;  Notable for the following:    Acetaminophen (Tylenol), Serum <10 (*)    All other components within normal limits  CBC WITH DIFFERENTIAL/PLATELET - Abnormal; Notable for the following:    RBC 3.51 (*)    Hemoglobin 11.7 (*)    HCT 35.7 (*)    MCV 101.8 (*)    RDW 16.6 (*)    Platelets 149 (*)    Neutro Abs 6.9 (*)    Lymphs Abs 0.6 (*)    Monocytes Absolute 1.3 (*)    All other components within normal limits  AMMONIA - Abnormal; Notable for the following:    Ammonia 53 (*)    All other components within normal limits  URINE CULTURE  C DIFFICILE QUICK SCREEN W PCR REFLEX  ETHANOL  LIPASE, BLOOD  TROPONIN I  SALICYLATE LEVEL   ____________________________________________   EKG    ____________________________________________    RADIOLOGY  CT head unremarkable Chest x-ray shows persistent consolidation at the left base similar to prior x-rays 3 weeks ago.  ____________________________________________   PROCEDURES   ____________________________________________   INITIAL IMPRESSION / ASSESSMENT AND PLAN / ED COURSE  Pertinent labs & imaging results that were available during my care of the patient were reviewed by me and considered in my medical decision making (see chart for details).  Patient presents with  generalized weakness. Labs CT x-ray all unremarkable. X-ray shows a finding of questionable infiltrate in the left base, but this is stable for 3 weeks and the patient does not have productive cough fever or shortness of breath so this is unlikely to be infectious in origin. The patient has stable CK D, no clear infection on his urinalysis, labs otherwise unremarkable. We'll give IV fluids as a trial. I discussed the possibility of bilateral lower external cellulitis with the family, they're very reluctant to give him more antibiotics given his recent trials with C. difficile requiring a fecal transplant. I discussed the case with the GI doctor, Dr. Mechele Collin at  the family's request, who suggests using an antibiotic with minimal anaerobic coverage such as erythromycin if antibiotics are necessary.  ----------------------------------------- 3:00 PM on 04/06/2015 -----------------------------------------  Vital signs stable and normal. Workup negative. Discussed with the family and patient at length. No improvement after a IV fluid bolus challenge. Family agrees with IV antibiotics given the lack of improvement, for cellulitis. However, the family refuses admission at this facility even though we have appropriate resources and hospitalist services due to dissatisfaction with the previous admission at this facility. They request transfer to [facility] for admission there. I'll give another fluid bolus and start vancomycin. The family reports he is still having loose stools, so we will send a C. difficile test if we can obtain a stool sample. He has not had a bowel movement here in the ED. ----------------------------------------- 4:21 PM on 04/06/2015 -----------------------------------------  Case discussed with the hospitalist at [facility]. He notes that appropriate care and be provided at her current facility and this does not accept to transfer. As there is no higher level of care or specialty that we need that is not available here at this hospital., [facility] is not required to accept. I discussed these issues again at length with the family who insists that something further be done, but refuses admission to this hospital. The patient similarly is unable to express a definitive opinion and make a decision. I highly recommended vancomycin which the family completely refuse to allow.  I made clear to them that I think the patient has cellulitis of the lower extremities, with which they disagree, and that I wanted to admit him or IV vancomycin with his serious morbidities. Care of the patient is now signed out to Dr. Sharma Covert who will follow-up with the  patient's and family's decision. If they choose to be discharged home and will be AGAINST MEDICAL ADVICE. They have been encouraged to follow up with her primary care doctor tomorrow and to return to this hospital immediately if they change their mind or notice any worsening.    ____________________________________________   FINAL CLINICAL IMPRESSION(S) / ED DIAGNOSES  Final diagnoses:  Generalized weakness  Cellulitis of lower extremity, unspecified laterality  CKD (chronic kidney disease), unspecified stage   bilateral lower extremity cellulitis. Venous stasis disease    Sharman Cheek, MD 04/06/15 1501  Sharman Cheek, MD 04/06/15 1530  Sharman Cheek, MD 04/06/15 [phone]

## 2015-04-06 NOTE — Discharge Instructions (Signed)
You were seen in the emergency department today for generalized weakness and twitching movements that are similar to prior issues with dehydration in the past. Your examination today is concerning for skin infection of both lower legs. We recommended admission to the hospital, but you refused admission to this facility. We were unable to get him accepted for transfer to another hospital.  Please follow-up with your primary care doctor tomorrow and continue monitoring your condition and return to the ER immediately if symptoms worsen or if you change your mind and are willing to be treated.  Cellulitis Cellulitis is an infection of the skin and the tissue beneath it. The infected area is usually red and tender. Cellulitis occurs most often in the arms and lower legs.  CAUSES  Cellulitis is caused by bacteria that enter the skin through cracks or cuts in the skin. The most common types of bacteria that cause cellulitis are staphylococci and streptococci. SIGNS AND SYMPTOMS   Redness and warmth.  Swelling.  Tenderness or pain.  Fever. DIAGNOSIS  Your health care provider can usually determine what is wrong based on a physical exam. Blood tests may also be done. TREATMENT  Treatment usually involves taking an antibiotic medicine. HOME CARE INSTRUCTIONS   Take your antibiotic medicine as directed by your health care provider. Finish the antibiotic even if you start to feel better.  Keep the infected arm or leg elevated to reduce swelling.  Apply a warm cloth to the affected area up to 4 times per day to relieve pain.  Take medicines only as directed by your health care provider.  Keep all follow-up visits as directed by your health care provider. SEEK MEDICAL CARE IF:   You notice red streaks coming from the infected area.  Your red area gets larger or turns dark in color.  Your bone or joint underneath the infected area becomes painful after the skin has healed.  Your infection  returns in the same area or another area.  You notice a swollen bump in the infected area.  You develop new symptoms.  You have a fever. SEEK IMMEDIATE MEDICAL CARE IF:   You feel very sleepy.  You develop vomiting or diarrhea.  You have a general ill feeling (malaise) with muscle aches and pains.   This information is not intended to replace advice given to you by your health care provider. Make sure you discuss any questions you have with your health care provider.   Document Released: 02/01/2005 Document Revised: 01/13/2015 Document Reviewed: 07/10/2011 Else[employer] Interactive Patient Education [employer]2016 [employer]

## 2015-04-06 NOTE — H&P (Signed)
[facility] - [place] at [facility]   PATIENT NAME: Blake White    MR#:  [id number]  DATE OF BIRTH:  25-Nov-1933  DATE OF ADMISSION:  04/06/2015  PRIMARY CARE PHYSICIAN: Sula Rumple, MD   REQUESTING/REFERRING PHYSICIAN: Dr. Sharma Covert  CHIEF COMPLAINT:   Chief Complaint  Patient presents with  . Weakness    HISTORY OF PRESENT ILLNESS:  Blake White  is a 81 y.o. 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 Poor appetite. Patient had fecal transplant done by Dr. Mechele Collin in one week back. Seems to have done well after this. But over the last 3 days has been extremely weak and confused. Initially family requested patient be transferred to [facility] as they were not happy with his recent hospital stay. Emergency room physician discussed with [facility] and transfer was refused as patient's care was possible at [facility]. Family is unhappy but have agreed for patient admission here the hospital. Initially patient was thought to have lower extremity cellulitis and vancomycin ordered by the ER physician. Wife at bedside mentions that he has always had stasis changes in his lower extremities and his legs look the same. Patient is afebrile and has normal white blood cell count. Urinalysis has shown some bacteria with 6-30 WBC. Ammonia level is 53. History has been obtained from old records, emergency room staff, family at bedside. Patient is unable to contribute to history.  PAST MEDICAL HISTORY:   Past Medical History  Diagnosis Date  . CHF (congestive heart failure) (HCC)   . BPH (benign prostatic hyperplasia)   . A-fib (HCC)   . Thyroid disease   . High cholesterol   . Bilateral lower extremity edema   . CAD (coronary artery disease)   . Cirrhosis (HCC)   . CVA (cerebral vascular  accident) (HCC)   . Gout   . Hypothyroidism   . Myocardial infarction (HCC)   . Obesity   . Dysrhythmia   . History of chickenpox   . ED (erectile dysfunction)     PAST SURGICAL HISTORY:   Past Surgical History  Procedure Laterality Date  . Vein bypass surgery    . Vascular surgery    . Cardiac surgery      quad bypass  . Esophagogastroduodenoscopy (egd) with propofol [name] 03/04/2015    Procedure: ESOPHAGOGASTRODUODENOSCOPY (EGD) WITH PROPOFOL;  Surgeon: Elnita Maxwell, MD;  Location: [facility] END[facility]COPY;  Service: Endoscopy;  Laterality: [name];  . Coronary artery bypass graft    . Fecal transplant [name] 03/29/2015    Procedure: FECAL TRANSPLANT;  Surgeon: Scot Jun, MD;  Location: [facility] ENDOSCOPY;  Service: Endoscopy;  Laterality: [name];    SOCIAL HISTORY:   Social History  Substance Use Topics  . Smoking status: Former Smoker    Types: Cigarettes    Quit date: 03/26/1979  . Smokeless tobacco: Never Used  . Alcohol Use: No    FAMILY HISTORY:   Family History  Problem Relation Age of Onset  . Diabetes Mellitus II Mother   . Lung cancer Brother     DRUG ALLERGIES:  No Known Allergies  REVIEW OF SYSTEMS:   Review of Systems  Unable to perform ROS: mental acuity    MEDICATIONS AT HOME:   Prior to Admission medications   Medication Sig Start Date End Date Taking? Authorizing Provider  allopurinol (  ZYLOPRIM) 300 MG tablet Take 150 mg by mouth daily. 11/20/14  Yes Historical Provider, MD  amiodarone (PACERONE) 200 MG tablet Take 200 mg by mouth daily. 10/20/14  Yes Historical Provider, MD  aspirin EC 81 MG tablet Take 81 mg by mouth daily. 03/11/14  Yes Historical Provider, MD  Cholecalciferol (VITAMIN D3) 1000 UNITS CAPS Take 1,000 Units by mouth daily. 03/11/14  Yes Historical Provider, MD  famotidine (PEPCID) 20 MG tablet Take 1 tablet (20 mg total) by mouth daily. 03/24/15  Yes Katha Hamming, MD  furosemide (LASIX) 40 MG tablet Take 40 mg by mouth daily.  02/16/15  Yes Historical Provider, MD  levothyroxine (SYNTHROID, LEVOTHROID) 150 MCG tablet Take 150 mcg by mouth daily before breakfast. 11/02/14  Yes Historical Provider, MD  midodrine (PROAMATINE) 5 MG tablet Take 1 tablet (5 mg total) by mouth 3 (three) times daily with meals. 01/20/15  Yes Ramonita Lab, MD  ondansetron (ZOFRAN) 4 MG tablet Take 4 mg by mouth daily as needed for nausea.   Yes Historical Provider, MD  saccharomyces boulardii (FLORASTOR) 250 MG capsule Take 1 capsule (250 mg total) by mouth 2 (two) times daily. 02/19/15  Yes Myrna Blazer, MD  simvastatin (ZOCOR) 40 MG tablet Take 40 mg by mouth at bedtime. 11/26/14  Yes Historical Provider, MD  traMADol (ULTRAM) 50 MG tablet Take 50 mg by mouth 3 (three) times daily.   Yes Historical Provider, MD  warfarin (COUMADIN) 2 MG tablet Take 1 tablet (2 mg total) by mouth daily at 6 PM. 03/24/15  Yes Katha Hamming, MD  vancomycin (VANCOCIN) 50 mg/mL oral solution Take 10 mLs (500 mg total) by mouth every 6 (six) hours. 03/24/15   Katha Hamming, MD      VITAL SIGNS:  Blood pressure 110/71, pulse 90, temperature 98 F (36.7 C), temperature source Oral, resp. rate 14, height  (1.727 m), weight 105.235 kg (232 lb), SpO2 99 %.  PHYSICAL EXAMINATION:  Physical Exam  GENERAL:  81 y.o.-year-old patient lying in the bed with no acute distress. Drowsy EYES: Pupils equal, round, reactive to light and accommodation. No scleral icterus. Extraocular muscles intact.  HEENT: Head atraumatic, normocephalic. Oropharynx and nasopharynx clear. No oropharyngeal erythema, moist oral mucosa  NECK:  Supple, no jugular venous distention. No thyroid enlargement, no tenderness.  LUNGS: Normal breath sounds bilaterally, no wheezing, rales, rhonchi. No use of accessory muscles of respiration.  CARDIOVASCULAR: S1, S2 normal. No murmurs, rubs, or gallops.  ABDOMEN: Soft, nontender, nondistended. Bowel sounds present. No organomegaly or  mass.  EXTREMITIES: No  cyanosis, or clubbing. + 2 pedal & radial pulses b/l.  Bilateral lower extremity edema NEUROLOGIC: Cranial nerves II through XII are intact. No focal Motor or sensory deficits appreciated b/l PSYCHIATRIC: The patient is alert and oriented x 3. Good affect.  SKIN: Bilateral lower extremity stasis changes with skin excoriations. Asterixis positive  LABORATORY PANEL:   CBC  Recent Labs Lab 04/06/15 1151  WBC 9.0  HGB 11.7*  HCT 35.7*  PLT 149*   ------------------------------------------------------------------------------------------------------------------  Chemistries   Recent Labs Lab 04/06/15 1151  NA 135  K 4.6  CL 105  CO2 24  GLUCOSE 110*  BUN 51*  CREATININE 2.37*  CALCIUM 9.2  AST 75*  ALT 58  ALKPHOS 100  BILITOT 1.5*   ------------------------------------------------------------------------------------------------------------------  Cardiac Enzymes  Recent Labs Lab 04/06/15 1151  TROPONINI <0.03   ------------------------------------------------------------------------------------------------------------------  RADIOLOGY:  Ct Head Wo Contrast  04/06/2015  CLINICAL DATA:  Altered mental status  EXAM: CT HEAD WITHOUT CONTRAST TECHNIQUE: Contiguous axial images were obtained from the base of the skull through the vertex without intravenous contrast. COMPARISON:  March 20, 2015 FINDINGS: Mild diffuse atrophy is stable. There is no intracranial mass, hemorrhage, extra-axial fluid collection, or midline shift. There is patchy small vessel disease throughout the centra semiovale bilaterally, stable. There is no new gray-white compartment lesion. No acute infarct evident. Bony calvarium appears intact. The mastoid air cells are clear. Visualized orbits appear symmetric and normal bilaterally. IMPRESSION: Stable atrophy with periventricular small vessel disease. No acute infarct evident. No hemorrhage or mass effect. Electronically Signed    By: Bretta BangWilliam  Woodruff III M.D.   On: 04/06/2015 11:05   Dg Chest Port 1 View  04/06/2015  CLINICAL DATA:  Weakness following colonoscopy 3 days prior. History of atrial fibrillation. EXAM: PORTABLE CHEST 1 VIEW COMPARISON:  March 18, 2015 FINDINGS: There is persistent left base consolidation with small left effusion. Lungs elsewhere clear. Heart is enlarged with pulmonary vascularity within normal limits. There is no appreciable adenopathy. There is atherosclerotic calcification in the aortic arch. Patient is status post coronary artery bypass grafting. IMPRESSION: Persistent consolidation left base with small left effusion. Suspect pneumonia left base. Right lung clear. Stable cardiomegaly. Followup PA and lateral chest radiographs recommended in 3-4 weeks following trial of antibiotic therapy to ensure resolution and exclude underlying malignancy. Electronically Signed   By: Bretta BangWilliam  Woodruff III M.D.   On: 04/06/2015 10:58     IMPRESSION AND PLAN:   * Hepatic encephalopathy Start lactulose and rifaximin. We'll give first dose now. Repeat ammonia level tomorrow. Consult GI.  * Lower extremity stasis changes Past medical history there is any cellulitis at this time. Wife at bedside insists that his legs have looked the same for a long time. Patient is afebrile and has normal white count. Would try to limit any antibiotic use in this patient with recurrent C. Difficile.  * UTI? Wait for cultures. No antibiotics.  I  have discussed with family regarding seeing a response with lactulose in his mental status and weakness. If he is not improving and we might have to start him on antibiotics for lower extremity cellulitis/UTI.  * CKD stage IV is stable  * Atrial fibrillation No tachycardia. Continue medications along with Coumadin.  * DVT prophylaxis On Coumadin   I have discussed in detail regarding patient's prior admission and present admission spinal care with wife and daughter at  bedside. Have reassured regarding the fact that patient can be treated for here at this hospital. They have verbalized understanding and agree with the plan.  All the records are reviewed and case discussed with ED provider.  CODE STATUS: FULL  TOTAL TIME TAKING CARE OF THIS PATIENT: 45 minutes.   Milagros LollSudini, Otha Monical R M.D on 04/06/2015 at 6:36 PM  Between 7am to 6pm - Pager - [phone]  After 6pm go to [email or web address] - password [email or web address]  [facility] [place] [facility]  Office  [phone]  CC: Primary care physician; [name], Charanjit, MD   Note: This dictation was prepared with Dragon dictation along with smaller phrase technology. Any transcriptional errors that result from this process are unintentional.

## 2015-04-06 NOTE — ED Notes (Signed)
Stool sample collected and sent to lab.

## 2015-04-06 NOTE — ED Notes (Signed)
Pt lives at [facility].  EMS was called by family, due to mental status changes and pt complaints of generalized weakness.

## 2015-04-07 LAB — COMPREHENSIVE METABOLIC PANEL
ALK PHOS: 96 U/L (ref 38–126)
ALT: 54 U/L (ref 17–63)
AST: 65 U/L — ABNORMAL HIGH (ref 15–41)
Albumin: 2.4 g/dL — ABNORMAL LOW (ref 3.5–5.0)
Anion gap: 6 (ref 5–15)
BILIRUBIN TOTAL: 1.7 mg/dL — AB (ref 0.3–1.2)
BUN: 47 mg/dL — ABNORMAL HIGH (ref 6–20)
CALCIUM: 9.2 mg/dL (ref 8.9–10.3)
CO2: 23 mmol/L (ref 22–32)
CREATININE: 2.22 mg/dL — AB (ref 0.61–1.24)
Chloride: 108 mmol/L (ref 101–111)
GFR, EST AFRICAN AMERICAN: 31 mL/min — AB (ref >60)
GFR, EST NON AFRICAN AMERICAN: 26 mL/min — AB (ref >60)
Glucose, Bld: 93 mg/dL (ref 65–99)
Potassium: 4.2 mmol/L (ref 3.5–5.1)
Sodium: 137 mmol/L (ref 135–145)
Total Protein: 5.5 g/dL — ABNORMAL LOW (ref 6.5–8.1)

## 2015-04-07 LAB — CBC
HCT: 34.3 % — ABNORMAL LOW (ref 40.0–52.0)
HEMOGLOBIN: 11.4 g/dL — AB (ref 13.0–18.0)
MCH: 33.9 pg (ref 26.0–34.0)
MCHC: 33.1 g/dL (ref 32.0–36.0)
MCV: 102.4 fL — ABNORMAL HIGH (ref 80.0–100.0)
PLATELETS: 130 10*3/uL — AB (ref 150–440)
RBC: 3.35 MIL/uL — AB (ref 4.40–5.90)
RDW: 17 % — ABNORMAL HIGH (ref 11.5–14.5)
WBC: 7.2 10*3/uL (ref 3.8–10.6)

## 2015-04-07 LAB — PROTIME-INR
INR: 3.14
PROTHROMBIN TIME: 31.7 s — AB (ref 11.4–15.0)

## 2015-04-07 LAB — AMMONIA: Ammonia: 35 umol/L (ref 9–35)

## 2015-04-07 LAB — TSH: TSH: 1.76 u[IU]/mL (ref 0.350–4.500)

## 2015-04-07 LAB — BRAIN NATRIURETIC PEPTIDE: B Natriuretic Peptide: 419 pg/mL — ABNORMAL HIGH (ref 0.0–100.0)

## 2015-04-07 MED ORDER — OXYCODONE HCL 5 MG PO TABS: 5.0000 mg | ORAL_TABLET | ORAL | Status: DC | PRN

## 2015-04-07 MED ORDER — LACTATED RINGERS IV SOLN
INTRAVENOUS | Status: DC
  Administered 2015-04-07 – 2015-04-09 (×4): via INTRAVENOUS

## 2015-04-07 MED ORDER — FUROSEMIDE 10 MG/ML IJ SOLN
20.0000 mg | Freq: Two times a day (BID) | INTRAMUSCULAR | Status: DC
  Administered 2015-04-07 – 2015-04-08 (×3): 20 mg via INTRAVENOUS
  Filled 2015-04-07 (×3): qty 2

## 2015-04-07 MED ORDER — CETYLPYRIDINIUM CHLORIDE 0.05 % MT LIQD
7.0000 mL | Freq: Two times a day (BID) | OROMUCOSAL | Status: DC
  Administered 2015-04-07 – 2015-04-13 (×11): 7 mL via OROMUCOSAL

## 2015-04-07 MED ORDER — TRAMADOL HCL 50 MG PO TABS: 50.0000 mg | ORAL_TABLET | Freq: Two times a day (BID) | ORAL | Status: DC | PRN

## 2015-04-07 NOTE — Consult Note (Signed)
GI Inpatient Consult Note  Reason for Consult: Hepatic Encephalopathy   Attending Requesting Consult: Dr. Elpidio Anis  History of Present Illness: Blake White is a 81 y.o. male with a history of cirrhosis, CHF, ESRD IV, recurrent c diff with fecal transplant on 03/04/2015.  His wife and daughter are present in the room and provide the history.  He has been staying at [facility].  They report that he started having periods of nonsensical behavior on Friday or Saturday, that have become worse.  He was brought to the ED at [facility] last night due to the increasing confusion and weakness.  He was last seen in our office on 02/03/2015 following a hospital stay 01/16/2015 for rapid onset lower extremity and abdominal swelling.  He had been recently discharged on 9/6 on a course of Flagyl for c diff.  US abdomen revealed possible cirrhosis.  During that hospitalization he was started on Spironolactone 100 mg and Lasix 40 mg.  Oral vanc was added to Flagyl.  He had an EGD completed on 03/04/2015 with Dr. Shelle Iron for the indication of cirrhosis rule out esophogeal varices.  Findings:  LA Grade C reflux esophagitis. Non- bleeding erosive gastropathy. Normal examined duodenum.  Recommendations; Zantac for esophagitis and erosions since c diff developed shortly after starting omeprazole.  Start probiotics.  Pathology negative for h pylori or malignancy.  Colonoscopy with Dr. Mechele Collin on 03/29/2015 for indication of Fecal transplant for treatment of C diff.  Impression: Internal hemorrhoids, fecal microbiota transplant performed.    He was started on lactulose last night and has had a bowel movement  (which was reported as three BM's) today.  Wife is unsure on the medications he has been receiving while at [facility].   Past Medical History:  Past Medical History  Diagnosis Date  . CHF (congestive heart failure) (HCC)   . BPH (benign prostatic hyperplasia)   . A-fib (HCC)   . Thyroid disease   . High  cholesterol   . Bilateral lower extremity edema   . CAD (coronary artery disease)   . Cirrhosis (HCC)   . CVA (cerebral vascular accident) (HCC)   . Gout   . Hypothyroidism   . Myocardial infarction (HCC)   . Obesity   . Dysrhythmia   . History of chickenpox   . ED (erectile dysfunction)     Problem List: Patient Active Problem List   Diagnosis Date Noted  . Hepatic encephalopathy (HCC) 04/06/2015  . Diarrhea of presumed infectious origin   . C. difficile colitis   . Recurrent colitis due to Clostridium difficile 03/13/2015  . Acute kidney injury (HCC) 01/16/2015  . Abdominal pain 01/10/2015  . Diarrhea 01/10/2015    Past Surgical History: Past Surgical History  Procedure Laterality Date  . Vein bypass surgery    . Vascular surgery    . Cardiac surgery      quad bypass  . Esophagogastroduodenoscopy (egd) with propofol [name] 03/04/2015    Procedure: ESOPHAGOGASTRODUODENOSCOPY (EGD) WITH PROPOFOL;  Surgeon: Elnita Maxwell, MD;  Location: [facility] ENDOSCOPY;  Service: Endoscopy;  Laterality: [name];  . Coronary artery bypass graft    . Fecal transplant [name] 03/29/2015    Procedure: FECAL TRANSPLANT;  Surgeon: Scot Jun, MD;  Location: [facility] ENDOSCOPY;  Service: Endoscopy;  Laterality: [name];    Allergies: No Known Allergies  Home Medications: Prescriptions prior to admission  Medication Sig Dispense Refill Last Dose  . allopurinol (ZYLOPRIM) 300 MG tablet Take 150 mg by mouth daily.  11 04/06/2015 at 0900  . amiodarone (PACERONE) 200 MG tablet Take 200 mg by mouth daily.  1 04/05/2015 at 0900  . aspirin EC 81 MG tablet Take 81 mg by mouth daily.   04/05/2015 at 0900  . Cholecalciferol (VITAMIN D3) 1000 UNITS CAPS Take 1,000 Units by mouth daily.   04/05/2015 at 0900  . famotidine (PEPCID) 20 MG tablet Take 1 tablet (20 mg total) by mouth daily. 30 tablet 0 04/05/2015 at 0900  . furosemide (LASIX) 40 MG tablet Take 40 mg by mouth daily.   04/05/2015 at 0900  . levothyroxine  (SYNTHROID, LEVOTHROID) 150 MCG tablet Take 150 mcg by mouth daily before breakfast.  1 04/06/2015 at 0600  . midodrine (PROAMATINE) 5 MG tablet Take 1 tablet (5 mg total) by mouth 3 (three) times daily with meals. 90 tablet 0 04/05/2015 at 2100  . ondansetron (ZOFRAN) 4 MG tablet Take 4 mg by mouth daily as needed for nausea.   04/04/2015 at prn  . saccharomyces boulardii (FLORASTOR) 250 MG capsule Take 1 capsule (250 mg total) by mouth 2 (two) times daily. 28 capsule 0 04/05/2015 at 1700  . simvastatin (ZOCOR) 40 MG tablet Take 40 mg by mouth at bedtime.  1 04/05/2015 at 2100  . traMADol (ULTRAM) 50 MG tablet Take 50 mg by mouth 3 (three) times daily.   04/05/2015 at 2100  . warfarin (COUMADIN) 2 MG tablet Take 1 tablet (2 mg total) by mouth daily at 6 PM. 20 tablet 0 04/05/2015 at 1800  . vancomycin (VANCOCIN) 50 mg/mL oral solution Take 10 mLs (500 mg total) by mouth every 6 (six) hours. 300 mL 0 03/29/2015 at 1800   Home medication reconciliation was completed with the patient.   Scheduled Inpatient Medications:   . allopurinol  150 mg Oral Daily  . amiodarone  200 mg Oral Daily  . antiseptic oral rinse  7 mL Mouth Rinse q12n4p  . aspirin EC  81 mg Oral Daily  . famotidine  20 mg Oral Daily  . lactulose  30 g Oral Q6H  . levothyroxine  150 mcg Oral QAC breakfast  . midodrine  5 mg Oral TID WC  . saccharomyces boulardii  250 mg Oral BID    Continuous Inpatient Infusions:     PRN Inpatient Medications:  acetaminophen **OR** acetaminophen, albuterol, ondansetron **OR** ondansetron (ZOFRAN) IV, oxyCODONE, traMADol  Family History: family history includes Diabetes Mellitus II in his mother; Lung cancer in his brother.    Social History:   reports that he quit smoking about 36 years ago. His smoking use included Cigarettes. He has never used smokeless tobacco. He reports that he does not drink alcohol or use illicit drugs.   Review of Systems: unable to assess   Physical  Examination: BP 101/55 mmHg  Pulse 68  Temp(Src) 98.3 F (36.8 C) (Oral)  Resp 18  Ht 5\' 9"  (1.753 m)  Wt 85.095 kg (187 lb 9.6 oz)  BMI 27.69 kg/m2  SpO2 100% Gen: NAD, nonsensical, wife and daughter are present and provide history HEENT: PEERLA, EOMI, Neck: supple, no JVD or thyromegaly Chest: unable to elicit deep breaths, CTA bilaterally, no wheezes, crackles, or other adventitious sounds CV: irr, no m/g/c/r Abd: firm, slight tenderness, distended +BS in all four quadrants; no HSM, guarding, ridigity, or rebound tenderness Ext: no edema, well perfused with 2+ pulses, Skin: no rash or lesions noted Lymph: no LAD  Data: Lab Results  Component Value Date   WBC 7.2 04/07/2015  HGB 11.4* 04/07/2015   HCT 34.3* 04/07/2015   MCV 102.4* 04/07/2015   PLT 130* 04/07/2015    Recent Labs Lab 04/06/15 1151 04/07/15 0451  HGB 11.7* 11.4*   Lab Results  Component Value Date   NA 137 04/07/2015   K 4.2 04/07/2015   CL 108 04/07/2015   CO2 23 04/07/2015   BUN 47* 04/07/2015   CREATININE 2.22* 04/07/2015   Lab Results  Component Value Date   ALT 54 04/07/2015   AST 65* 04/07/2015   ALKPHOS 96 04/07/2015   BILITOT 1.7* 04/07/2015    Recent Labs Lab 04/07/15 0451  INR 3.14    Imaging: CLINICAL DATA: Altered mental status  EXAM: CT HEAD WITHOUT CONTRAST  TECHNIQUE: Contiguous axial images were obtained from the base of the skull through the vertex without intravenous contrast.  COMPARISON: March 20, 2015  FINDINGS: Mild diffuse atrophy is stable. There is no intracranial mass, hemorrhage, extra-axial fluid collection, or midline shift. There is patchy small vessel disease throughout the centra semiovale bilaterally, stable. There is no new gray-white compartment lesion. No acute infarct evident. Bony calvarium appears intact. The mastoid air cells are clear. Visualized orbits appear symmetric and normal bilaterally.  IMPRESSION: Stable  atrophy with periventricular small vessel disease. No acute infarct evident. No hemorrhage or mass effect.   Electronically Signed  By: Bretta Bang III M.D.  On: 04/06/2015 11:05  CLINICAL DATA: Weakness following colonoscopy 3 days prior. History of atrial fibrillation.  EXAM: PORTABLE CHEST 1 VIEW  COMPARISON: March 18, 2015  FINDINGS: There is persistent left base consolidation with small left effusion. Lungs elsewhere clear. Heart is enlarged with pulmonary vascularity within normal limits. There is no appreciable adenopathy. There is atherosclerotic calcification in the aortic arch. Patient is status post coronary artery bypass grafting.  IMPRESSION: Persistent consolidation left base with small left effusion. Suspect pneumonia left base. Right lung clear. Stable cardiomegaly. Followup PA and lateral chest radiographs recommended in 3-4 weeks following trial of antibiotic therapy to ensure resolution and exclude underlying malignancy.   Electronically Signed  By: Bretta Bang III M.D.  On: 04/06/2015 10:58  Assessment/Plan: Mr. Prust is a 81 y.o. male with history of CHF, ESRD IV, cirrhosis, recurrent c diff with recent fecal transplant on 03/29/2015. Presented with confusion and weakness.  Ammonia level on admission 53, currently 35; BNP 419; AST 65; ALT 54; Albumin 2.4; bili 1.7; hgb 11.4; platelets 130.  PT/INR 31.7/3.14 (on coumadin)  Being treated with lactulose 30 mg q 6 hrs ; Pepcid; probiotics;  Lasix 20 mg BID    Recommendations: We recommend ultrasound abdomen be completed in the morning.  Agree with current treatment plan. He had a neuro consult on 03/20/2015, but may benefit from another neuro consult as well as psych consult. No other GI recommendations at this time. He should follow up in our office with Harmon Dun, PA-C 1-2 weeks after discharge. We will continue to follow with you. Thank you for the consult.  Please call with questions or concerns.  Carney Harder, PA-C  I personally performed these services.

## 2015-04-07 NOTE — Progress Notes (Signed)
ANTICOAGULATION CONSULT NOTE - Initial Consult  Pharmacy Consult for wafarin Indication: atrial fibrillation  No Known Allergies  Patient Measurements: Height: 5\' 9"  (175.3 cm) Weight: 187 lb 9.6 oz (85.095 kg) IBW/kg (Calculated) : 70.7   Vital Signs: Temp: 98.1 F (36.7 C) (11/30 1042) Temp Source: Oral (11/30 1042) BP: 106/56 mmHg (11/30 1042) Pulse Rate: 72 (11/30 1042)  Labs:  Recent Labs  04/06/15 1151 04/07/15 0451  HGB 11.7* 11.4*  HCT 35.7* 34.3*  PLT 149* 130*  LABPROT  --  31.7*  INR  --  3.14  CREATININE 2.37* 2.22*  TROPONINI <0.03  --     Estimated Creatinine Clearance: 29.2 mL/min (by C-G formula based on Cr of 2.22).   Medical History: Past Medical History  Diagnosis Date  . CHF (congestive heart failure) (HCC)   . BPH (benign prostatic hyperplasia)   . A-fib (HCC)   . Thyroid disease   . High cholesterol   . Bilateral lower extremity edema   . CAD (coronary artery disease)   . Cirrhosis (HCC)   . CVA (cerebral vascular accident) (HCC)   . Gout   . Hypothyroidism   . Myocardial infarction (HCC)   . Obesity   . Dysrhythmia   . History of chickenpox   . ED (erectile dysfunction)     Medications:  Scheduled:  . allopurinol  150 mg Oral Daily  . amiodarone  200 mg Oral Daily  . antiseptic oral rinse  7 mL Mouth Rinse q12n4p  . aspirin EC  81 mg Oral Daily  . famotidine  20 mg Oral Daily  . furosemide  20 mg Intravenous Q12H  . lactulose  30 g Oral Q6H  . levothyroxine  150 mcg Oral QAC breakfast  . midodrine  5 mg Oral TID WC  . saccharomyces boulardii  250 mg Oral BID    Assessment: Pharmacy consulted to dose wafarin in a 81 yo male with atrial fibrillation.  INR of 3.14 today.   PTA wafarin dosing of 2 mg po daily.  Goal of Therapy:  INR 2-3 Monitor platelets by anticoagulation protocol: Yes   Plan:  Will hold today's dose of warfarin and recheck INR in AM.  If INR is 3 or less, would consider restarting lower dose of  warfarin 1.5 mg po daily.   Pharmacy will continue to follow.   Hakop Humbarger G 04/07/2015,1:42 PM

## 2015-04-07 NOTE — Care Management (Signed)
Admitted to [facility]  with the diagnosis of hepatic encephalopathy. Discharged from [facility] 03/24/15 to [facility]. Wife is Tamela OddiBetsy [phone]). Sees Dr. Elmer RampVirk as primary care physician. Followed by [facility] in the past. [place] in the home.  Gwenette GreetBrenda S Karrah Mangini RN MSN CCM Care Management [phone]

## 2015-04-07 NOTE — Consult Note (Signed)
Pt with multiple medical problems well outlined in chart.  He has signif altered mental status similar to what he had in hosptial 2 weeks ago when he was dehydrated with BUN in 80's.  His ammonia level not signif elevated but he does have cirrhosis and abd appears to be more swollen at this time likely with ascites.  May need US of abdomen.  His neck veins are flat at lying 30-45 degrees in bed.  He has gotten lasix and lactulose and had 4 bowel movements today since got lactulose.  I have ordered it held for the rest of the day since he has had 4 bowel movements since lactulose started.  Usually 3 a day is a reasonable goal.  His obtundation is similar to 2 1/2 weeks ago when neurologist saw him and CT of head done, he responded to hydration at that time.  Intravascular vol depletion can make effects of elevated serum NH3 worse.  Will hydrate some tonight and conference with family that he needs long term care and his multiple medical problems are unlikely to make much improvement.

## 2015-04-07 NOTE — Consult Note (Signed)
WOC wound consult note Reason for Consult: Management of chronic lower extremity venous insufficiency, skin tears to upper extremities.  S/P fecal transplant for recurrent C-Diff Wound type:Chronic venous insufficiency  Lower extremities are edematous with scabbed lesions.  Legs are erythematous, and that is baseline for this patient.  Temperature is normal. Healing skin tear to left upper forearm near elbow.  New epithelial growth noted.  Will keep this protected and covered.  Right forearm has a skin tear with skin flap defect noted.   Bilateral arms are edematous and weeping.  Bruising noted to bilateral arms.  Pressure Ulcer POA: [name] Measurement: Left arm 3 cm x 0.2 cm new epithelium and 2 cm x 0.2 cm new epithelium Right arm 3 cm x 2.2 cm x 0.2 cm Left pretibial leg scabbed lesions 0.5 cm in diameter Wound ZOX:WRUEAVWbed:scabbed to left leg Drainage (amount, consistency, odor) serous weeping Periwound:Hemosiderin staining to bilateral lower extremities Dressing procedure/placement/frequency:Cleanse bilateral legs with soap and water and pat dry.  Apply zinc layer followed by self adhering Coban wrap.  Change weekly. Cleanse left and right arm skin tears with soap and water and pat dry.  Apply calcium alginate to wound bed for absorption, due to weeping.  Cover with 4x4 gauze and kerlix/tape.  Change Mon/Wed/Fri. Keep skin clean and dry.  Turn and reposition to offload pressure, every 2 hours.  WOC team will follow.  Maple HudsonKaren Jessica Seidman RN BSN CWON Pager [phone]

## 2015-04-07 NOTE — Consult Note (Signed)
[facility] Cardiology Consultation Note  [name], MRN: [id number], DOB/AGE: 06-09-1933 81 y.o. Admit date: 04/06/2015   Date of Consult: 04/07/2015 Primary Physician: Sula Rumple, MD Primary Cardiologist: None  Chief Complaint:  Chief Complaint  Patient presents with  . Weakness   Reason for Consult: nonvalvular atrial fibrillation with coronary artery disease and Stallings dysfunction heart failure  HPI: 81 y.o. male with known chronic nonvalvular atrial fibrillation with chronic systolic dysfunction congestive heart failure coronary artery disease with previous myocardial infarction having liver cirrhosis and significant lower extremity edema and ascites most consistent with liver damage and low albumin as well as some chronic kidney disease. There is no current evidence of myocardial infarction or significant exacerbation of congestive heart failure causing edema. He has had significant elevation of ammonia levels likely causing some of his concerns of recent mental status changes. The patient has had appropriate medication management including Lasix for his lower extremity edema although this has occasionally caused significant problems including dehydration. Therefore the majority of his issue is continued liver cirrhosis  Past Medical History  Diagnosis Date  . CHF (congestive heart failure) (HCC)   . BPH (benign prostatic hyperplasia)   . A-fib (HCC)   . Thyroid disease   . High cholesterol   . Bilateral lower extremity edema   . CAD (coronary artery disease)   . Cirrhosis (HCC)   . CVA (cerebral vascular accident) (HCC)   . Gout   . Hypothyroidism   . Myocardial infarction (HCC)   . Obesity   . Dysrhythmia   . History of chickenpox   . ED (erectile dysfunction)       Surgical History:  Past Surgical History  Procedure Laterality Date  . Vein bypass surgery    . Vascular surgery    . Cardiac surgery      quad bypass  .  Esophagogastroduodenoscopy (egd) with propofol [name] 03/04/2015    Procedure: ESOPHAGOGASTRODUODENOSCOPY (EGD) WITH PROPOFOL;  Surgeon: Elnita Maxwell, MD;  Location: [facility] ENDOSCOPY;  Service: Endoscopy;  Laterality: [name];  . Coronary artery bypass graft    . Fecal transplant [name] 03/29/2015    Procedure: FECAL TRANSPLANT;  Surgeon: Scot Jun, MD;  Location: [facility] ENDOSCOPY;  Service: Endoscopy;  Laterality: [name];     Home Meds: Prior to Admission medications   Medication Sig Start Date End Date Taking? Authorizing Provider  allopurinol (ZYLOPRIM) 300 MG tablet Take 150 mg by mouth daily. 11/20/14  Yes Historical Provider, MD  amiodarone (PACERONE) 200 MG tablet Take 200 mg by mouth daily. 10/20/14  Yes Historical Provider, MD  aspirin EC 81 MG tablet Take 81 mg by mouth daily. 03/11/14  Yes Historical Provider, MD  Cholecalciferol (VITAMIN D3) 1000 UNITS CAPS Take 1,000 Units by mouth daily. 03/11/14  Yes Historical Provider, MD  famotidine (PEPCID) 20 MG tablet Take 1 tablet (20 mg total) by mouth daily. 03/24/15  Yes Katha Hamming, MD  furosemide (LASIX) 40 MG tablet Take 40 mg by mouth daily. 02/16/15  Yes Historical Provider, MD  levothyroxine (SYNTHROID, LEVOTHROID) 150 MCG tablet Take 150 mcg by mouth daily before breakfast. 11/02/14  Yes Historical Provider, MD  midodrine (PROAMATINE) 5 MG tablet Take 1 tablet (5 mg total) by mouth 3 (three) times daily with meals. 01/20/15  Yes Ramonita Lab, MD  ondansetron (ZOFRAN) 4 MG tablet Take 4 mg by mouth daily as needed for nausea.   Yes Historical Provider, MD  saccharomyces boulardii (FLORASTOR) 250 MG capsule Take  1 capsule (250 mg total) by mouth 2 (two) times daily. 02/19/15  Yes Myrna Blazer, MD  simvastatin (ZOCOR) 40 MG tablet Take 40 mg by mouth at bedtime. 11/26/14  Yes Historical Provider, MD  traMADol (ULTRAM) 50 MG tablet Take 50 mg by mouth 3 (three) times daily.   Yes Historical Provider, MD  warfarin (COUMADIN) 2  MG tablet Take 1 tablet (2 mg total) by mouth daily at 6 PM. 03/24/15  Yes Katha Hamming, MD  vancomycin (VANCOCIN) 50 mg/mL oral solution Take 10 mLs (500 mg total) by mouth every 6 (six) hours. 03/24/15   Katha Hamming, MD    Inpatient Medications:  . allopurinol  150 mg Oral Daily  . amiodarone  200 mg Oral Daily  . antiseptic oral rinse  7 mL Mouth Rinse q12n4p  . aspirin EC  81 mg Oral Daily  . famotidine  20 mg Oral Daily  . furosemide  20 mg Intravenous Q12H  . lactulose  30 g Oral Q6H  . levothyroxine  150 mcg Oral QAC breakfast  . midodrine  5 mg Oral TID WC  . saccharomyces boulardii  250 mg Oral BID      Allergies: No Known Allergies  Social History   Social History  . Marital Status: Married    Spouse Name: [name]  . Number of Children: [name]  . Years of Education: [name]   Occupational History  . Not on file.   Social History Main Topics  . Smoking status: Former Smoker    Types: Cigarettes    Quit date: 03/26/1979  . Smokeless tobacco: Never Used  . Alcohol Use: No  . Drug Use: No  . Sexual Activity: Not on file   Other Topics Concern  . Not on file   Social History Narrative     Family History  Problem Relation Age of Onset  . Diabetes Mellitus II Mother   . Lung cancer Brother      Review of Systems Positive for unable to converse due to obtundation   noted above.  Labs:  Recent Labs  04/06/15 1151  TROPONINI <0.03   Lab Results  Component Value Date   WBC 7.2 04/07/2015   HGB 11.4* 04/07/2015   HCT 34.3* 04/07/2015   MCV 102.4* 04/07/2015   PLT 130* 04/07/2015    Recent Labs Lab 04/07/15 0451  NA 137  K 4.2  CL 108  CO2 23  BUN 47*  CREATININE 2.22*  CALCIUM 9.2  PROT 5.5*  BILITOT 1.7*  ALKPHOS 96  ALT 54  AST 65*  GLUCOSE 93   No results found for: CHOL, HDL, LDLCALC, TRIG No results found for: DDIMER  Radiology/Studies:  Dg Chest 2 View  03/13/2015  CLINICAL DATA:  Weakness since yesterday.   Extremity edema. EXAM: CHEST  2 VIEW COMPARISON:  02/19/2015 FINDINGS: There is mild bilateral interstitial thickening. There is a trace left pleural effusion. There is no focal parenchymal opacity. There is no pneumothorax. There is stable cardiomegaly. There is evidence of prior CABG. There is thoracic aortic atherosclerosis. The osseous structures are unremarkable. IMPRESSION: Findings most concerning for mild CHF. Electronically Signed   By: Elige Ko   On: 03/13/2015 17:49   Ct Head Wo Contrast  04/06/2015  CLINICAL DATA:  Altered mental status EXAM: CT HEAD WITHOUT CONTRAST TECHNIQUE: Contiguous axial images were obtained from the base of the skull through the vertex without intravenous contrast. COMPARISON:  March 20, 2015 FINDINGS: Mild diffuse atrophy is stable.  There is no intracranial mass, hemorrhage, extra-axial fluid collection, or midline shift. There is patchy small vessel disease throughout the centra semiovale bilaterally, stable. There is no new gray-white compartment lesion. No acute infarct evident. Bony calvarium appears intact. The mastoid air cells are clear. Visualized orbits appear symmetric and normal bilaterally. IMPRESSION: Stable atrophy with periventricular small vessel disease. No acute infarct evident. No hemorrhage or mass effect. Electronically Signed   By: Bretta Bang III M.D.   On: 04/06/2015 11:05   Ct Head Wo Contrast  03/20/2015  CLINICAL DATA:  Confusion. EXAM: CT HEAD WITHOUT CONTRAST TECHNIQUE: Contiguous axial images were obtained from the base of the skull through the vertex without intravenous contrast. COMPARISON:  03/13/2015 FINDINGS: Ventricles are normal in configuration. There is ventricular and sulcal enlargement reflecting mild to moderate atrophy. There are no parenchymal masses or mass effect. There are patchy areas of white matter hypoattenuation consistent with moderate chronic microvascular ischemic change. There is no evidence of a  cortical infarct. There are no extra-axial masses or abnormal fluid collections. Small amount of fat along the anterior falx cerebri, stable. No intracranial hemorrhage. Visualized sinuses and mastoid air cells are clear. IMPRESSION: 1. No acute intracranial abnormalities. Stable appearance from the recent prior exam. Electronically Signed   By: Amie Portland M.D.   On: 03/20/2015 13:13   Ct Head Wo Contrast  03/13/2015  CLINICAL DATA:  81 year old male with weakness since yesterday. History of fall yesterday. Prior history of stroke. EXAM: CT HEAD WITHOUT CONTRAST CT CERVICAL SPINE WITHOUT CONTRAST TECHNIQUE: Multidetector CT imaging of the head and cervical spine was performed following the standard protocol without intravenous contrast. Multiplanar CT image reconstructions of the cervical spine were also generated. COMPARISON:  Head CT 07/10/2014. FINDINGS: CT HEAD FINDINGS Mild cerebral atrophy. Patchy and confluent areas of decreased attenuation are noted throughout the deep and periventricular white matter of the cerebral hemispheres bilaterally, compatible with chronic microvascular ischemic disease. Fatty infiltration along the anterior aspect of the falx cerebri again noted, without a well-defined lipoma. No acute displaced skull fractures are identified. No acute intracranial abnormality. Specifically, no evidence of acute post-traumatic intracranial hemorrhage, no definite regions of acute/subacute cerebral ischemia, no focal mass, mass effect, hydrocephalus or abnormal intra or extra-axial fluid collections. The visualized paranasal sinuses and mastoids are well pneumatized. CT CERVICAL SPINE FINDINGS No acute displaced fracture of the cervical spine. Straightening of normal cervical lordosis. Alignment is otherwise anatomic. Prevertebral soft tissues are normal. Multilevel degenerative disc disease, most severe at C6-C7. Multilevel facet arthropathy. Visualized portions of the upper thorax  demonstrates a left pleural effusion. IMPRESSION: 1. No evidence of significant acute traumatic injury to the skull, brain or cervical spine. 2. Left pleural effusion incidentally imaged in the upper left hemithorax. Correlation with chest radiograph is recommended. 3. Mild cerebral atrophy with extensive chronic microvascular ischemic changes in the cerebral white matter. 4. Mild multilevel degenerative disc disease and cervical spondylosis, as above. Electronically Signed   By: Trudie Reed M.D.   On: 03/13/2015 17:49   Ct Cervical Spine Wo Contrast  03/13/2015  CLINICAL DATA:  81 year old male with weakness since yesterday. History of fall yesterday. Prior history of stroke. EXAM: CT HEAD WITHOUT CONTRAST CT CERVICAL SPINE WITHOUT CONTRAST TECHNIQUE: Multidetector CT imaging of the head and cervical spine was performed following the standard protocol without intravenous contrast. Multiplanar CT image reconstructions of the cervical spine were also generated. COMPARISON:  Head CT 07/10/2014. FINDINGS: CT HEAD FINDINGS Mild cerebral atrophy. Patchy  and confluent areas of decreased attenuation are noted throughout the deep and periventricular white matter of the cerebral hemispheres bilaterally, compatible with chronic microvascular ischemic disease. Fatty infiltration along the anterior aspect of the falx cerebri again noted, without a well-defined lipoma. No acute displaced skull fractures are identified. No acute intracranial abnormality. Specifically, no evidence of acute post-traumatic intracranial hemorrhage, no definite regions of acute/subacute cerebral ischemia, no focal mass, mass effect, hydrocephalus or abnormal intra or extra-axial fluid collections. The visualized paranasal sinuses and mastoids are well pneumatized. CT CERVICAL SPINE FINDINGS No acute displaced fracture of the cervical spine. Straightening of normal cervical lordosis. Alignment is otherwise anatomic. Prevertebral soft tissues  are normal. Multilevel degenerative disc disease, most severe at C6-C7. Multilevel facet arthropathy. Visualized portions of the upper thorax demonstrates a left pleural effusion. IMPRESSION: 1. No evidence of significant acute traumatic injury to the skull, brain or cervical spine. 2. Left pleural effusion incidentally imaged in the upper left hemithorax. Correlation with chest radiograph is recommended. 3. Mild cerebral atrophy with extensive chronic microvascular ischemic changes in the cerebral white matter. 4. Mild multilevel degenerative disc disease and cervical spondylosis, as above. Electronically Signed   By: Trudie Reed M.D.   On: 03/13/2015 17:49   Dg Chest Port 1 View  04/06/2015  CLINICAL DATA:  Weakness following colonoscopy 3 days prior. History of atrial fibrillation. EXAM: PORTABLE CHEST 1 VIEW COMPARISON:  March 18, 2015 FINDINGS: There is persistent left base consolidation with small left effusion. Lungs elsewhere clear. Heart is enlarged with pulmonary vascularity within normal limits. There is no appreciable adenopathy. There is atherosclerotic calcification in the aortic arch. Patient is status post coronary artery bypass grafting. IMPRESSION: Persistent consolidation left base with small left effusion. Suspect pneumonia left base. Right lung clear. Stable cardiomegaly. Followup PA and lateral chest radiographs recommended in 3-4 weeks following trial of antibiotic therapy to ensure resolution and exclude underlying malignancy. Electronically Signed   By: Bretta Bang III M.D.   On: 04/06/2015 10:58   Dg Chest Port 1 View  03/18/2015  CLINICAL DATA:  Shortness of breath with history of kidney failure. EXAM: PORTABLE CHEST 1 VIEW COMPARISON:  03/13/2015 FINDINGS: Sternotomy wires are intact. Lordotic technique is demonstrated as the lungs are adequately inflated with minimal linear density of the left base likely atelectasis and possible small amount of left pleural fluid.  Stable cardiomegaly. Remainder of the exam is unchanged. IMPRESSION: Stable left base opacification likely small effusion with atelectasis. Mild stable cardiomegaly. Electronically Signed   By: Elberta Fortis M.D.   On: 03/18/2015 15:21   Dg Abd 2 Views  03/21/2015  CLINICAL DATA:  Followup adynamic ileus. Patient with a history of C-difficile. EXAM: ABDOMEN - 2 VIEW COMPARISON:  03/19/2015 FINDINGS: There is increased gas throughout the bowel including small bowel and colon, but no significant bowel dilation. No significant air-fluid levels. No free air. Calcifications are noted along the aorta and iliac and femoral arteries. Soft tissues otherwise unremarkable. Bones are demineralized degenerative changes of the lumbar spine. IMPRESSION: 1. Increased bowel gas diffusely, with no significant bowel dilation or air-fluid levels on the erect view. Findings may still reflect a mild adynamic ileus. There is no evidence of obstruction no free air. Electronically Signed   By: Amie Portland M.D.   On: 03/21/2015 13:00   Dg Abd 2 Views  03/19/2015  CLINICAL DATA:  81 year old male with abdominal pain, distention, and diarrhea after fall. Initial encounter. EXAM: ABDOMEN - 2 VIEW COMPARISON:  Portable chest radiograph 01/16/2015. FINDINGS: Upright and supine views of the abdomen and pelvis. No pneumoperitoneum. Curvilinear opacity at the left lung base most resembles atelectasis. Stable visualized mediastinal contours. Previous CABG. Redundant colon with widespread large bowel gaseous distension. Retained stool in the right abdomen. Gas-filled but nondilated small bowel loops throughout the visible abdomen. The large bowel gas continues into the pelvis. Cholecystectomy clips. Left inguinal surgical clips. Pelvic phleboliths. No acute osseous abnormality identified. IMPRESSION: 1. Gas throughout small and redundant large bowel loops most suggestive of diffuse ileus. 2. No abdominal free air. 3. Left lower lobe  atelectasis. Electronically Signed   By: Odessa FlemingH  Hall M.D.   On: 03/19/2015 08:36   Dg Hip Unilat With Pelvis 2-3 Views Left  03/15/2015  CLINICAL DATA:  Patient fell from standing 3 days ago onto his left side. Complaining of left hip pain. Unable to rotate leg. EXAM: DG HIP (WITH OR WITHOUT PELVIS) 2-3V LEFT COMPARISON:  None. FINDINGS: No fracture or bone lesion. Hip joints, SI joints and symphysis pubis are normally aligned. No significant hip joint arthropathic change. Bones are demineralized. There are iliac and femoral vascular calcifications. IMPRESSION: No fracture or dislocation. Electronically Signed   By: Amie Portlandavid  Ormond M.D.   On: 03/15/2015 12:56    EKG: Atrial fibrillation  Weights: Filed Weights   04/06/15 1058 04/06/15 2019 04/07/15 0500  Weight: 232 lb (105.235 kg) 209 lb 12.8 oz (95.165 kg) 187 lb 9.6 oz (85.095 kg)     Physical Exam: Blood pressure 106/56, pulse 72, temperature 98.1 F (36.7 C), temperature source Oral, resp. rate 18, height 5\' 9"  (1.753 m), weight 187 lb 9.6 oz (85.095 kg), SpO2 99 %. Body mass index is 27.69 kg/(m^2). General: Well developed, well nourished, in no acute distress. Head eyes ears nose throat: Normocephalic, atraumatic, sclera non-icteric, no xanthomas, nares are without discharge. No apparent thyromegaly and/or mass  Lungs: Normal respiratory effort.  Few wheezes, basilar rales, no rhonchi.  Heart: Irregular with normal S1 S2. no murmur gallop, no rub, PMI is normal size and placement, carotid upstroke normal without bruit, jugular venous pressure is normal Abdomen: Soft, non-tender,  distended with normoactive bowel sounds. Positive hepatomegaly. No rebound/guarding. No obvious abdominal masses. Abdominal aorta is not felt or heard due to increased abdominal girth and ascites  Extremities: 2+ edema. no cyanosis, no clubbing, positive ulcers  Peripheral : 2+ bilateral upper extremity pulses, 0 + bilateral femoral pulses, 0 + bilateral dorsal  pedal pulse Neuro: Not Alert and oriented. No facial asymmetry. No focal deficit. Moves all extremities spontaneously. Musculoskeletal: Normal muscle tone without kyphosis Psych:  Does not Responds to questions appropriately with a normal affect.    Assessment: 81 year old male with chronic nonvalvular atrial fibrillation chronic systolic dysfunction congestive heart failure coronary artery disease with ascites and lower extremity edema concerning for worsening liver failure rather than new exacerbation of cardiovascular disease  Plan: 1. Continue Lasix for maintenance of and/or treatment of congestive heart failure watching closely for chronic kidney disease and/or concerns of dehydration 2. Continue amiodarone for heart rate control and possible maintenance of normal sinus rhythm although would consider discontinuation due to liver failure 3. No further cardiac workup and her treatment today due to no evidence of significant exacerbation of congestive heart failure 4. Further treatment of the elevated ammonia levels causing disorientation  Signed, Lamar BlinksKOWALSKI,Blanche Scovell J M.D. [facility] WorthFACC [facility] 04/07/2015, 4:51 PM

## 2015-04-07 NOTE — Plan of Care (Signed)
Problem: Safety: Goal: Ability to remain free from injury will improve Outcome: Progressing Pt high fall risk. Bed alarm in place. Pt did not present with any impulsiveness.   Problem: Health Behavior/Discharge Planning: Goal: Ability to manage health-related needs will improve Outcome: Progressing Newly admitted with elevated ammonia level of 54. Improved this am to 35 after scheduled doses of Lactulose  Problem: Skin Integrity: Goal: Risk for impaired skin integrity will decrease Outcome: Progressing Pt with BUE weeping. Generalized edema worse in BLE. Skin tear to right arm with clean, dry and intact dressing. Foam to sacrum for protection.   Problem: Activity: Goal: Risk for activity intolerance will decrease Outcome: Progressing [place] Family said that he has not had therapy due to weakness.   Problem: Nutrition: Goal: Adequate nutrition will be maintained Outcome: Progressing Pt needs assistance with meals. Family did not report any recent weight loss.

## 2015-04-07 NOTE — [place] FL2 (Signed)
[place] MEDICAID FL2 LEVEL OF CARE SCREENING TOOL     IDENTIFICATION  Patient Name: Blake White Birthdate: 01/04/1934 Sex: male Admission Date (Current Location): 04/06/2015  [id number]   Facility and Address:  [facility], [address], [place], [place] [address]      Provider Number: [id number]  Attending Physician Name and Address:  Altamese DillingVaibhavkumar Stokely Jeancharles, MD  Relative Name and Phone Number:       Current Level of Care: Hospital Recommended Level of Care: Skilled Nursing Facility Prior Approval Number:    Date Approved/Denied:   PASRR Number: [id number]  Discharge Plan: SNF    Current Diagnoses: Patient Active Problem List   Diagnosis Date Noted  . Hepatic encephalopathy (HCC) 04/06/2015  . Diarrhea of presumed infectious origin   . C. difficile colitis   . Recurrent colitis due to Clostridium difficile 03/13/2015  . Acute kidney injury (HCC) 01/16/2015  . Abdominal pain 01/10/2015  . Diarrhea 01/10/2015    Orientation ACTIVITIES/SOCIAL BLADDER RESPIRATION    Self, Time, Situation, Place  Active, Family supportive Continent Normal  BEHAVIORAL SYMPTOMS/MOOD NEUROLOGICAL BOWEL NUTRITION STATUS      Continent Diet  PHYSICIAN VISITS COMMUNICATION OF NEEDS Height & Weight Skin  30 days Verbally 5\' 9"  (175.3 cm) 187 lbs. Normal          AMBULATORY STATUS RESPIRATION    Assist extensive Normal      Personal Care Assistance Level of Assistance  Bathing, Dressing Bathing Assistance: Limited assistance   Dressing Assistance: Limited assistance      Functional Limitations Info  Sight, Hearing, Speech Sight Info: Adequate Hearing Info: Adequate Speech Info: Impaired       SPECIAL CARE FACTORS FREQUENCY  PT (By licensed PT)     PT Frequency: 5x week             Additional Factors Info  Isolation Precautions, Allergies   Allergies Info: nka           Current Medications  (04/07/2015):  This is the current hospital active medication list Current Facility-Administered Medications  Medication Dose Route Frequency Provider Last Rate Last Dose  . acetaminophen (TYLENOL) tablet 650 mg  650 mg Oral Q6H PRN Milagros LollSrikar Sudini, MD       Or  . acetaminophen (TYLENOL) suppository 650 mg  650 mg Rectal Q6H PRN Srikar Sudini, MD      . albuterol (PROVENTIL) (2.5 MG/3ML) 0.083% nebulizer solution 2.5 mg  2.5 mg Nebulization Q2H PRN Srikar Sudini, MD      . allopurinol (ZYLOPRIM) tablet 150 mg  150 mg Oral Daily Srikar Sudini, MD      . amiodarone (PACERONE) tablet 200 mg  200 mg Oral Daily Srikar Sudini, MD   200 mg at 04/06/15 2126  . antiseptic oral rinse (CPC / CETYLPYRIDINIUM CHLORIDE 0.05%) solution 7 mL  7 mL Mouth Rinse q12n4p Srikar Sudini, MD      . aspirin EC tablet 81 mg  81 mg Oral Daily Srikar Sudini, MD   81 mg at 04/06/15 2126  . famotidine (PEPCID) tablet 20 mg  20 mg Oral Daily Srikar Sudini, MD   20 mg at 04/06/15 2126  . furosemide (LASIX) injection 20 mg  20 mg Intravenous Q12H Altamese DillingVaibhavkumar Kross Swallows, MD      . lactulose (CHRONULAC) 10 GM/15ML solution 30 g  30 g Oral Q6H Srikar Sudini, MD   30 g at 04/07/15 0557  . levothyroxine (SYNTHROID, LEVOTHROID) tablet 150 mcg  150 mcg Oral QAC breakfast Milagros Loll, MD   150 mcg at 04/07/15 0800  . midodrine (PROAMATINE) tablet 5 mg  5 mg Oral TID WC Srikar Sudini, MD   5 mg at 04/07/15 0800  . ondansetron (ZOFRAN) tablet 4 mg  4 mg Oral Q6H PRN Milagros Loll, MD       Or  . ondansetron (ZOFRAN) injection 4 mg  4 mg Intravenous Q6H PRN Srikar Sudini, MD      . oxyCODONE (Oxy IR/ROXICODONE) immediate release tablet 5 mg  5 mg Oral Q4H PRN Srikar Sudini, MD      . saccharomyces boulardii (FLORASTOR) capsule 250 mg  250 mg Oral BID Milagros Loll, MD   250 mg at 04/06/15 2256  . traMADol (ULTRAM) tablet 50-100 mg  50-100 mg Oral Q12H PRN Milagros Loll, MD         Discharge Medications: Please see discharge summary  for a list of discharge medications.  Relevant Imaging Results:  Relevant Lab Results:  Recent Labs    Additional Information SS: [id number]  Cheron Schaumann, LCSW

## 2015-04-07 NOTE — Plan of Care (Signed)
Dr. Mechele CollinElliott states that AMS more significant than when he was here last week.  May be consequence of dehydration.  PO intake very poor and pt has had multiple loose stools. Lactulose now on hold.  Pt's ammonia level improved from 53 to 35.  Pt now receiving LR.  Pt's albumin level very low 2.4 - pt has liver cirrhosis.  Clearly third spacing - abd taut and distended.  May need U/S of abd .  Pt has improved w/fluids running.  Had wound consult today - BLE cellulits wrapped in zinc and coban - to be done weekly.  May come back and do it on Monday.  Arms are weeping and wound nurse dressed them in ca alginate and gauze.

## 2015-04-07 NOTE — Progress Notes (Signed)
[facility] [place] at [facility]   PATIENT NAME: Blake MangesBobby White    MR#:  [id number]  DATE OF BIRTH:  06/09/1933  SUBJECTIVE:  CHIEF COMPLAINT:   Chief Complaint  Patient presents with  . Weakness    Pt is confused.  REVIEW OF SYSTEMS:   Pt is confused.not able to get ROS from him.  ROS  DRUG ALLERGIES:  No Known Allergies  VITALS:  Blood pressure 106/56, pulse 72, temperature 98.1 F (36.7 C), temperature source Oral, resp. rate 18, height 5\' 9"  (1.753 m), weight 85.095 kg (187 lb 9.6 oz), SpO2 99 %.  PHYSICAL EXAMINATION:  GENERAL:  81 y.o.-year-old patient lying in the bed with no acute distress.  EYES: Pupils equal, round, reactive to light and accommodation. No scleral icterus.  HEENT: Head atraumatic, normocephalic. Oropharynx and nasopharynx clear.  NECK:  Supple, no jugular venous distention. No thyroid enlargement, no tenderness.  LUNGS: Normal breath sounds bilaterally, no wheezing, some crepitation. No use of accessory muscles of respiration.  CARDIOVASCULAR: S1, S2 normal. No murmurs, rubs, or gallops.  ABDOMEN: Soft, nontender, distended. Bowel sounds present. No organomegaly or mass.  EXTREMITIES: severe pedal edema, cyanosis, or clubbing. Some skin breakdowns with oozing fluids.  NEUROLOGIC: drowsy, moves limbs slowly on arousal, tremors present. PSYCHIATRIC: The patient is drowsy.  SKIN: No obvious rash, lesion, or ulcer.   Physical Exam LABORATORY PANEL:   CBC  Recent Labs Lab 04/07/15 0451  WBC 7.2  HGB 11.4*  HCT 34.3*  PLT 130*   ------------------------------------------------------------------------------------------------------------------  Chemistries   Recent Labs Lab 04/07/15 0451  NA 137  K 4.2  CL 108  CO2 23  GLUCOSE 93  BUN 47*  CREATININE 2.22*  CALCIUM 9.2  AST 65*  ALT 54  ALKPHOS 96  BILITOT 1.7*    ------------------------------------------------------------------------------------------------------------------  Cardiac Enzymes  Recent Labs Lab 04/06/15 1151  TROPONINI <0.03   ------------------------------------------------------------------------------------------------------------------  RADIOLOGY:  Ct Head Wo Contrast  04/06/2015  CLINICAL DATA:  Altered mental status EXAM: CT HEAD WITHOUT CONTRAST TECHNIQUE: Contiguous axial images were obtained from the base of the skull through the vertex without intravenous contrast. COMPARISON:  March 20, 2015 FINDINGS: Mild diffuse atrophy is stable. There is no intracranial mass, hemorrhage, extra-axial fluid collection, or midline shift. There is patchy small vessel disease throughout the centra semiovale bilaterally, stable. There is no new gray-White compartment lesion. No acute infarct evident. Bony calvarium appears intact. The mastoid air cells are clear. Visualized orbits appear symmetric and normal bilaterally. IMPRESSION: Stable atrophy with periventricular small vessel disease. No acute infarct evident. No hemorrhage or mass effect. Electronically Signed   By: Bretta BangWilliam  Woodruff III M.D.   On: 04/06/2015 11:05   Dg Chest Port 1 View  04/06/2015  CLINICAL DATA:  Weakness following colonoscopy 3 days prior. History of atrial fibrillation. EXAM: PORTABLE CHEST 1 VIEW COMPARISON:  March 18, 2015 FINDINGS: There is persistent left base consolidation with small left effusion. Lungs elsewhere clear. Heart is enlarged with pulmonary vascularity within normal limits. There is no appreciable adenopathy. There is atherosclerotic calcification in the aortic arch. Patient is status post coronary artery bypass grafting. IMPRESSION: Persistent consolidation left base with small left effusion. Suspect pneumonia left base. Right lung clear. Stable cardiomegaly. Followup PA and lateral chest radiographs recommended in 3-4 weeks following trial of  antibiotic therapy to ensure resolution and exclude underlying malignancy. Electronically Signed   By: Bretta BangWilliam  Woodruff III M.D.   On: 04/06/2015 10:58    ASSESSMENT  AND PLAN:   Active Problems:   Hepatic encephalopathy (HCC)  * Hepatic encephalopathy Started on lactulose and rifaximin. lower Repeat ammonia level. Consult GI.  * Lower extremity stasis changes Wife at bedside insists that his legs have looked the same for a long time. Patient is afebrile and has normal White count. Would try to limit any antibiotic use in this patient with recurrent C. Difficile. Likely swelling is contributed from liver failure.  * UTI? Wait for cultures. No antibiotics.  I have discussed with family regarding seeing a response with lactulose in his mental status and weakness. If he is not improving and we might have to start him on antibiotics for lower extremity cellulitis/UTI. But not for now- as infection less likely.  * CKD stage IV is stable  * Atrial fibrillation No tachycardia. Continue medications along with Coumadin.  * ac systolic CHF   Low E per past Echo.   IV lasix- cardio consult.  * DVT prophylaxis On Coumadin   All the records are reviewed and case discussed with Care Management/Social Workerr. Management plans discussed with the patient, family and they are in agreement.  CODE STATUS: full  TOTAL TIME TAKING CARE OF THIS PATIENT: 50 minutes.  i had long discussion with wife, son, daughter-in-law and granddaughter.  Explained them about findings and possible slow or incomplete recovery due to multiple complicating issues. Answered their all questions.   POSSIBLE D/C IN 3-4 DAYS, DEPENDING ON CLINICAL CONDITION.   Altamese Dilling M.D on 04/07/2015   Between 7am to 6pm - Pager - [phone]  After 6pm go to [email or web address] - password [email or web address]  [facility]  Office  [phone]  CC: Primary care physician; Sula Rumple, MD  Note:  This dictation was prepared with Dragon dictation along with smaller phrase technology. Any transcriptional errors that result from this process are unintentional.

## 2015-04-08 DIAGNOSIS — Z8673 Personal history of transient ischemic attack (TIA), and cerebral infarction without residual deficits: Secondary | ICD-10-CM

## 2015-04-08 DIAGNOSIS — K7290 Hepatic failure, unspecified without coma: Principal | ICD-10-CM

## 2015-04-08 DIAGNOSIS — R609 Edema, unspecified: Secondary | ICD-10-CM

## 2015-04-08 DIAGNOSIS — Z515 Encounter for palliative care: Secondary | ICD-10-CM

## 2015-04-08 DIAGNOSIS — Z66 Do not resuscitate: Secondary | ICD-10-CM

## 2015-04-08 DIAGNOSIS — N400 Enlarged prostate without lower urinary tract symptoms: Secondary | ICD-10-CM

## 2015-04-08 DIAGNOSIS — K7460 Unspecified cirrhosis of liver: Secondary | ICD-10-CM

## 2015-04-08 DIAGNOSIS — E669 Obesity, unspecified: Secondary | ICD-10-CM

## 2015-04-08 DIAGNOSIS — I959 Hypotension, unspecified: Secondary | ICD-10-CM

## 2015-04-08 DIAGNOSIS — K219 Gastro-esophageal reflux disease without esophagitis: Secondary | ICD-10-CM

## 2015-04-08 DIAGNOSIS — Z87891 Personal history of nicotine dependence: Secondary | ICD-10-CM

## 2015-04-08 DIAGNOSIS — E785 Hyperlipidemia, unspecified: Secondary | ICD-10-CM

## 2015-04-08 DIAGNOSIS — E039 Hypothyroidism, unspecified: Secondary | ICD-10-CM

## 2015-04-08 DIAGNOSIS — N529 Male erectile dysfunction, unspecified: Secondary | ICD-10-CM

## 2015-04-08 DIAGNOSIS — R531 Weakness: Secondary | ICD-10-CM

## 2015-04-08 DIAGNOSIS — I2510 Atherosclerotic heart disease of native coronary artery without angina pectoris: Secondary | ICD-10-CM

## 2015-04-08 DIAGNOSIS — Z8619 Personal history of other infectious and parasitic diseases: Secondary | ICD-10-CM

## 2015-04-08 DIAGNOSIS — J189 Pneumonia, unspecified organism: Secondary | ICD-10-CM

## 2015-04-08 DIAGNOSIS — Z87440 Personal history of urinary (tract) infections: Secondary | ICD-10-CM

## 2015-04-08 DIAGNOSIS — R188 Other ascites: Secondary | ICD-10-CM

## 2015-04-08 DIAGNOSIS — I252 Old myocardial infarction: Secondary | ICD-10-CM

## 2015-04-08 DIAGNOSIS — I509 Heart failure, unspecified: Secondary | ICD-10-CM

## 2015-04-08 DIAGNOSIS — M109 Gout, unspecified: Secondary | ICD-10-CM

## 2015-04-08 DIAGNOSIS — I4891 Unspecified atrial fibrillation: Secondary | ICD-10-CM

## 2015-04-08 DIAGNOSIS — L03119 Cellulitis of unspecified part of limb: Secondary | ICD-10-CM

## 2015-04-08 DIAGNOSIS — J90 Pleural effusion, not elsewhere classified: Secondary | ICD-10-CM

## 2015-04-08 LAB — COMPREHENSIVE METABOLIC PANEL
ALT: 43 U/L (ref 17–63)
ANION GAP: 5 (ref 5–15)
AST: 55 U/L — ABNORMAL HIGH (ref 15–41)
Albumin: 2.2 g/dL — ABNORMAL LOW (ref 3.5–5.0)
Alkaline Phosphatase: 89 U/L (ref 38–126)
BUN: 49 mg/dL — ABNORMAL HIGH (ref 6–20)
CHLORIDE: 107 mmol/L (ref 101–111)
CO2: 23 mmol/L (ref 22–32)
CREATININE: 2.27 mg/dL — AB (ref 0.61–1.24)
Calcium: 8.9 mg/dL (ref 8.9–10.3)
GFR, EST AFRICAN AMERICAN: 30 mL/min — AB (ref >60)
GFR, EST NON AFRICAN AMERICAN: 26 mL/min — AB (ref >60)
Glucose, Bld: 95 mg/dL (ref 65–99)
Potassium: 3.5 mmol/L (ref 3.5–5.1)
Sodium: 135 mmol/L (ref 135–145)
Total Bilirubin: 1.6 mg/dL — ABNORMAL HIGH (ref 0.3–1.2)
Total Protein: 5.2 g/dL — ABNORMAL LOW (ref 6.5–8.1)

## 2015-04-08 LAB — URINE CULTURE

## 2015-04-08 LAB — PROTIME-INR
INR: 3.14
Prothrombin Time: 31.7 seconds — ABNORMAL HIGH (ref 11.4–15.0)

## 2015-04-08 MED ORDER — LACTULOSE 10 GM/15ML PO SOLN
30.0000 g | Freq: Two times a day (BID) | ORAL | Status: DC
  Administered 2015-04-08 – 2015-04-12 (×10): 30 g via ORAL
  Filled 2015-04-08 (×11): qty 60

## 2015-04-08 NOTE — Progress Notes (Signed)
Initial Nutrition Assessment     INTERVENTION:  Meals and snacks: Cater to pt preferences Medical Nutrition Supplement Therapy: Recommend ensure BID for added nutrition   NUTRITION DIAGNOSIS:   Inadequate oral intake related to chronic illness as evidenced by per patient/family report.    GOAL:   Patient will meet greater than or equal to 90% of their needs    MONITOR:    (Energy intake, Digestive system)  REASON FOR ASSESSMENT:   Diagnosis    ASSESSMENT:      Pt admitted with hepatic encephalopathy.  Recent fecal transplant  Past Medical History  Diagnosis Date  . CHF (congestive heart failure) (HCC)   . BPH (benign prostatic hyperplasia)   . A-fib (HCC)   . Thyroid disease   . High cholesterol   . Bilateral lower extremity edema   . CAD (coronary artery disease)   . Cirrhosis (HCC)   . CVA (cerebral vascular accident) (HCC)   . Gout   . Hypothyroidism   . Myocardial infarction (HCC)   . Obesity   . Dysrhythmia   . History of chickenpox   . ED (erectile dysfunction)     Current Nutrition: ate few bites of breakfast this am.    Food/Nutrition-Related History: For the past 3-4 days prior to admission poor po intake per family   Scheduled Medications:  . allopurinol  150 mg Oral Daily  . amiodarone  200 mg Oral Daily  . antiseptic oral rinse  7 mL Mouth Rinse q12n4p  . aspirin EC  81 mg Oral Daily  . famotidine  20 mg Oral Daily  . lactulose  30 g Oral BID  . levothyroxine  150 mcg Oral QAC breakfast  . midodrine  5 mg Oral TID WC  . saccharomyces boulardii  250 mg Oral BID    Continuous Medications:  . lactated ringers 80 mL/hr at 04/08/15 1102     Electrolyte/Renal Profile and Glucose Profile:   Recent Labs Lab 04/06/15 1151 04/07/15 0451 04/08/15 0424  NA 135 137 135  K 4.6 4.2 3.5  CL 105 108 107  CO2 24 23 23   BUN 51* 47* 49*  CREATININE 2.37* 2.22* 2.27*  CALCIUM 9.2 9.2 8.9  GLUCOSE 110* 93 95   Protein Profile:   Recent Labs Lab 04/06/15 1151 04/07/15 0451 04/08/15 0424  ALBUMIN 2.5* 2.4* 2.2*    Gastrointestinal Profile: Last BM:12/01   Nutrition-Focused Physical Exam Findings:  Unable to complete Nutrition-Focused physical exam at this time.     Weight Change: 11% weight loss in the last 2 weeks    Diet Order:  Diet Heart Room service appropriate?: Yes; Fluid consistency:: Thin  Skin:   reviewed   Height:   Ht Readings from Last 1 Encounters:  04/06/15 5\' 9"  (1.753 m)    Weight:   Wt Readings from Last 1 Encounters:  04/08/15 196 lb 12.8 oz (89.268 kg)    Ideal Body Weight:     BMI:  Body mass index is 29.05 kg/(m^2).  Estimated Nutritional Needs:   Kcal:  Using IBW of 73kg BEE 1435 kcals (IF 1.1-1.3, AF 1.2) [id number] kcals/d  Protein:  (1.0-1.2 g/kg) 73-88 g/d  Fluid:  (25-1130ml/kg) 1825-2110790ml/d  EDUCATION NEEDS:   No education needs identified at this time  MODERATE Care Level  Yittel Emrich B. Freida BusmanAllen, RD, LDN [phone] (pager)

## 2015-04-08 NOTE — Plan of Care (Addendum)
Problem: Nutrition: Goal: Adequate nutrition will be maintained Outcome: Progressing Plan of care Injury free/safe environment: pt high fall risk, bed alarm in place. Pt unable to utilize call bell, so frequent rounds made during the night.  Skin integrity: zinc and coban wraps to ble. Dressings to bilateral arms intact as well. Sacral dressing in place to preserve skin integrity. Pt turned and repositioned with heels floated to further prevent skin breakdown. Activity intolerance: Pt tolerated turning and repositioning. Pt did not get oob during this shift. Slept the majority of the night. Nutrition: Pt tolerating diet. Needs assistance with meals. IV fluids infusing at 13225mls/hr.  Generalized edema +2 to +3.  Numerous bouts of loose stools. Lactulose placed on hold yesterday. Pt on enteric precautions.

## 2015-04-08 NOTE — Consult Note (Signed)
I had a talk with the patient's wife, daughter, and son and told them that the patient was dying of multiorgan failure and that further aggressive intervention would not be helpful since treatment of one problem makes other problems worse and that comfort measures should be paramount.  Discussed hospice but they at this time want to take him home, hopefully with ample home heath assistance.  I do agree with treatment of his UTI with a narrow spectrum antibiotic would be reasonable, since his stool is now neg for C. Diff he is less likely to get recurrent C. Diff. But this can always happen from reinfection from being in the hospital or nursing home.

## 2015-04-08 NOTE — Progress Notes (Signed)
ANTICOAGULATION CONSULT NOTE - Follow Up Consult  Pharmacy Consult for wafarin Indication: atrial fibrillation  No Known Allergies  Patient Measurements: Height: 5\' 9"  (175.3 cm) Weight: 196 lb 12.8 oz (89.268 kg) IBW/kg (Calculated) : 70.7   Vital Signs: Temp: 97.5 F (36.4 C) (12/01 0526) Temp Source: Oral (12/01 0526) BP: 102/47 mmHg (12/01 0526) Pulse Rate: 65 (12/01 0526)  Labs:  Recent Labs  04/06/15 1151 04/07/15 0451 04/08/15 0424  HGB 11.7* 11.4*  --   HCT 35.7* 34.3*  --   PLT 149* 130*  --   LABPROT  --  31.7* 31.7*  INR  --  3.14 3.14  CREATININE 2.37* 2.22*  --   TROPONINI <0.03  --   --     Estimated Creatinine Clearance: 29.8 mL/min (by C-G formula based on Cr of 2.22).   Medical History: Past Medical History  Diagnosis Date  . CHF (congestive heart failure) (HCC)   . BPH (benign prostatic hyperplasia)   . A-fib (HCC)   . Thyroid disease   . High cholesterol   . Bilateral lower extremity edema   . CAD (coronary artery disease)   . Cirrhosis (HCC)   . CVA (cerebral vascular accident) (HCC)   . Gout   . Hypothyroidism   . Myocardial infarction (HCC)   . Obesity   . Dysrhythmia   . History of chickenpox   . ED (erectile dysfunction)     Medications:  Scheduled:  . allopurinol  150 mg Oral Daily  . amiodarone  200 mg Oral Daily  . antiseptic oral rinse  7 mL Mouth Rinse q12n4p  . aspirin EC  81 mg Oral Daily  . famotidine  20 mg Oral Daily  . furosemide  20 mg Intravenous Q12H  . lactulose  30 g Oral BID  . levothyroxine  150 mcg Oral QAC breakfast  . midodrine  5 mg Oral TID WC  . saccharomyces boulardii  250 mg Oral BID    Assessment: Pharmacy consulted to dose wafarin in a 81 yo male with atrial fibrillation. PTA wafarin dosing of 2 mg po daily.  INR History: 11/30- INR: 3.14, warfarin held 12/1 - INR: 3.14  Goal of Therapy:  INR 2-3 Monitor platelets by anticoagulation protocol: Yes   Plan:  Will continue to hold  warfarin as INR remains unchanged from yesterday.  Patient with history of cirrhosis that may contributing to elevated INR. Will recheck INR in AM.  If INR is 3 or less, would consider restarting lower dose of warfarin 1.5 mg po daily.   Pharmacy will continue to follow.   Kamryn Messineo G 04/08/2015,8:23 AM

## 2015-04-08 NOTE — Progress Notes (Signed)
[facility] [place] [facility]   PATIENT NAME: Blake MangesBobby Lager    MR#:  [id number]  DATE OF BIRTH:  09/02/1933  SUBJECTIVE:  CHIEF COMPLAINT:   Chief Complaint  Patient presents with  . Weakness    Pt is confused. More drowsy. Abdomen more distended.  REVIEW OF SYSTEMS:   Pt is confused.not able to get ROS from him.  ROS  DRUG ALLERGIES:  No Known Allergies  VITALS:  Blood pressure 104/48, pulse 62, temperature 97.7 F (36.5 C), temperature source Oral, resp. rate 20, height 5\' 9"  (1.753 m), weight 89.268 kg (196 lb 12.8 oz), SpO2 99 %.  PHYSICAL EXAMINATION:  GENERAL:  81 y.o.-year-old patient lying in the bed with no acute distress.  EYES: Pupils equal, round, reactive to light and accommodation. No scleral icterus.  HEENT: Head atraumatic, normocephalic. Oropharynx and nasopharynx clear.  NECK:  Supple, no jugular venous distention. No thyroid enlargement, no tenderness.  LUNGS: Normal breath sounds bilaterally, no wheezing, some crepitation. No use of accessory muscles of respiration.  CARDIOVASCULAR: S1, S2 normal. No murmurs, rubs, or gallops.  ABDOMEN: Soft, nontender, distended. Bowel sounds present. No organomegaly or mass.  EXTREMITIES: severe pedal edema, cyanosis, or clubbing. Some skin breakdowns with oozing fluids.  NEUROLOGIC: drowsy, minimal arousal. PSYCHIATRIC: The patient is drowsy.  SKIN: No obvious rash, lesion, or ulcer.   Physical Exam LABORATORY PANEL:   CBC  Recent Labs Lab 04/07/15 0451  WBC 7.2  HGB 11.4*  HCT 34.3*  PLT 130*   ------------------------------------------------------------------------------------------------------------------  Chemistries   Recent Labs Lab 04/08/15 0424  NA 135  K 3.5  CL 107  CO2 23  GLUCOSE 95  BUN 49*  CREATININE 2.27*  CALCIUM 8.9  AST 55*  ALT 43  ALKPHOS 89  BILITOT 1.6*    ------------------------------------------------------------------------------------------------------------------  Cardiac Enzymes  Recent Labs Lab 04/06/15 1151  TROPONINI <0.03   ------------------------------------------------------------------------------------------------------------------  RADIOLOGY:  No results found.  ASSESSMENT AND PLAN:   Active Problems:   Hepatic encephalopathy (HCC)  * Hepatic encephalopathy and altered mental status due to infection Started on lactulose and rifaximin. lower Repeat ammonia level. Appreciated Consult GI.   Patient still remains drowsy,  His urine culture reported positive with resistant Klebsiella.  Maybe this was also playing a role in his altered mental status.  * Lower extremity stasis changes Wife at bedside insists that his legs have looked the same for a long time. Patient is afebrile and has normal white count. Would try to limit any antibiotic use in this patient with recurrent C. Difficile. Likely swelling is contributed from liver failure.  Continue elastic wraps.  * UTI  Reviewed urine culture and it is resistant Klebsiella.  Called ID consult to help in guiding therapy.  * CKD stage IV is stable  * Atrial fibrillation No tachycardia. Continue medications along with Coumadin.  * ac systolic CHF   Low E per past Echo.   IV lasix-appreciated cardio consult.  * DVT prophylaxis On Coumadin  * abdominal distention   Will do US abdomen for possible ascites.      All the records are reviewed and case discussed with Care Management/Social Workerr. Management plans discussed with the patient, family and they are in agreement.  CODE STATUS: full  TOTAL TIME TAKING CARE OF THIS PATIENT: 50 minutes.  i had long discussion with wife, and daughter on phone.  Explained them about findings and possible slow or incomplete recovery due to multiple complicating issues. Answered  their all questions. Also discussed  the case with GI and ID consultants.  POSSIBLE D/C IN 3-4 DAYS, DEPENDING ON CLINICAL CONDITION.   Altamese Dilling M.D on 04/08/2015   Between 7am to 6pm - Pager - [phone]  After 6pm go to [email or web address] - password [email or web address]  [facility]  Office  [phone]  CC: Primary care physician; Sula Rumple, MD  Note: This dictation was prepared with Dragon dictation along with smaller phrase technology. Any transcriptional errors that result from this process are unintentional.

## 2015-04-08 NOTE — Progress Notes (Signed)
[facility] Encounter Note  Patient: Blake White / Admit Date: 04/06/2015 / Date of Encounter: 04/08/2015, 8:54 AM   Subjective: No significant change in status today with continued lower extremity edema and some difficulty with somnolence no evidence of chest discomfort or shortness of breath consistent with heart failure  Review of Systems: Positive for: Lower extremity edema and shortness of breath and weakness Negative for: Vision change, hearing change, syncope, dizziness, nausea, vomiting,diarrhea, bloody stool, stomach pain, cough, congestion, diaphoresis, urinary frequency, urinary pain,skin lesions, skin rashes Others previously listed  Objective: Telemetry: Heart rate controlled which appears to be normal sinus rhythm although no P waves are seen Physical Exam: Blood pressure 102/47, pulse 65, temperature 97.5 F (36.4 C), temperature source Oral, resp. rate 20, height  (1.753 m), weight 196 lb 12.8 oz (89.268 kg), SpO2 99 %. Body mass index is 29.05 kg/(m^2). General: Well developed, well nourished, in no acute distress. Head: Normocephalic, atraumatic, sclera non-icteric, no xanthomas, nares are without discharge. Neck: No apparent masses Lungs: Normal respirations with few wheezes, no rhonchi, no rales , no crackles   Heart: Regular rate and rhythm, normal S1 S2, 2+ apical murmur, no rub, no gallop, PMI is normal size and placement, carotid upstroke normal without bruit, jugular venous pressure normal Abdomen: Soft, non-tender,  distended with normoactive bowel sounds. Positive hepatosplenomegaly. Abdominal aorta is not felt or heard Extremities: 2+ edema, no clubbing, no cyanosis, positive ulcers,  Peripheral: 2+ radial, 0 + femoral, 0 + dorsal pedal pulses Neuro: Alert and oriented. Moves all extremities spontaneously. Psych:  Responds to questions appropriately with a normal affect.   Intake/Output Summary (Last 24 hours) at 04/08/15  0854 Last data filed at 04/08/15 0554  Gross per 24 hour  Intake    120 ml  Output    400 ml  Net   -280 ml    Inpatient Medications:  . allopurinol  150 mg Oral Daily  . amiodarone  200 mg Oral Daily  . antiseptic oral rinse  7 mL Mouth Rinse q12n4p  . aspirin EC  81 mg Oral Daily  . famotidine  20 mg Oral Daily  . furosemide  20 mg Intravenous Q12H  . lactulose  30 g Oral BID  . levothyroxine  150 mcg Oral QAC breakfast  . midodrine  5 mg Oral TID WC  . saccharomyces boulardii  250 mg Oral BID   Infusions:  . lactated ringers 80 mL/hr at 04/08/15 0748    Labs:  Recent Labs  04/06/15 1151 04/07/15 0451  NA 135 137  K 4.6 4.2  CL 105 108  CO2 24 23  GLUCOSE 110* 93  BUN 51* 47*  CREATININE 2.37* 2.22*  CALCIUM 9.2 9.2    Recent Labs  04/06/15 1151 04/07/15 0451  AST 75* 65*  ALT 58 54  ALKPHOS 100 96  BILITOT 1.5* 1.7*  PROT 5.6* 5.5*  ALBUMIN 2.5* 2.4*    Recent Labs  04/06/15 1151 04/07/15 0451  WBC 9.0 7.2  NEUTROABS 6.9*  --   HGB 11.7* 11.4*  HCT 35.7* 34.3*  MCV 101.8* 102.4*  PLT 149* 130*    Recent Labs  04/06/15 1151  TROPONINI <0.03   Invalid input(s): POCBNP No results for input(s): HGBA1C in the last 72 hours.   Weights: Filed Weights   04/06/15 2019 04/07/15 0500 04/08/15 0500  Weight: 209 lb 12.8 oz (95.165 kg) 187 lb 9.6 oz (85.095 kg) 196 lb 12.8 oz (89.268 kg)  Radiology/Studies:  Dg Chest 2 View  03/13/2015  CLINICAL DATA:  Weakness since yesterday.  Extremity edema. EXAM: CHEST  2 VIEW COMPARISON:  02/19/2015 FINDINGS: There is mild bilateral interstitial thickening. There is a trace left pleural effusion. There is no focal parenchymal opacity. There is no pneumothorax. There is stable cardiomegaly. There is evidence of prior CABG. There is thoracic aortic atherosclerosis. The osseous structures are unremarkable. IMPRESSION: Findings most concerning for mild CHF. Electronically Signed   By: Elige Ko   On:  03/13/2015 17:49   Ct Head Wo Contrast  04/06/2015  CLINICAL DATA:  Altered mental status EXAM: CT HEAD WITHOUT CONTRAST TECHNIQUE: Contiguous axial images were obtained from the base of the skull through the vertex without intravenous contrast. COMPARISON:  March 20, 2015 FINDINGS: Mild diffuse atrophy is stable. There is no intracranial mass, hemorrhage, extra-axial fluid collection, or midline shift. There is patchy small vessel disease throughout the centra semiovale bilaterally, stable. There is no new gray-white compartment lesion. No acute infarct evident. Bony calvarium appears intact. The mastoid air cells are clear. Visualized orbits appear symmetric and normal bilaterally. IMPRESSION: Stable atrophy with periventricular small vessel disease. No acute infarct evident. No hemorrhage or mass effect. Electronically Signed   By: Bretta Bang III M.D.   On: 04/06/2015 11:05   Ct Head Wo Contrast  03/20/2015  CLINICAL DATA:  Confusion. EXAM: CT HEAD WITHOUT CONTRAST TECHNIQUE: Contiguous axial images were obtained from the base of the skull through the vertex without intravenous contrast. COMPARISON:  03/13/2015 FINDINGS: Ventricles are normal in configuration. There is ventricular and sulcal enlargement reflecting mild to moderate atrophy. There are no parenchymal masses or mass effect. There are patchy areas of white matter hypoattenuation consistent with moderate chronic microvascular ischemic change. There is no evidence of a cortical infarct. There are no extra-axial masses or abnormal fluid collections. Small amount of fat along the anterior falx cerebri, stable. No intracranial hemorrhage. Visualized sinuses and mastoid air cells are clear. IMPRESSION: 1. No acute intracranial abnormalities. Stable appearance from the recent prior exam. Electronically Signed   By: Amie Portland M.D.   On: 03/20/2015 13:13   Ct Head Wo Contrast  03/13/2015  CLINICAL DATA:  81 year old male with weakness  since yesterday. History of fall yesterday. Prior history of stroke. EXAM: CT HEAD WITHOUT CONTRAST CT CERVICAL SPINE WITHOUT CONTRAST TECHNIQUE: Multidetector CT imaging of the head and cervical spine was performed following the standard protocol without intravenous contrast. Multiplanar CT image reconstructions of the cervical spine were also generated. COMPARISON:  Head CT 07/10/2014. FINDINGS: CT HEAD FINDINGS Mild cerebral atrophy. Patchy and confluent areas of decreased attenuation are noted throughout the deep and periventricular white matter of the cerebral hemispheres bilaterally, compatible with chronic microvascular ischemic disease. Fatty infiltration along the anterior aspect of the falx cerebri again noted, without a well-defined lipoma. No acute displaced skull fractures are identified. No acute intracranial abnormality. Specifically, no evidence of acute post-traumatic intracranial hemorrhage, no definite regions of acute/subacute cerebral ischemia, no focal mass, mass effect, hydrocephalus or abnormal intra or extra-axial fluid collections. The visualized paranasal sinuses and mastoids are well pneumatized. CT CERVICAL SPINE FINDINGS No acute displaced fracture of the cervical spine. Straightening of normal cervical lordosis. Alignment is otherwise anatomic. Prevertebral soft tissues are normal. Multilevel degenerative disc disease, most severe at C6-C7. Multilevel facet arthropathy. Visualized portions of the upper thorax demonstrates a left pleural effusion. IMPRESSION: 1. No evidence of significant acute traumatic injury to the skull, brain or  cervical spine. 2. Left pleural effusion incidentally imaged in the upper left hemithorax. Correlation with chest radiograph is recommended. 3. Mild cerebral atrophy with extensive chronic microvascular ischemic changes in the cerebral white matter. 4. Mild multilevel degenerative disc disease and cervical spondylosis, as above. Electronically Signed   By:  Trudie Reed M.D.   On: 03/13/2015 17:49   Ct Cervical Spine Wo Contrast  03/13/2015  CLINICAL DATA:  81 year old male with weakness since yesterday. History of fall yesterday. Prior history of stroke. EXAM: CT HEAD WITHOUT CONTRAST CT CERVICAL SPINE WITHOUT CONTRAST TECHNIQUE: Multidetector CT imaging of the head and cervical spine was performed following the standard protocol without intravenous contrast. Multiplanar CT image reconstructions of the cervical spine were also generated. COMPARISON:  Head CT 07/10/2014. FINDINGS: CT HEAD FINDINGS Mild cerebral atrophy. Patchy and confluent areas of decreased attenuation are noted throughout the deep and periventricular white matter of the cerebral hemispheres bilaterally, compatible with chronic microvascular ischemic disease. Fatty infiltration along the anterior aspect of the falx cerebri again noted, without a well-defined lipoma. No acute displaced skull fractures are identified. No acute intracranial abnormality. Specifically, no evidence of acute post-traumatic intracranial hemorrhage, no definite regions of acute/subacute cerebral ischemia, no focal mass, mass effect, hydrocephalus or abnormal intra or extra-axial fluid collections. The visualized paranasal sinuses and mastoids are well pneumatized. CT CERVICAL SPINE FINDINGS No acute displaced fracture of the cervical spine. Straightening of normal cervical lordosis. Alignment is otherwise anatomic. Prevertebral soft tissues are normal. Multilevel degenerative disc disease, most severe at C6-C7. Multilevel facet arthropathy. Visualized portions of the upper thorax demonstrates a left pleural effusion. IMPRESSION: 1. No evidence of significant acute traumatic injury to the skull, brain or cervical spine. 2. Left pleural effusion incidentally imaged in the upper left hemithorax. Correlation with chest radiograph is recommended. 3. Mild cerebral atrophy with extensive chronic microvascular ischemic changes  in the cerebral white matter. 4. Mild multilevel degenerative disc disease and cervical spondylosis, as above. Electronically Signed   By: Trudie Reed M.D.   On: 03/13/2015 17:49   Dg Chest Port 1 View  04/06/2015  CLINICAL DATA:  Weakness following colonoscopy 3 days prior. History of atrial fibrillation. EXAM: PORTABLE CHEST 1 VIEW COMPARISON:  March 18, 2015 FINDINGS: There is persistent left base consolidation with small left effusion. Lungs elsewhere clear. Heart is enlarged with pulmonary vascularity within normal limits. There is no appreciable adenopathy. There is atherosclerotic calcification in the aortic arch. Patient is status post coronary artery bypass grafting. IMPRESSION: Persistent consolidation left base with small left effusion. Suspect pneumonia left base. Right lung clear. Stable cardiomegaly. Followup PA and lateral chest radiographs recommended in 3-4 weeks following trial of antibiotic therapy to ensure resolution and exclude underlying malignancy. Electronically Signed   By: Bretta Bang III M.D.   On: 04/06/2015 10:58   Dg Chest Port 1 View  03/18/2015  CLINICAL DATA:  Shortness of breath with history of kidney failure. EXAM: PORTABLE CHEST 1 VIEW COMPARISON:  03/13/2015 FINDINGS: Sternotomy wires are intact. Lordotic technique is demonstrated as the lungs are adequately inflated with minimal linear density of the left base likely atelectasis and possible small amount of left pleural fluid. Stable cardiomegaly. Remainder of the exam is unchanged. IMPRESSION: Stable left base opacification likely small effusion with atelectasis. Mild stable cardiomegaly. Electronically Signed   By: Elberta Fortis M.D.   On: 03/18/2015 15:21   Dg Abd 2 Views  03/21/2015  CLINICAL DATA:  Followup adynamic ileus. Patient with a history  of C-difficile. EXAM: ABDOMEN - 2 VIEW COMPARISON:  03/19/2015 FINDINGS: There is increased gas throughout the bowel including small bowel and colon, but  no significant bowel dilation. No significant air-fluid levels. No free air. Calcifications are noted along the aorta and iliac and femoral arteries. Soft tissues otherwise unremarkable. Bones are demineralized degenerative changes of the lumbar spine. IMPRESSION: 1. Increased bowel gas diffusely, with no significant bowel dilation or air-fluid levels on the erect view. Findings may still reflect a mild adynamic ileus. There is no evidence of obstruction no free air. Electronically Signed   By: Amie Portlandavid  Ormond M.D.   On: 03/21/2015 13:00   Dg Abd 2 Views  03/19/2015  CLINICAL DATA:  81 year old male with abdominal pain, distention, and diarrhea after fall. Initial encounter. EXAM: ABDOMEN - 2 VIEW COMPARISON:  Portable chest radiograph 01/16/2015. FINDINGS: Upright and supine views of the abdomen and pelvis. No pneumoperitoneum. Curvilinear opacity at the left lung base most resembles atelectasis. Stable visualized mediastinal contours. Previous CABG. Redundant colon with widespread large bowel gaseous distension. Retained stool in the right abdomen. Gas-filled but nondilated small bowel loops throughout the visible abdomen. The large bowel gas continues into the pelvis. Cholecystectomy clips. Left inguinal surgical clips. Pelvic phleboliths. No acute osseous abnormality identified. IMPRESSION: 1. Gas throughout small and redundant large bowel loops most suggestive of diffuse ileus. 2. No abdominal free air. 3. Left lower lobe atelectasis. Electronically Signed   By: Odessa FlemingH  Hall M.D.   On: 03/19/2015 08:36   Dg Hip Unilat With Pelvis 2-3 Views Left  03/15/2015  CLINICAL DATA:  Patient fell from standing 3 days ago onto his left side. Complaining of left hip pain. Unable to rotate leg. EXAM: DG HIP (WITH OR WITHOUT PELVIS) 2-3V LEFT COMPARISON:  None. FINDINGS: No fracture or bone lesion. Hip joints, SI joints and symphysis pubis are normally aligned. No significant hip joint arthropathic change. Bones are  demineralized. There are iliac and femoral vascular calcifications. IMPRESSION: No fracture or dislocation. Electronically Signed   By: Amie Portlandavid  Ormond M.D.   On: 03/15/2015 12:56     Assessment and Recommendation  81 y.o. male with paroxysmal nonvalvular atrial fibrillation with controlled ventricular rate at this time and cannot assess whether in normal rhythm at this time on amiodarone with the significant lower extremity edema and ascites most consistent with liver failure rather than congestive heart failure with no current evidence of pulmonary edema 1. Continue Lasix only as necessary although currently the patient has normal fluid balances from the cardiac standpoint 2. Possible discontinuation of amiodarone due to liver failure and concerns of further side effects 3. No further cardiac diagnostics necessary at this time due to stability listed above 4. Further supportive care of the lower extremity edema and ascites  Signed, Arnoldo HookerBruce Deysi Soldo M.D. FACC

## 2015-04-08 NOTE — Plan of Care (Signed)
Pt improved from yesterday.  More alert - likely from hydration.  PO intake improved.  Number of stools decreased.  Fluids are continued at reduced rate but pt is beginning to sound "wet".  Has UTI.  Consult to Infectious disease dr.  Ammonia level WNL but he continues on lactulose.  Lasix d/ced. Dr. Mechele CollinElliott had difficult conversation w/family that pt is dying of multiorgan failure.  He recommended no resuscitation and mentioned hospice.  Family is discussing - but would like to take pt home w/ample home assistance.  Suggested to night nurse that we get Palliative Consult.  Family feels they may need help in breaking news to pt.

## 2015-04-08 NOTE — Care Management Important Message (Signed)
Important Message  Patient Details  Name: Blake White MRN: [id number] Date of Birth: 06/28/1933   Medicare Important Message Given:  Yes    Gwenette GreetBrenda S Quincey Nored, RN 04/08/2015, 11:25 AM

## 2015-04-09 ENCOUNTER — Inpatient Hospital Stay: Payer: Medicare Other

## 2015-04-09 LAB — BASIC METABOLIC PANEL
Anion gap: 7 (ref 5–15)
BUN: 49 mg/dL — ABNORMAL HIGH (ref 6–20)
CALCIUM: 9 mg/dL (ref 8.9–10.3)
CO2: 20 mmol/L — ABNORMAL LOW (ref 22–32)
CREATININE: 2.31 mg/dL — AB (ref 0.61–1.24)
Chloride: 108 mmol/L (ref 101–111)
GFR, EST AFRICAN AMERICAN: 29 mL/min — AB (ref >60)
GFR, EST NON AFRICAN AMERICAN: 25 mL/min — AB (ref >60)
Glucose, Bld: 104 mg/dL — ABNORMAL HIGH (ref 65–99)
Potassium: 3.2 mmol/L — ABNORMAL LOW (ref 3.5–5.1)
SODIUM: 135 mmol/L (ref 135–145)

## 2015-04-09 LAB — CBC
HEMATOCRIT: 35.3 % — AB (ref 40.0–52.0)
HEMOGLOBIN: 11.6 g/dL — AB (ref 13.0–18.0)
MCH: 34 pg (ref 26.0–34.0)
MCHC: 33 g/dL (ref 32.0–36.0)
MCV: 103 fL — ABNORMAL HIGH (ref 80.0–100.0)
Platelets: 151 10*3/uL (ref 150–440)
RBC: 3.42 MIL/uL — AB (ref 4.40–5.90)
RDW: 16.7 % — ABNORMAL HIGH (ref 11.5–14.5)
WBC: 7 10*3/uL (ref 3.8–10.6)

## 2015-04-09 LAB — MAGNESIUM: MAGNESIUM: 1.7 mg/dL (ref 1.7–2.4)

## 2015-04-09 LAB — PROTIME-INR
INR: 2.8
Prothrombin Time: 29.1 seconds — ABNORMAL HIGH (ref 11.4–15.0)

## 2015-04-09 MED ORDER — TAMSULOSIN HCL 0.4 MG PO CAPS
0.4000 mg | ORAL_CAPSULE | Freq: Every day | ORAL | Status: DC
  Administered 2015-04-09: 0.4 mg via ORAL
  Filled 2015-04-09: qty 1

## 2015-04-09 MED ORDER — DEXTROSE 5 % IV SOLN
1.0000 g | INTRAVENOUS | Status: DC
  Administered 2015-04-09: 1 g via INTRAVENOUS
  Filled 2015-04-09: qty 10

## 2015-04-09 MED ORDER — POTASSIUM CHLORIDE CRYS ER 20 MEQ PO TBCR
20.0000 meq | EXTENDED_RELEASE_TABLET | Freq: Two times a day (BID) | ORAL | Status: DC
  Administered 2015-04-09 – 2015-04-13 (×9): 20 meq via ORAL
  Filled 2015-04-09 (×9): qty 1

## 2015-04-09 MED ORDER — MAGNESIUM SULFATE 2 GM/50ML IV SOLN
2.0000 g | Freq: Once | INTRAVENOUS | Status: DC
  Filled 2015-04-09: qty 50

## 2015-04-09 MED ORDER — WARFARIN SODIUM 1 MG PO TABS: 1.0000 mg | ORAL_TABLET | Freq: Every day | ORAL | Status: DC

## 2015-04-09 MED ORDER — MAGNESIUM SULFATE 2 GM/50ML IV SOLN
2.0000 g | Freq: Once | INTRAVENOUS | Status: AC
  Administered 2015-04-09: 14:00:00 2 g via INTRAVENOUS
  Filled 2015-04-09: qty 50

## 2015-04-09 MED ORDER — WARFARIN - PHARMACIST DOSING INPATIENT
Freq: Every day | Status: DC
  Administered 2015-04-09 – 2015-04-12 (×4)

## 2015-04-09 MED ORDER — WARFARIN SODIUM 1 MG PO TABS
1.5000 mg | ORAL_TABLET | Freq: Every day | ORAL | Status: DC
  Administered 2015-04-09 – 2015-04-11 (×3): 1.5 mg via ORAL
  Filled 2015-04-09 (×3): qty 2

## 2015-04-09 NOTE — Progress Notes (Addendum)
Palliative Care Update   I have met with family of pt and also have talked with pt.    Family does not want him going to a facility again --unless its Hospice Home.   BUT they really want him to go back home no sooner than Monday or Tuesday with Hospice in the Home setting. They live in [place], [place] They would need to be presented with CHOICE by Care Mgr on Monday morning before their choice of Hospice could be consulted.   They are actually right in the middle of laying down hardwood laminate flooring today and this weekend. Then, they would want a hospital bed moved in on Monday before he is sent home.    The idea is to see if he can improve or stabilize over the weekend and then have Hospice in the Home early next week. This certainly seems reasonable.  The entire family is familiar with Hospice and Hospice Home also --from other experiences with different family members.   His wife asked if he could get therapy over the weekend to see if he could get up in a chair or stand because pt has indicated he really wants to do this and get out of bed.  I stated that on weekends, therapy is not a definite, but I will try to see if this can be done.   Also pt and family want the foley to stay in place on Monday.  Which means no Flomax is needed until then.  Family and pt understand about removing the foley to reduce the risk of colonization, but they feel his comfort is more important at this time.    We have agreed that I will meet with family Monday morning and we will see where things stand at that time.  Pt is not to be total comfort care until then, I suppose, but I will review orders and make sure he has some comfort / symptom medication options as prns over the weekend.    Additionally, he will be full code per his expressed wish not to be put on machines if/when  he dies.   Full note to follow.    Margaret F Campbell, MD 

## 2015-04-09 NOTE — Plan of Care (Signed)
Problem: Safety: Goal: Ability to remain free from injury will improve Outcome: Progressing Patient remains on bedrest.  Patient did not attempt to get out of bed this shift.  Bed in lowest position and bed alarm on throughout shift.  Call bell within reach.    Problem: Skin Integrity: Goal: Risk for impaired skin integrity will decrease Outcome: Progressing Patient with +2-+3 pitting edema.  Leg and arm bandages changed this shift due to on-going weeping.  Foam on sacrum changed due to watery diarrhea.  Patient turned every two hours and heels floated.  Patient with several watery/diarrhea stools throughout shift.  Patient is incontinent.  Foley care provided.

## 2015-04-09 NOTE — Evaluation (Signed)
Physical Therapy Evaluation Patient Details Name: NEITHAN DAY MRN: [id number] DOB: 03-18-1934 Today's Date: 04/09/2015   History of Present Illness  Madox Corkins is a 81 y.o. 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 Pt presents from [facility] where he was staying after last admission. Patient had fecal transplant done by Dr. Mechele Collin in one week back. Seems to have done well after this. But over the last 3 days has been extremely weak and confused. Initially family requested patient be transferred to [facility] as they were not happy with his recent hospital stay. Emergency room physician discussed with [facility] and transfer was refused as patient's care was possible at [facility]. Family is unhappy but have agreed for patient admission here the hospital. Initially patient was thought to have lower extremity cellulitis and vancomycin ordered by the ER physician. Wife at bedside mentions that he has always had stasis changes in his lower extremities and his legs look the same. Patient is afebrile and has normal white blood cell count. Urinalysis has shown some bacteria with 6-30 WBC. Ammonia level is 53.History has been obtained from old records, emergency room staff, family at bedside. Patient is fully oriented at time of PT evaluation. Reports 2 falls over the last 12 months.   Clinical Impression  Pt demonstrates generalized weakness and deconditioning from prolonged illness and bed rest. He does better than anticipated from clinical picture in medical record. Pt is able to transfer with minA/CGA and ambulates to recliner with CGA only. Pt is somewhat limited due to bilateral LE swelling with bleeding that starts to seep through [employer] in dependent position. Pt left in recliner with bilateral LEs elevated.  Family considering taking patient home. Pt is appropriate to return to SNF pending family agreement. If they choose to go home please arrange [facility] PT. Pt will benefit from skilled PT services to address deficits in strength, balance, and mobility in order to return to full function at home.     Follow Up Recommendations SNF (Family trying to decide. May take pt home with [facility] PT)    Equipment Recommendations  None recommended by PT    Recommendations for Other Services       Precautions / Restrictions Precautions Precautions: Fall Precaution Comments: enteric isolation Restrictions Weight Bearing Restrictions: No      Mobility  Bed Mobility Overal bed mobility: Needs Assistance Bed Mobility: Supine to Sit     Supine to sit: Min assist     General bed mobility comments: Pt demonstrates good rolling onto R side with supervision only. Requires assist from R sidelying to sitting as well as to scoot toward EOB. Decreased UE strength noted  Transfers Overall transfer level: Needs assistance Equipment used: Rolling walker (2 wheeled) Transfers: Sit to/from Stand Sit to Stand: Min assist         General transfer comment: Pt provided cues for hand placement. Fair LE strength but weak during sit to stand transfer. Assist for anterior weight shifting provided. Pt with fair standing balance with ability to remove hands from walker without LOB. Once upright at EOB pt starts to bleed through both Unna boots. RN notified and pressure applied until bleeding stopped.   Ambulation/Gait Ambulation/Gait assistance: Min guard Ambulation Distance (Feet): 3 Feet Assistive device: Rolling walker (2 wheeled) Gait Pattern/deviations: Decreased step length - right;Decreased  step length - left Gait velocity: Decreased   General Gait Details: Short, shuffling steps but steady on feet with use of rolling walker. Cues for turning to recliner  Stairs            Wheelchair Mobility    Modified  Rankin (Stroke Patients Only)       Balance Overall balance assessment: Needs assistance   Sitting balance-Leahy Scale: Good       Standing balance-Leahy Scale: Fair                               Pertinent Vitals/Pain Pain Assessment: No/denies pain    Home Living Family/patient expects to be discharged to:: Unsure Living Arrangements: Spouse/significant other;Children Available Help at Discharge: Family Type of Home: House Home Access: Stairs to enter Entrance Stairs-Rails: Left Entrance Stairs-Number of Steps: 2 Home Layout: One level Home Equipment: Walker - 2 wheels;Cane - single point;Shower seat;Bedside commode      Prior Function Level of Independence: Independent with assistive device(s) (Immediately prior was at [facility])         Comments: Pt was previously using a rolling walker to ambulate at home. Since last admission has been at [facility]. Reports ambulation with rolling walker at SNF     Hand Dominance        Extremity/Trunk Assessment   Upper Extremity Assessment: Generalized weakness           Lower Extremity Assessment: Generalized weakness         Communication   Communication: No difficulties  Cognition Arousal/Alertness: Awake/alert Behavior During Therapy: WFL for tasks assessed/performed Overall Cognitive Status: Within Functional Limits for tasks assessed (Requires some assist for year)                      General Comments General comments (skin integrity, edema, etc.): B Unna boots in place    Exercises General Exercises - Lower Extremity Long Arc Quad: Strengthening;Both;15 reps;Seated Hip ABduction/ADduction: Strengthening;Both;15 reps;Seated Hip Flexion/Marching: Strengthening;Both;15 reps;Seated      Assessment/Plan    PT Assessment Patient needs continued PT services  PT Diagnosis Difficulty walking;Generalized weakness;Abnormality of gait   PT Problem List Decreased  strength;Decreased activity tolerance;Decreased balance;Decreased mobility;Pain;Obesity  PT Treatment Interventions DME instruction;Gait training;Stair training;Functional mobility training;Therapeutic activities;Therapeutic exercise;Balance training;Neuromuscular re-education;Patient/family education;Manual techniques   PT Goals (Current goals can be found in the Care Plan section) Acute Rehab PT Goals Patient Stated Goal: to get stronger PT Goal Formulation: With patient/family Time For Goal Achievement: 04/23/15 Potential to Achieve Goals: Fair    Frequency Min 2X/week   Barriers to discharge        Co-evaluation               End of Session Equipment Utilized During Treatment: Gait belt Activity Tolerance: Patient tolerated treatment well Patient left: in chair;with call bell/phone within reach;with chair alarm set;with family/visitor present Nurse Communication: Mobility status         Time: 1100-1135 PT Time Calculation (min) (ACUTE ONLY): 35 min   Charges:   PT Evaluation $Initial PT Evaluation Tier I: 1 Procedure PT Treatments $Therapeutic Exercise: 8-22 mins   PT G Codes:       Sharalyn InkJason D Mareo Portilla PT, DPT   Ahonesty Woodfin 04/09/2015, 12:01 PM

## 2015-04-09 NOTE — Clinical Social Work Note (Signed)
CSW consulted for pt as he was admitted from [facility]. Pt's family would like a Palliative Care consult, which pending. Full assessment to follow. CSW will continue to follow.   Dede QuerySarah Kadijah Shamoon, MSW, LCSW Clinical Social Worker  [phone]

## 2015-04-09 NOTE — Care Management (Signed)
Spoke with son, Jillyn HiddenGary [phone]) , wife, and other family members. Would like to talk to Palliative Care representative to establish goals of care. Ordered per Dr. Elisabeth PigeonVachhani. Gwenette GreetBrenda S Vail Vuncannon RN MSN [facility] Care Management [phone]

## 2015-04-09 NOTE — Consult Note (Signed)
Pt with CPAP on.  Was awake and responded to questions.  On lactulose bid.  Mg level a little below normal, will leave to hospitalist to order.  Chest clear anterior fields, abd distended with bowel sounds and some tinkling suggesting ileus.  Legs wrapped.  Has UTI and ID consulted.  No new suggestions at this time.  With stool neg now for C. Diff after vancomycin followed by stool transplant recently I wonder if he needs isolation or not.  Will discuss with Jason FilaSara Wall.

## 2015-04-09 NOTE — Consult Note (Signed)
[facility] Infectious Disease     White for Consult: UTI, C diff    Referring Physician: Boyce Medici Date of Admission:  04/06/2015   Active Problems:   Hepatic encephalopathy ([place])   HPI: Blake White is a 81 y.o. male with cirrhosis, CHF and recurrent C diff s/p fecal transplant admitted 11/29 with weakness.  On admission had nml wbc, UA with 6-30 wbc and ucx has been + for Klebsiella. HAs foely in place CXR showed persistent infiltrate at L base. He had C diff testing which has been negative. He was just started on antibiotic on 1/2 with ceftraixone.  There was also some concern for cellulitis but it is felt LE changes are chronic venous stasis dermatitis.  Currently he has no fevers, chills, back pain.  We are being consulted to guide abx therapy for the possible UTI.  Past Medical History  Diagnosis Date  . CHF (congestive heart failure) ([place])   . BPH (benign prostatic hyperplasia)   . A-fib ([place])   . Thyroid disease   . High cholesterol   . Bilateral lower extremity edema   . CAD (coronary artery disease)   . Cirrhosis ([place])   . CVA (cerebral vascular accident) ([place])   . Gout   . Hypothyroidism   . Myocardial infarction ([place])   . Obesity   . Dysrhythmia   . History of chickenpox   . ED (erectile dysfunction)    Past Surgical History  Procedure Laterality Date  . Vein bypass surgery    . Vascular surgery    . Cardiac surgery      quad bypass  . Esophagogastroduodenoscopy (egd) with propofol [name] 03/04/2015    Procedure: ESOPHAGOGASTRODUODENOSCOPY (EGD) WITH PROPOFOL;  Surgeon: Josefine Class, MD;  Location: [facility] ENDOSCOPY;  Service: Endoscopy;  Laterality: [name];  . Coronary artery bypass graft    . Fecal transplant [name] 03/29/2015    Procedure: FECAL TRANSPLANT;  Surgeon: Manya Silvas, MD;  Location: [facility] ENDOSCOPY;  Service: Endoscopy;  Laterality: [name];   Social History  Substance Use Topics  . Smoking status: Former Smoker    Types: Cigarettes     Quit date: 03/26/1979  . Smokeless tobacco: Never Used  . Alcohol Use: No   Family History  Problem Relation Age of Onset  . Diabetes Mellitus II Mother   . Lung cancer Brother     Allergies: No Known Allergies  Current antibiotics: Antibiotics Given (last 72 hours)    Date/Time Action Medication Dose Rate   04/09/15 0844 Given   cefTRIAXone (ROCEPHIN) 1 g in dextrose 5 % 50 mL IVPB 1 g 100 mL/hr      MEDICATIONS: . allopurinol  150 mg Oral Daily  . amiodarone  200 mg Oral Daily  . antiseptic oral rinse  7 mL Mouth Rinse q12n4p  . aspirin EC  81 mg Oral Daily  . cefTRIAXone (ROCEPHIN)  IV  1 g Intravenous Q24H  . famotidine  20 mg Oral Daily  . lactulose  30 g Oral BID  . levothyroxine  150 mcg Oral QAC breakfast  . midodrine  5 mg Oral TID WC  . saccharomyces boulardii  250 mg Oral BID  . warfarin  1.5 mg Oral q1800  . Warfarin - Pharmacist Dosing Inpatient   Does not apply q1800   Review of Systems - unable to obtain due to confusion   OBJECTIVE: Temp:  [97.7 F (36.5 C)-99.5 F (37.5 C)] 97.7 F (36.5 C) (12/02 0809) Pulse Rate:  [  60-73] 73 (12/02 0809) Resp:  [17-20] 17 (12/02 0809) BP: (96-109)/(48-53) 96/53 mmHg (12/02 0809) SpO2:  [96 %-100 %] 96 % (12/02 0809) Weight:  [94.303 kg (207 lb 14.4 oz)] 94.303 kg (207 lb 14.4 oz) (12/02 0500)  GENERAL: 81 y.o.-year-old patient lying in the bed with no acute distress.  EYES: Pupils equal, round, reactive to light and accommodation. No scleral icterus.  HEENT: Head atraumatic, normocephalic. Oropharynx and nasopharynx clear. Dry MM NECK: Supple, no jugular venous distention. No thyroid enlargement, no tenderness.  LUNGS: Normal breath sounds bilaterally, CARDIOVASCULAR: S1, S2 normal. distant ABDOMEN: Distended, nontender, distended. Bowel sounds present. No organomegaly or mass.  EXTREMITIES: LE wrapped bil NEUROLOGIC: drowsy, minimal arousal. PSYCHIATRIC: The patient is drowsy.  Foley cath in place  with clear urine  LABS: Results for orders placed or performed during the hospital encounter of 04/06/15 (from the past 48 hour(s))  Protime-INR     Status: Abnormal   Collection Time: 04/08/15  4:24 AM  Result Value Ref Range   Prothrombin Time 31.7 (H) 11.4 - 15.0 seconds   INR 3.14   Comprehensive metabolic panel     Status: Abnormal   Collection Time: 04/08/15  4:24 AM  Result Value Ref Range   Sodium 135 135 - 145 mmol/L   Potassium 3.5 3.5 - 5.1 mmol/L   Chloride 107 101 - 111 mmol/L   CO2 23 22 - 32 mmol/L   Glucose, Bld 95 65 - 99 mg/dL   BUN 49 (H) 6 - 20 mg/dL   Creatinine, Ser 2.27 (H) 0.61 - 1.24 mg/dL   Calcium 8.9 8.9 - 10.3 mg/dL   Total Protein 5.2 (L) 6.5 - 8.1 g/dL   Albumin 2.2 (L) 3.5 - 5.0 g/dL   AST 55 (H) 15 - 41 U/L   ALT 43 17 - 63 U/L   Alkaline Phosphatase 89 38 - 126 U/L   Total Bilirubin 1.6 (H) 0.3 - 1.2 mg/dL   GFR calc non Af Amer 26 (L) >60 mL/min   GFR calc Af Amer 30 (L) >60 mL/min    Comment: (NOTE) The eGFR has been calculated using the CKD EPI equation. This calculation has not been validated in all clinical situations. eGFR's persistently <60 mL/min signify possible Chronic Kidney Disease.    Anion gap 5 5 - 15  Protime-INR     Status: Abnormal   Collection Time: 04/09/15  4:48 AM  Result Value Ref Range   Prothrombin Time 29.1 (H) 11.4 - 15.0 seconds   INR 2.80   CBC     Status: Abnormal   Collection Time: 04/09/15  4:48 AM  Result Value Ref Range   WBC 7.0 3.8 - 10.6 K/uL   RBC 3.42 (L) 4.40 - 5.90 MIL/uL   Hemoglobin 11.6 (L) 13.0 - 18.0 g/dL   HCT 35.3 (L) 40.0 - 52.0 %   MCV 103.0 (H) 80.0 - 100.0 fL   MCH 34.0 26.0 - 34.0 pg   MCHC 33.0 32.0 - 36.0 g/dL   RDW 16.7 (H) 11.5 - 14.5 %   Platelets 151 150 - 440 K/uL  Basic metabolic panel     Status: Abnormal   Collection Time: 04/09/15  4:48 AM  Result Value Ref Range   Sodium 135 135 - 145 mmol/L   Potassium 3.2 (L) 3.5 - 5.1 mmol/L   Chloride 108 101 - 111 mmol/L    CO2 20 (L) 22 - 32 mmol/L   Glucose, Bld 104 (H) 65 - 99  mg/dL   BUN 49 (H) 6 - 20 mg/dL   Creatinine, Ser 2.31 (H) 0.61 - 1.24 mg/dL   Calcium 9.0 8.9 - 10.3 mg/dL   GFR calc non Af Amer 25 (L) >60 mL/min   GFR calc Af Amer 29 (L) >60 mL/min    Comment: (NOTE) The eGFR has been calculated using the CKD EPI equation. This calculation has not been validated in all clinical situations. eGFR's persistently <60 mL/min signify possible Chronic Kidney Disease.    Anion gap 7 5 - 15  Magnesium     Status: None   Collection Time: 04/09/15  4:48 AM  Result Value Ref Range   Magnesium 1.7 1.7 - 2.4 mg/dL   No components found for: ESR, C REACTIVE PROTEIN MICRO: Recent Results (from the past 720 hour(s))  C difficile quick scan w PCR reflex     Status: Abnormal   Collection Time: 03/13/15  4:58 PM  Result Value Ref Range Status   C Diff antigen POSITIVE (A) NEGATIVE Final   C Diff toxin POSITIVE (A) NEGATIVE Final   C Diff interpretation   Final    Positive for toxigenic C. difficile, active toxin production present.    Comment: CRITICAL RESULT CALLED TO, READ BACK BY AND VERIFIED WITH: [facility] MOFFITT RN AT 1610 03/13/2015   Urine culture     Status: None   Collection Time: 03/14/15  6:35 PM  Result Value Ref Range Status   Specimen Description URINE, RANDOM  Final   Special Requests NONE  Final   Culture NO GROWTH 1 DAY  Final   Report Status 03/16/2015 FINAL  Final  Stool culture     Status: None   Collection Time: 03/15/15  1:49 AM  Result Value Ref Range Status   Specimen Description STOOL  Final   Special Requests NONE  Final   Culture   Final    NO CAMPYLOBACTER DETECTED NO SALMONELLA OR SHIGELLA ISOLATED No Pathogenic E. coli detected    Report Status 03/18/2015 FINAL  Final  Urine culture     Status: None   Collection Time: 04/06/15 11:51 AM  Result Value Ref Range Status   Specimen Description URINE, CATHETERIZED  Final   Special Requests NONE  Final   Culture  >=100,000 COLONIES/mL KLEBSIELLA OXYTOCA  Final   Report Status 04/08/2015 FINAL  Final   Organism ID, Bacteria KLEBSIELLA OXYTOCA  Final      Susceptibility   Klebsiella oxytoca - MIC*    AMPICILLIN >=32 RESISTANT Resistant     CEFTAZIDIME <=1 SENSITIVE Sensitive     CEFAZOLIN >=64 RESISTANT Resistant     CEFTRIAXONE <=1 SENSITIVE Sensitive     CIPROFLOXACIN <=0.25 SENSITIVE Sensitive     GENTAMICIN <=1 SENSITIVE Sensitive     IMIPENEM <=0.25 SENSITIVE Sensitive     TRIMETH/SULFA <=20 SENSITIVE Sensitive     PIP/TAZO Value in next row Sensitive      SENSITIVE<=4    * >=100,000 COLONIES/mL KLEBSIELLA OXYTOCA  C difficile quick scan w PCR reflex     Status: None   Collection Time: 04/06/15  5:07 PM  Result Value Ref Range Status   C Diff antigen NEGATIVE NEGATIVE Final   C Diff toxin NEGATIVE NEGATIVE Final   C Diff interpretation Negative for C. difficile  Final  MRSA PCR Screening     Status: None   Collection Time: 04/06/15  8:27 PM  Result Value Ref Range Status   MRSA by PCR NEGATIVE NEGATIVE Final  Comment:        The GeneXpert MRSA Assay (FDA approved for NASAL specimens only), is one component of a comprehensive MRSA colonization surveillance program. It is not intended to diagnose MRSA infection nor to guide or monitor treatment for MRSA infections.     IMAGING: Dg Chest 2 View  03/13/2015  CLINICAL DATA:  Weakness since yesterday.  Extremity edema. EXAM: CHEST  2 VIEW COMPARISON:  02/19/2015 FINDINGS: There is mild bilateral interstitial thickening. There is a trace left pleural effusion. There is no focal parenchymal opacity. There is no pneumothorax. There is stable cardiomegaly. There is evidence of prior CABG. There is thoracic aortic atherosclerosis. The osseous structures are unremarkable. IMPRESSION: Findings most concerning for mild CHF. Electronically Signed   By: Kathreen Devoid   On: 03/13/2015 17:49   Ct Head Wo Contrast  04/06/2015  CLINICAL DATA:   Altered mental status EXAM: CT HEAD WITHOUT CONTRAST TECHNIQUE: Contiguous axial images were obtained from the base of the skull through the vertex without intravenous contrast. COMPARISON:  March 20, 2015 FINDINGS: Mild diffuse atrophy is stable. There is no intracranial mass, hemorrhage, extra-axial fluid collection, or midline shift. There is patchy small vessel disease throughout the centra semiovale bilaterally, stable. There is no new gray-white compartment lesion. No acute infarct evident. Bony calvarium appears intact. The mastoid air cells are clear. Visualized orbits appear symmetric and normal bilaterally. IMPRESSION: Stable atrophy with periventricular small vessel disease. No acute infarct evident. No hemorrhage or mass effect. Electronically Signed   By: Lowella Grip III M.D.   On: 04/06/2015 11:05   Ct Head Wo Contrast  03/20/2015  CLINICAL DATA:  Confusion. EXAM: CT HEAD WITHOUT CONTRAST TECHNIQUE: Contiguous axial images were obtained from the base of the skull through the vertex without intravenous contrast. COMPARISON:  03/13/2015 FINDINGS: Ventricles are normal in configuration. There is ventricular and sulcal enlargement reflecting mild to moderate atrophy. There are no parenchymal masses or mass effect. There are patchy areas of white matter hypoattenuation consistent with moderate chronic microvascular ischemic change. There is no evidence of a cortical infarct. There are no extra-axial masses or abnormal fluid collections. Small amount of fat along the anterior falx cerebri, stable. No intracranial hemorrhage. Visualized sinuses and mastoid air cells are clear. IMPRESSION: 1. No acute intracranial abnormalities. Stable appearance from the recent prior exam. Electronically Signed   By: Lajean Manes M.D.   On: 03/20/2015 13:13   Ct Head Wo Contrast  03/13/2015  CLINICAL DATA:  81 year old male with weakness since yesterday. History of fall yesterday. Prior history of stroke.  EXAM: CT HEAD WITHOUT CONTRAST CT CERVICAL SPINE WITHOUT CONTRAST TECHNIQUE: Multidetector CT imaging of the head and cervical spine was performed following the standard protocol without intravenous contrast. Multiplanar CT image reconstructions of the cervical spine were also generated. COMPARISON:  Head CT 07/10/2014. FINDINGS: CT HEAD FINDINGS Mild cerebral atrophy. Patchy and confluent areas of decreased attenuation are noted throughout the deep and periventricular white matter of the cerebral hemispheres bilaterally, compatible with chronic microvascular ischemic disease. Fatty infiltration along the anterior aspect of the falx cerebri again noted, without a well-defined lipoma. No acute displaced skull fractures are identified. No acute intracranial abnormality. Specifically, no evidence of acute post-traumatic intracranial hemorrhage, no definite regions of acute/subacute cerebral ischemia, no focal mass, mass effect, hydrocephalus or abnormal intra or extra-axial fluid collections. The visualized paranasal sinuses and mastoids are well pneumatized. CT CERVICAL SPINE FINDINGS No acute displaced fracture of the cervical spine. Straightening of  normal cervical lordosis. Alignment is otherwise anatomic. Prevertebral soft tissues are normal. Multilevel degenerative disc disease, most severe at C6-C7. Multilevel facet arthropathy. Visualized portions of the upper thorax demonstrates a left pleural effusion. IMPRESSION: 1. No evidence of significant acute traumatic injury to the skull, brain or cervical spine. 2. Left pleural effusion incidentally imaged in the upper left hemithorax. Correlation with chest radiograph is recommended. 3. Mild cerebral atrophy with extensive chronic microvascular ischemic changes in the cerebral white matter. 4. Mild multilevel degenerative disc disease and cervical spondylosis, as above. Electronically Signed   By: Vinnie Langton M.D.   On: 03/13/2015 17:49   Ct Cervical Spine Wo  Contrast  03/13/2015  CLINICAL DATA:  81 year old male with weakness since yesterday. History of fall yesterday. Prior history of stroke. EXAM: CT HEAD WITHOUT CONTRAST CT CERVICAL SPINE WITHOUT CONTRAST TECHNIQUE: Multidetector CT imaging of the head and cervical spine was performed following the standard protocol without intravenous contrast. Multiplanar CT image reconstructions of the cervical spine were also generated. COMPARISON:  Head CT 07/10/2014. FINDINGS: CT HEAD FINDINGS Mild cerebral atrophy. Patchy and confluent areas of decreased attenuation are noted throughout the deep and periventricular white matter of the cerebral hemispheres bilaterally, compatible with chronic microvascular ischemic disease. Fatty infiltration along the anterior aspect of the falx cerebri again noted, without a well-defined lipoma. No acute displaced skull fractures are identified. No acute intracranial abnormality. Specifically, no evidence of acute post-traumatic intracranial hemorrhage, no definite regions of acute/subacute cerebral ischemia, no focal mass, mass effect, hydrocephalus or abnormal intra or extra-axial fluid collections. The visualized paranasal sinuses and mastoids are well pneumatized. CT CERVICAL SPINE FINDINGS No acute displaced fracture of the cervical spine. Straightening of normal cervical lordosis. Alignment is otherwise anatomic. Prevertebral soft tissues are normal. Multilevel degenerative disc disease, most severe at C6-C7. Multilevel facet arthropathy. Visualized portions of the upper thorax demonstrates a left pleural effusion. IMPRESSION: 1. No evidence of significant acute traumatic injury to the skull, brain or cervical spine. 2. Left pleural effusion incidentally imaged in the upper left hemithorax. Correlation with chest radiograph is recommended. 3. Mild cerebral atrophy with extensive chronic microvascular ischemic changes in the cerebral white matter. 4. Mild multilevel degenerative disc  disease and cervical spondylosis, as above. Electronically Signed   By: Vinnie Langton M.D.   On: 03/13/2015 17:49   US Abdomen Limited  04/09/2015  CLINICAL DATA:  Abdominal distention EXAM: LIMITED ABDOMEN ULTRASOUND FOR ASCITES TECHNIQUE: Limited ultrasound survey for ascites was performed in all four abdominal quadrants. COMPARISON:  01/17/2015 FINDINGS: Small, borderline mild ascites, increased from comparison study. The largest pocket is 2 cm in depth, at the level of the liver. Volume of ascites would make clinical tap difficult. No evidence of loculation or internal complexity. Cirrhosis, better demonstrated on previous sonography. IMPRESSION: Small volume ascites but increased from 01/17/2015. Electronically Signed   By: Monte Fantasia M.D.   On: 04/09/2015 09:29   Dg Chest Port 1 View  04/06/2015  CLINICAL DATA:  Weakness following colonoscopy 3 days prior. History of atrial fibrillation. EXAM: PORTABLE CHEST 1 VIEW COMPARISON:  March 18, 2015 FINDINGS: There is persistent left base consolidation with small left effusion. Lungs elsewhere clear. Heart is enlarged with pulmonary vascularity within normal limits. There is no appreciable adenopathy. There is atherosclerotic calcification in the aortic arch. Patient is status post coronary artery bypass grafting. IMPRESSION: Persistent consolidation left base with small left effusion. Suspect pneumonia left base. Right lung clear. Stable cardiomegaly. Followup PA and lateral  chest radiographs recommended in 3-4 weeks following trial of antibiotic therapy to ensure resolution and exclude underlying malignancy. Electronically Signed   By: Lowella Grip III M.D.   On: 04/06/2015 10:58   Dg Chest Port 1 View  03/18/2015  CLINICAL DATA:  Shortness of breath with history of kidney failure. EXAM: PORTABLE CHEST 1 VIEW COMPARISON:  03/13/2015 FINDINGS: Sternotomy wires are intact. Lordotic technique is demonstrated as the lungs are adequately  inflated with minimal linear density of the left base likely atelectasis and possible small amount of left pleural fluid. Stable cardiomegaly. Remainder of the exam is unchanged. IMPRESSION: Stable left base opacification likely small effusion with atelectasis. Mild stable cardiomegaly. Electronically Signed   By: Marin Olp M.D.   On: 03/18/2015 15:21   Dg Abd 2 Views  03/21/2015  CLINICAL DATA:  Followup adynamic ileus. Patient with a history of C-difficile. EXAM: ABDOMEN - 2 VIEW COMPARISON:  03/19/2015 FINDINGS: There is increased gas throughout the bowel including small bowel and colon, but no significant bowel dilation. No significant air-fluid levels. No free air. Calcifications are noted along the aorta and iliac and femoral arteries. Soft tissues otherwise unremarkable. Bones are demineralized degenerative changes of the lumbar spine. IMPRESSION: 1. Increased bowel gas diffusely, with no significant bowel dilation or air-fluid levels on the erect view. Findings may still reflect a mild adynamic ileus. There is no evidence of obstruction no free air. Electronically Signed   By: Lajean Manes M.D.   On: 03/21/2015 13:00   Dg Abd 2 Views  03/19/2015  CLINICAL DATA:  81 year old male with abdominal pain, distention, and diarrhea after fall. Initial encounter. EXAM: ABDOMEN - 2 VIEW COMPARISON:  Portable chest radiograph 01/16/2015. FINDINGS: Upright and supine views of the abdomen and pelvis. No pneumoperitoneum. Curvilinear opacity at the left lung base most resembles atelectasis. Stable visualized mediastinal contours. Previous CABG. Redundant colon with widespread large bowel gaseous distension. Retained stool in the right abdomen. Gas-filled but nondilated small bowel loops throughout the visible abdomen. The large bowel gas continues into the pelvis. Cholecystectomy clips. Left inguinal surgical clips. Pelvic phleboliths. No acute osseous abnormality identified. IMPRESSION: 1. Gas throughout  small and redundant large bowel loops most suggestive of diffuse ileus. 2. No abdominal free air. 3. Left lower lobe atelectasis. Electronically Signed   By: Genevie Ann M.D.   On: 03/19/2015 08:36   Dg Hip Unilat With Pelvis 2-3 Views Left  03/15/2015  CLINICAL DATA:  Patient fell from standing 3 days ago onto his left side. Complaining of left hip pain. Unable to rotate leg. EXAM: DG HIP (WITH OR WITHOUT PELVIS) 2-3V LEFT COMPARISON:  None. FINDINGS: No fracture or bone lesion. Hip joints, SI joints and symphysis pubis are normally aligned. No significant hip joint arthropathic change. Bones are demineralized. There are iliac and femoral vascular calcifications. IMPRESSION: No fracture or dislocation. Electronically Signed   By: Lajean Manes M.D.   On: 03/15/2015 12:56    Assessment:   Blake White is a 81 y.o. male with cirrhosis, CHF, ckd and recurrent C diff s/p fecal transplant admitted 11/29 with weakness.  On admission had nml wbc, UA with 6-30 wbc and ucx has been + for Klebsiella. He has a foley in place for the last several weeks for what sounds like urinary retention.   CXR showed persistent infiltrate at L base. He had C diff testing which has been negative. He was just started on antibiotic on 12/2 (today)with ceftraixone.  There was also  some concern for cellulitis but it is felt LE changes are chronic venous stasis dermatitis.  Currently he remains confused and ill but wife reports that has not worsened since admit and has acutally improved some We are being consulted to guide abx therapy for the possible UTI.  Recommendations I am not convinced his Klebsiella culture represents true infection since only 6-30 wbc on admit UA, no leukocytosis and no fevers.  He has chronic foley in place so will always have bacteruria.  I discussed with wife and daughter that I do not recommend any antibiotics at this time to avoid a recurrence of C diff. Would suggest trying to dc foley but sounds like  it is related to urinary retention so may need flomax but he is on midodrine and flomax can cause hyoptension. I agree with Dr Tedra Coupe that he is progressive declining and would benefit from palliative care consult.  WIfe agrees.  Thank you very much for allowing me to participate in the care of this patient. Please call with questions.   Cheral Marker. Ola Spurr, MD

## 2015-04-09 NOTE — Progress Notes (Signed)
ANTICOAGULATION CONSULT NOTE - Follow Up Consult  Pharmacy Consult for wafarin Indication: atrial fibrillation  No Known Allergies  Patient Measurements: Height: 5\' 9"  (175.3 cm) Weight: 207 lb 14.4 oz (94.303 kg) IBW/kg (Calculated) : 70.7   Vital Signs: Temp: 97.7 F (36.5 C) (12/02 0809) Temp Source: Oral (12/02 0809) BP: 96/53 mmHg (12/02 0809) Pulse Rate: 73 (12/02 0809)  Labs:  Recent Labs  04/06/15 1151 04/07/15 0451 04/08/15 0424 04/09/15 0448  HGB 11.7* 11.4*  --  11.6*  HCT 35.7* 34.3*  --  35.3*  PLT 149* 130*  --  151  LABPROT  --  31.7* 31.7* 29.1*  INR  --  3.14 3.14 2.80  CREATININE 2.37* 2.22* 2.27* 2.31*  TROPONINI <0.03  --   --   --     Estimated Creatinine Clearance: 29.4 mL/min (by C-G formula based on Cr of 2.31).   Medical History: Past Medical History  Diagnosis Date  . CHF (congestive heart failure) ([facility])   . BPH (benign prostatic hyperplasia)   . A-fib ([facility])   . Thyroid disease   . High cholesterol   . Bilateral lower extremity edema   . CAD (coronary artery disease)   . Cirrhosis ([facility])   . CVA (cerebral vascular accident) ([facility])   . Gout   . Hypothyroidism   . Myocardial infarction ([facility])   . Obesity   . Dysrhythmia   . History of chickenpox   . ED (erectile dysfunction)     Medications:  Scheduled:  . allopurinol  150 mg Oral Daily  . amiodarone  200 mg Oral Daily  . antiseptic oral rinse  7 mL Mouth Rinse q12n4p  . aspirin EC  81 mg Oral Daily  . cefTRIAXone (ROCEPHIN)  IV  1 g Intravenous Q24H  . famotidine  20 mg Oral Daily  . lactulose  30 g Oral BID  . levothyroxine  150 mcg Oral QAC breakfast  . magnesium sulfate 1 - 4 g bolus IVPB  2 g Intravenous Once  . midodrine  5 mg Oral TID WC  . saccharomyces boulardii  250 mg Oral BID  . warfarin  1.5 mg Oral q1800  . Warfarin - Pharmacist Dosing Inpatient   Does not apply q1800    Assessment: Pharmacy consulted to dose wafarin in a 81 yo male with atrial  fibrillation. PTA wafarin dosing of 2 mg po daily.  INR History: 11/30- INR: 3.14, warfarin held 12/1 - INR: 3.14, warfarin held 12/2 - INR: 2.8  Goal of Therapy:  INR 2-3 Monitor platelets by anticoagulation protocol: Yes   Plan:  INR within therapeutic range.  Will restart at lower dose (~20% decrease) of warfarin 1.5 mg po daily.  Will recheck INR in AM.   Pharmacy will continue to follow.   Nancylee Gaines G 04/09/2015,8:53 AM

## 2015-04-09 NOTE — Progress Notes (Signed)
[facility] - [place] at [facility]   PATIENT NAME: Blake MangesBobby White    MR#:  [id number]  DATE OF BIRTH:  02/20/1934  SUBJECTIVE:  CHIEF COMPLAINT:   Chief Complaint  Patient presents with  . Weakness   pt is very alert and oriented today, denies any complains- said he had BM today. feels very weak, and asking to have PT.   REVIEW OF SYSTEMS:     Review of Systems  Constitutional: Negative for fever, chills and weight loss.  HENT: Negative for ear pain and tinnitus.   Eyes: Negative for blurred vision, double vision and discharge.  Respiratory: Negative for cough, shortness of breath and wheezing.   Cardiovascular: Negative for chest pain, palpitations and leg swelling.  Gastrointestinal: Negative for heartburn, nausea, vomiting and abdominal pain.  Genitourinary: Negative for dysuria, urgency and frequency.  Musculoskeletal: Negative for myalgias.  Skin: Negative for itching and rash.  Neurological: Positive for weakness. Negative for dizziness, tingling, tremors, focal weakness and headaches.    DRUG ALLERGIES:  No Known Allergies  VITALS:  Blood pressure 105/49, pulse 61, temperature 97.5 F (36.4 C), temperature source Oral, resp. rate 17, height 5\' 9"  (1.753 m), weight 94.303 kg (207 lb 14.4 oz), SpO2 100 %.  PHYSICAL EXAMINATION:  GENERAL:  81 y.o.-year-old patient lying in the bed with no acute distress.  EYES: Pupils equal, round, reactive to light and accommodation. No scleral icterus.  HEENT: Head atraumatic, normocephalic. Oropharynx and nasopharynx clear.  NECK:  Supple, no jugular venous distention. No thyroid enlargement, no tenderness.  LUNGS: Normal breath sounds bilaterally, no wheezing, some crepitation. No use of accessory muscles of respiration.  CARDIOVASCULAR: S1, S2 normal. No murmurs, rubs, or gallops.  ABDOMEN: Soft, nontender, distended. Bowel sounds present. No organomegaly or mass.  EXTREMITIES: some pedal edema, cyanosis,  or clubbing. Some skin breakdowns with oozing fluids. Elastic wraps on both legs. NEUROLOGIC: moves all limbs, follows commands, power 4/5. PSYCHIATRIC: The patient is alert and oriented.  SKIN: No obvious rash, lesion, or ulcer.   Physical Exam LABORATORY PANEL:   CBC  Recent Labs Lab 04/09/15 0448  WBC 7.0  HGB 11.6*  HCT 35.3*  PLT 151   ------------------------------------------------------------------------------------------------------------------  Chemistries   Recent Labs Lab 04/08/15 0424 04/09/15 0448  NA 135 135  K 3.5 3.2*  CL 107 108  CO2 23 20*  GLUCOSE 95 104*  BUN 49* 49*  CREATININE 2.27* 2.31*  CALCIUM 8.9 9.0  MG  --  1.7  AST 55*  --   ALT 43  --   ALKPHOS 89  --   BILITOT 1.6*  --    ------------------------------------------------------------------------------------------------------------------  Cardiac Enzymes  Recent Labs Lab 04/06/15 1151  TROPONINI <0.03   ------------------------------------------------------------------------------------------------------------------  RADIOLOGY:  [place] Abdomen Limited  04/09/2015  CLINICAL DATA:  Abdominal distention EXAM: LIMITED ABDOMEN ULTRASOUND FOR ASCITES TECHNIQUE: Limited ultrasound survey for ascites was performed in all four abdominal quadrants. COMPARISON:  01/17/2015 FINDINGS: Small, borderline mild ascites, increased from comparison study. The largest pocket is 2 cm in depth, at the level of the liver. Volume of ascites would make clinical tap difficult. No evidence of loculation or internal complexity. Cirrhosis, better demonstrated on previous sonography. IMPRESSION: Small volume ascites but increased from 01/17/2015. Electronically Signed   By: Marnee SpringJonathon  Watts M.D.   On: 04/09/2015 09:29    ASSESSMENT AND PLAN:   Active Problems:   Hepatic encephalopathy (HCC)  * Hepatic encephalopathy and altered mental status due to infection Started  on lactulose and rifaximin. lower Repeat  ammonia level. Appreciated Consult GI.  His urine culture reported positive with resistant Klebsiella.  Maybe this was also playing a role in his altered mental status.   More alert today.  * Lower extremity stasis changes Likely swelling is contributed from liver failure.  Continue elastic wraps.   Less likely to have cellulitis as it is same as in past.  * UTI  Reviewed urine culture and it is resistant Klebsiella.  Called ID consult to help in guiding therapy.   Started rocephin per culture report.  * CKD stage IV is stable  * Atrial fibrillation No tachycardia. Continue medications along with Coumadin.   Pharmacy to help manage coumadin dose.  * ac systolic CHF   Low EF per past Echo.   IV lasix-appreciated cardio consult.  * DVT prophylaxis On Coumadin  * abdominal distention   US abdomen- shows small ascites.    All the records are reviewed and case discussed with Care Management/Social Workerr. Management plans discussed with the patient, family and they are in agreement.  CODE STATUS: full  TOTAL TIME TAKING CARE OF THIS PATIENT: 30 minutes.    Explained them about findings and possible slow or incomplete recovery due to multiple complicating issues. Answered their all questions. They understands poor outcome- and want to have hospice and palliative care involved in his care, called it today.  POSSIBLE D/C IN 3-4 DAYS, DEPENDING ON CLINICAL CONDITION.   Altamese Dilling M.D on 04/09/2015   Between 7am to 6pm - Pager - [phone]  After 6pm go to [email or web address] - password [email or web address]  [facility]  Office  [phone]  CC: Primary care physician; Sula Rumple, MD  Note: This dictation was prepared with Dragon dictation along with smaller phrase technology. Any transcriptional errors that result from this process are unintentional.

## 2015-04-09 NOTE — Plan of Care (Signed)
Problem: Education: Goal: Knowledge of [place] [facility] Education information/materials will improve Outcome: Progressing Educated pt about high fall risk precautions. Pt verbalized understanding. Plan of care reviewed with pt.  Problem: Safety: Goal: Ability to remain free from injury will improve Outcome: Progressing High fall risk precautions maintained. Pt remained free of injury during the shift.  Problem: Health Behavior/Discharge Planning: Goal: Ability to manage health-related needs will improve Outcome: Progressing Pallative consult ordered. Pt may go home with hospice at discharge per Dr Orvan Falconerampbell. Foley continues per order.  Problem: Skin Integrity: Goal: Risk for impaired skin integrity will decrease Outcome: Progressing Dressing to bilateral legs dry and intact. Dressing to bilateral arms changed per order. Weeping noted from bilateral arms. Repositioned pt qx2xhrs. Heels elevated on a pillow.   Problem: Tissue Perfusion: Goal: Risk factors for ineffective tissue perfusion will decrease Outcome: Progressing As above.  Problem: Activity: Goal: Risk for activity intolerance will decrease Outcome: Progressing Up to the chair today with PT.   Problem: Nutrition: Goal: Adequate nutrition will be maintained Outcome: Progressing Pt ate well today.

## 2015-04-10 LAB — BASIC METABOLIC PANEL
Anion gap: 6 (ref 5–15)
BUN: 47 mg/dL — ABNORMAL HIGH (ref 6–20)
CHLORIDE: 109 mmol/L (ref 101–111)
CO2: 21 mmol/L — ABNORMAL LOW (ref 22–32)
CREATININE: 2.05 mg/dL — AB (ref 0.61–1.24)
Calcium: 9 mg/dL (ref 8.9–10.3)
GFR, EST AFRICAN AMERICAN: 34 mL/min — AB (ref >60)
GFR, EST NON AFRICAN AMERICAN: 29 mL/min — AB (ref >60)
Glucose, Bld: 107 mg/dL — ABNORMAL HIGH (ref 65–99)
POTASSIUM: 3.4 mmol/L — AB (ref 3.5–5.1)
SODIUM: 136 mmol/L (ref 135–145)

## 2015-04-10 LAB — PROTIME-INR
INR: 2.41
Prothrombin Time: 26 seconds — ABNORMAL HIGH (ref 11.4–15.0)

## 2015-04-10 LAB — MAGNESIUM: Magnesium: 2.1 mg/dL (ref 1.7–2.4)

## 2015-04-10 NOTE — Progress Notes (Addendum)
[facility] - [place] at [facility]   PATIENT NAME: Blake White    MR#:  [id number]  DATE OF BIRTH:  1933/06/06  SUBJECTIVE:  CHIEF COMPLAINT:   Chief Complaint  Patient presents with  . Weakness  no complaints  REVIEW OF SYSTEMS:     Review of Systems  Constitutional: Negative for fever, chills and weight loss.  HENT: Negative for ear pain and tinnitus.   Eyes: Negative for blurred vision, double vision and discharge.  Respiratory: Negative for cough, shortness of breath and wheezing.   Cardiovascular: Negative for chest pain, palpitations and leg swelling.  Gastrointestinal: Negative for heartburn, nausea, vomiting and abdominal pain.  Genitourinary: Negative for dysuria, urgency and frequency.  Musculoskeletal: Negative for myalgias.  Skin: Negative for itching and rash.  Neurological: Positive for weakness. Negative for dizziness, tingling, tremors, focal weakness and headaches.    DRUG ALLERGIES:  No Known Allergies  VITALS:  Blood pressure 95/60, pulse 66, temperature 98.1 F (36.7 C), temperature source Oral, resp. rate 20, height  (1.753 m), weight 100.29 kg (221 lb 1.6 oz), SpO2 99 %.  PHYSICAL EXAMINATION:  GENERAL:  81 y.o.-year-old patient lying in the bed with no acute distress.  EYES: Pupils equal, round, reactive to light and accommodation. No scleral icterus.  HEENT: Head atraumatic, normocephalic. Oropharynx and nasopharynx clear.  NECK:  Supple, no jugular venous distention. No thyroid enlargement, no tenderness.  LUNGS: Normal breath sounds bilaterally, no wheezing, some crepitation. No use of accessory muscles of respiration.  CARDIOVASCULAR: S1, S2 normal. No murmurs, rubs, or gallops.  ABDOMEN: Soft, nontender, distended. Bowel sounds present. No organomegaly or mass.  EXTREMITIES: some pedal edema, cyanosis, or clubbing. Some skin breakdowns with oozing fluids. Elastic wraps on both legs. NEUROLOGIC: moves all limbs,  follows commands, power 4/5. PSYCHIATRIC: The patient is alert and oriented.  SKIN: No obvious rash, lesion, or ulcer.   Physical Exam LABORATORY PANEL:   CBC  Recent Labs Lab 04/09/15 0448  WBC 7.0  HGB 11.6*  HCT 35.3*  PLT 151   ------------------------------------------------------------------------------------------------------------------  Chemistries   Recent Labs Lab 04/08/15 0424  04/10/15 0539  NA 135  < > 136  K 3.5  < > 3.4*  CL 107  < > 109  CO2 23  < > 21*  GLUCOSE 95  < > 107*  BUN 49*  < > 47*  CREATININE 2.27*  < > 2.05*  CALCIUM 8.9  < > 9.0  MG  --   < > 2.1  AST 55*  --   --   ALT 43  --   --   ALKPHOS 89  --   --   BILITOT 1.6*  --   --   < > = values in this interval not displayed. ------------------------------------------------------------------------------------------------------------------  Cardiac Enzymes  Recent Labs Lab 04/06/15 1151  TROPONINI <0.03   ------------------------------------------------------------------------------------------------------------------  RADIOLOGY:  [place] Abdomen Limited  04/09/2015  CLINICAL DATA:  Abdominal distention EXAM: LIMITED ABDOMEN ULTRASOUND FOR ASCITES TECHNIQUE: Limited ultrasound survey for ascites was performed in all four abdominal quadrants. COMPARISON:  01/17/2015 FINDINGS: Small, borderline mild ascites, increased from comparison study. The largest pocket is 2 cm in depth, at the level of the liver. Volume of ascites would make clinical tap difficult. No evidence of loculation or internal complexity. Cirrhosis, better demonstrated on previous sonography. IMPRESSION: Small volume ascites but increased from 01/17/2015. Electronically Signed   By: Marnee Spring M.D.   On: 04/09/2015 09:29  ASSESSMENT AND PLAN:   Active Problems:   Hepatic encephalopathy (HCC)  * Hepatic encephalopathy and altered mental status due to infection Started on lactulose and rifaximin.  ammonia  improved.   His urine culture reported positive with resistant Klebsiella.  Maybe this was also playing a role in his altered mental status.  * Lower extremity stasis changes Likely swelling is contributed from liver failure.  Continue elastic wraps.   Less likely to have cellulitis as it is same as in past.  * UTI  Urine culture and it is resistant Klebsiella.  Per Dr. Sampson GoonFitzgerald, do not recommend any antibiotics at this time to avoid a recurrence of C diff.  * CKD stage IV is stable  * Atrial fibrillation No tachycardia. Continue medications along with Coumadin.   Pharmacy to help manage coumadin dose.  * Acute on chronic systolic CHF   Low EF per past Echo.  Continue IV lasix-appreciated cardio consult.  * DVT prophylaxis On Coumadin  * abdominal distention   US abdomen- shows small ascites.   Dr. Orvan Falconerampbell will meet with family Monday morning and we will see where things stand at that time.  All the records are reviewed and case discussed with Care Management/Social Workerr. Management plans discussed with the patient, his wife, daughter and they are in agreement. Greater than 50% time was spent on coordination of care and face-to-face counseling.  CODE STATUS: DNR.  TOTAL TIME TAKING CARE OF THIS PATIENT: 38 minutes.    POSSIBLE D/C IN 3 DAYS, DEPENDING ON CLINICAL CONDITION.   Shaune Pollackhen, Jamarien Rodkey M.D on 04/10/2015   Between 7am to 6pm - Pager - [phone]  After 6pm go to [email or web address] - password [email or web address]  [facility] [place] [facility]  Office  [phone]  CC: Primary care physician; [name], Charanjit, MD  Note: This dictation was prepared with Dragon dictation along with smaller phrase technology. Any transcriptional errors that result from this process are unintentional.

## 2015-04-10 NOTE — Plan of Care (Signed)
Problem: Education: Goal: Knowledge of [place] [facility] Education information/materials will improve Outcome: Progressing Foley remains.  Problem: Safety: Goal: Ability to remain free from injury will improve Outcome: Progressing Patient calls out for assistance. High fall risk  Problem: Skin Integrity: Goal: Risk for impaired skin integrity will decrease Outcome: Progressing Dressings changed due to excessive drainage. Limited movement in bed. Turning patient  Problem: Activity: Goal: Risk for activity intolerance will decrease Outcome: Progressing See skin integrity plan  Problem: Nutrition: Goal: Adequate nutrition will be maintained Outcome: Progressing Patient eating fair today 25-50% of meals.

## 2015-04-10 NOTE — Progress Notes (Signed)
ANTICOAGULATION CONSULT NOTE - Follow Up Consult  Pharmacy Consult for wafarin Indication: atrial fibrillation  No Known Allergies  Patient Measurements: Height: 5\' 9"  (175.3 cm) Weight: 221 lb 1.6 oz (100.29 kg) IBW/kg (Calculated) : 70.7   Vital Signs: Temp: 97.7 F (36.5 C) (12/03 0549) Temp Source: Oral (12/03 0549) BP: 96/52 mmHg (12/03 0549) Pulse Rate: 61 (12/03 0549)  Labs:  Recent Labs  04/08/15 0424 04/09/15 0448 04/10/15 0539  HGB  --  11.6*  --   HCT  --  35.3*  --   PLT  --  151  --   LABPROT 31.7* 29.1* 26.0*  INR 3.14 2.80 2.41  CREATININE 2.27* 2.31* 2.05*    Estimated Creatinine Clearance: 34.1 mL/min (by C-G formula based on Cr of 2.05).   Medical History: Past Medical History  Diagnosis Date  . CHF (congestive heart failure) (HCC)   . BPH (benign prostatic hyperplasia)   . A-fib (HCC)   . Thyroid disease   . High cholesterol   . Bilateral lower extremity edema   . CAD (coronary artery disease)   . Cirrhosis (HCC)   . CVA (cerebral vascular accident) (HCC)   . Gout   . Hypothyroidism   . Myocardial infarction (HCC)   . Obesity   . Dysrhythmia   . History of chickenpox   . ED (erectile dysfunction)     Medications:  Scheduled:  . allopurinol  150 mg Oral Daily  . amiodarone  200 mg Oral Daily  . antiseptic oral rinse  7 mL Mouth Rinse q12n4p  . aspirin EC  81 mg Oral Daily  . famotidine  20 mg Oral Daily  . lactulose  30 g Oral BID  . levothyroxine  150 mcg Oral QAC breakfast  . midodrine  5 mg Oral TID WC  . potassium chloride  20 mEq Oral BID  . saccharomyces boulardii  250 mg Oral BID  . warfarin  1.5 mg Oral q1800  . Warfarin - Pharmacist Dosing Inpatient   Does not apply q1800    Assessment: Pharmacy consulted to dose wafarin in a 81 yo male with atrial fibrillation. PTA wafarin dosing of 2 mg po daily.  INR History: 11/30- INR: 3.14, warfarin held 12/1 - INR: 3.14, warfarin held 12/2 - INR: 2.8, warfarin 1.5  mg 12/3-  INR: 2.41  Goal of Therapy:  INR 2-3 Monitor platelets by anticoagulation protocol: Yes   Plan:  INR within therapeutic range.  Will continue current dosing of warfarin 1.5 mg po daily.  Will recheck INR in AM.   Pharmacy will continue to follow.   Luisa Harthristy, Apoorva Bugay D 04/10/2015,8:25 AM

## 2015-04-11 LAB — PROTIME-INR
INR: 2.19
Prothrombin Time: 24.2 seconds — ABNORMAL HIGH (ref 11.4–15.0)

## 2015-04-11 NOTE — Progress Notes (Signed)
[facility] - [place] at [facility]   PATIENT NAME: Sherryl MangesBobby Clubb    MR#:  [id number]  DATE OF BIRTH:  04/26/1934  SUBJECTIVE:  CHIEF COMPLAINT:   Chief Complaint  Patient presents with  . Weakness  no complaints  REVIEW OF SYSTEMS:     Review of Systems  Constitutional: Negative for fever, chills and weight loss.  HENT: Negative for ear pain and tinnitus.   Eyes: Negative for blurred vision, double vision and discharge.  Respiratory: Negative for cough, shortness of breath and wheezing.   Cardiovascular: Negative for chest pain, palpitations and leg swelling.  Gastrointestinal: Negative for heartburn, nausea, vomiting and abdominal pain.  Genitourinary: Negative for dysuria, urgency and frequency.  Musculoskeletal: Negative for myalgias.  Skin: Negative for itching and rash.  Neurological: Positive for weakness. Negative for dizziness, tingling, tremors, focal weakness and headaches.    DRUG ALLERGIES:  No Known Allergies  VITALS:  Blood pressure 95/52, pulse 66, temperature 97.9 F (36.6 C), temperature source Oral, resp. rate 18, height 5\' 9"  (1.753 m), weight 95.119 kg (209 lb 11.2 oz), SpO2 98 %.  PHYSICAL EXAMINATION:  GENERAL:  81 y.o.-year-old patient lying in the bed with no acute distress.  EYES: Pupils equal, round, reactive to light and accommodation. No scleral icterus.  HEENT: Head atraumatic, normocephalic. Oropharynx and nasopharynx clear.  NECK:  Supple, no jugular venous distention. No thyroid enlargement, no tenderness.  LUNGS: Normal breath sounds bilaterally, no wheezing, some crepitation. No use of accessory muscles of respiration.  CARDIOVASCULAR: S1, S2 normal. No murmurs, rubs, or gallops.  ABDOMEN: Soft, nontender, distended. Bowel sounds present. No organomegaly or mass.  EXTREMITIES: some pedal edema, cyanosis, or clubbing.  Both arms in dressing for Some skin breakdowns with oozing fluids. Elastic wraps on both  legs. NEUROLOGIC: moves all limbs, follows commands, power 4/5. PSYCHIATRIC: The patient is alert and oriented.  SKIN: No obvious rash, lesion, or ulcer.   Physical Exam LABORATORY PANEL:   CBC  Recent Labs Lab 04/09/15 0448  WBC 7.0  HGB 11.6*  HCT 35.3*  PLT 151   ------------------------------------------------------------------------------------------------------------------  Chemistries   Recent Labs Lab 04/08/15 0424  04/10/15 0539  NA 135  < > 136  K 3.5  < > 3.4*  CL 107  < > 109  CO2 23  < > 21*  GLUCOSE 95  < > 107*  BUN 49*  < > 47*  CREATININE 2.27*  < > 2.05*  CALCIUM 8.9  < > 9.0  MG  --   < > 2.1  AST 55*  --   --   ALT 43  --   --   ALKPHOS 89  --   --   BILITOT 1.6*  --   --   < > = values in this interval not displayed. ------------------------------------------------------------------------------------------------------------------  Cardiac Enzymes  Recent Labs Lab 04/06/15 1151  TROPONINI <0.03   ------------------------------------------------------------------------------------------------------------------  RADIOLOGY:  No results found.  ASSESSMENT AND PLAN:   Active Problems:   Hepatic encephalopathy (HCC)  * Hepatic encephalopathy and altered mental status due to infection continue lactulose and rifaximin.  improved.   His urine culture reported positive with resistant Klebsiella.  Maybe this was also playing a role in his altered mental status.  * Lower extremity stasis changes Likely swelling is contributed from liver failure.  Continue elastic wraps.   Less likely to have cellulitis as it is same as in past.  * UTI  Urine culture and it  is resistant Klebsiella.  Per Dr. Sampson Goon, do not recommend any antibiotics at this time to avoid a recurrence of C diff.  * CKD stage IV, improving.  * Atrial fibrillation No tachycardia. Continue medications along with Coumadin.   Pharmacy to help manage coumadin dose.  *  Acute on chronic systolic CHF   Low EF per past Echo.  Continue IV lasix-appreciated cardio consult.  * DVT prophylaxis On Coumadin  * abdominal distention   US abdomen- shows small ascites.   Dr. Orvan Falconer will meet with family Monday morning and we will see where things stand at that time.  All the records are reviewed and case discussed with Care Management/Social Workerr. Management plans discussed with the patient, his wife,  and they are in agreement. Greater than 50% time was spent on coordination of care and face-to-face counseling.  CODE STATUS: DNR.  TOTAL TIME TAKING CARE OF THIS PATIENT: 33 minutes.    POSSIBLE D/C IN 3 DAYS, DEPENDING ON CLINICAL CONDITION.   Shaune Pollack M.D on 04/11/2015   Between 7am to 6pm - Pager - [phone]  After 6pm go to [email or web address] - password [email or web address]  [facility]  Office  [phone]  CC: Primary care physician; Sula Rumple, MD  Note: This dictation was prepared with Dragon dictation along with smaller phrase technology. Any transcriptional errors that result from this process are unintentional.

## 2015-04-11 NOTE — Plan of Care (Signed)
Problem: Education: Goal: Knowledge of [place] [facility] Education information/materials will improve Outcome: Progressing Discussed with patient plans for discharge he stated he did not know .  Wife not in room  Problem: Safety: Goal: Ability to remain free from injury will improve Outcome: Progressing Pt has not attempted to get oob.  Turned to help pressure injury.  Problem: Health Behavior/Discharge Planning: Goal: Ability to manage health-related needs will improve Outcome: Not Progressing Unsure if pt or with would be able to care for him if he went home.  Care management and palliative care involved.  Problem: Skin Integrity: Goal: Risk for impaired skin integrity will decrease Outcome: Not Progressing Continues to have weeping from right arm.  Bil legs with UNNA boot in place.Skin provide after each BM      Problem: Tissue Perfusion: Goal: Risk factors for ineffective tissue perfusion will decrease Outcome: Not Progressing Bil leg edema. No obvious signs of clot  Problem: Activity: Goal: Risk for activity intolerance will decrease Outcome: Not Progressing Pt assist in moving side to side but has not gotten oob this shift.  Problem: Nutrition: Goal: Adequate nutrition will be maintained Outcome: Progressing Pt able to feed self with set up asssit.

## 2015-04-11 NOTE — Plan of Care (Signed)
Problem: Education: Goal: Knowledge of [place] [facility] Education information/materials will improve Outcome: Progressing Foley catheter continues per MD order. High fall risk with bed alarm activated. Pt verbalized understanding of use of call light.   Problem: Safety: Goal: Ability to remain free from injury will improve Outcome: Progressing High fall risk with bed alarm activated. Patient has remained free of injury or fall this shift.  Problem: Skin Integrity: Goal: Risk for impaired skin integrity will decrease Outcome: Progressing Pt has BLE una boots changed weekly on Wednesdays. BLU ext also noted with skin tears and weeping, dressing to BL upper ext. Changed due to saturation. Pt is unable to reposition in the bed, offering turns with each safety round.   Problem: Nutrition: Goal: Adequate nutrition will be maintained Outcome: Progressing Per report patient eating 25-50% of meals, per report, improvement.

## 2015-04-11 NOTE — Progress Notes (Signed)
ANTICOAGULATION CONSULT NOTE - Follow Up Consult  Pharmacy Consult for wafarin Indication: atrial fibrillation  No Known Allergies  Patient Measurements: Height: 5\' 9"  (175.3 cm) Weight: 209 lb 11.2 oz (95.119 kg) IBW/kg (Calculated) : 70.7   Vital Signs: Temp: 97.9 F (36.6 C) (12/04 0502) Temp Source: Oral (12/04 0502) BP: 95/52 mmHg (12/04 0502) Pulse Rate: 66 (12/04 0502)  Labs:  Recent Labs  04/09/15 0448 04/10/15 0539 04/11/15 0532  HGB 11.6*  --   --   HCT 35.3*  --   --   PLT 151  --   --   LABPROT 29.1* 26.0* 24.2*  INR 2.80 2.41 2.19  CREATININE 2.31* 2.05*  --     Estimated Creatinine Clearance: 33.3 mL/min (by C-G formula based on Cr of 2.05).   Medical History: Past Medical History  Diagnosis Date  . CHF (congestive heart failure) (HCC)   . BPH (benign prostatic hyperplasia)   . A-fib (HCC)   . Thyroid disease   . High cholesterol   . Bilateral lower extremity edema   . CAD (coronary artery disease)   . Cirrhosis (HCC)   . CVA (cerebral vascular accident) (HCC)   . Gout   . Hypothyroidism   . Myocardial infarction (HCC)   . Obesity   . Dysrhythmia   . History of chickenpox   . ED (erectile dysfunction)     Medications:  Scheduled:  . allopurinol  150 mg Oral Daily  . amiodarone  200 mg Oral Daily  . antiseptic oral rinse  7 mL Mouth Rinse q12n4p  . aspirin EC  81 mg Oral Daily  . famotidine  20 mg Oral Daily  . lactulose  30 g Oral BID  . levothyroxine  150 mcg Oral QAC breakfast  . midodrine  5 mg Oral TID WC  . potassium chloride  20 mEq Oral BID  . saccharomyces boulardii  250 mg Oral BID  . warfarin  1.5 mg Oral q1800  . Warfarin - Pharmacist Dosing Inpatient   Does not apply q1800    Assessment: Pharmacy consulted to dose wafarin in a 81 yo male with atrial fibrillation. PTA wafarin dosing of 2 mg po daily.  INR History: 11/30- INR: 3.14, warfarin held 12/1 - INR: 3.14, warfarin held 12/2 - INR: 2.8, warfarin 1.5  mg 12/3-  INR: 2.41 warfarin 1.5 mg 12/4-  INR: 2.19  Goal of Therapy:  INR 2-3 Monitor platelets by anticoagulation protocol: Yes   Plan:  INR within therapeutic range and still decreasing but this is probably still reflecting holding doses 11/30 and 12/1. Will continue current dosing of warfarin 1.5 mg po daily. Will recheck INR in AM.   Pharmacy will continue to follow.   Luisa Harthristy, Legacie Dillingham D 04/11/2015,7:32 AM

## 2015-04-12 LAB — BASIC METABOLIC PANEL
Anion gap: 5 (ref 5–15)
BUN: 42 mg/dL — AB (ref 6–20)
CHLORIDE: 109 mmol/L (ref 101–111)
CO2: 22 mmol/L (ref 22–32)
CREATININE: 1.78 mg/dL — AB (ref 0.61–1.24)
Calcium: 8.9 mg/dL (ref 8.9–10.3)
GFR calc Af Amer: 40 mL/min — ABNORMAL LOW (ref >60)
GFR, EST NON AFRICAN AMERICAN: 35 mL/min — AB (ref >60)
GLUCOSE: 98 mg/dL (ref 65–99)
POTASSIUM: 4.3 mmol/L (ref 3.5–5.1)
Sodium: 136 mmol/L (ref 135–145)

## 2015-04-12 LAB — CBC
HCT: 36 % — ABNORMAL LOW (ref 40.0–52.0)
HEMOGLOBIN: 12 g/dL — AB (ref 13.0–18.0)
MCH: 34.3 pg — AB (ref 26.0–34.0)
MCHC: 33.2 g/dL (ref 32.0–36.0)
MCV: 103.2 fL — AB (ref 80.0–100.0)
Platelets: 152 10*3/uL (ref 150–440)
RBC: 3.49 MIL/uL — AB (ref 4.40–5.90)
RDW: 17.2 % — ABNORMAL HIGH (ref 11.5–14.5)
WBC: 6.9 10*3/uL (ref 3.8–10.6)

## 2015-04-12 LAB — PROTIME-INR
INR: 2.1
Prothrombin Time: 23.4 seconds — ABNORMAL HIGH (ref 11.4–15.0)

## 2015-04-12 MED ORDER — TAMSULOSIN HCL 0.4 MG PO CAPS
0.4000 mg | ORAL_CAPSULE | Freq: Every day | ORAL | Status: DC
  Administered 2015-04-12: 0.4 mg via ORAL
  Filled 2015-04-12: qty 1

## 2015-04-12 MED ORDER — WARFARIN SODIUM 2 MG PO TABS
2.0000 mg | ORAL_TABLET | Freq: Every day | ORAL | Status: DC
  Administered 2015-04-12: 18:00:00 2 mg via ORAL
  Filled 2015-04-12: qty 1

## 2015-04-12 NOTE — Progress Notes (Signed)
Palliative Care follow-up reveals patient and family in agreement with Hospice services to be provided in the home. Discussed removal of patient's foley catheter to which patient and family in agreement with. Family also adamant about PT involvement. PT to assess prior to discharge. Clydie BraunKaren, RN with Hospice made aware of patient/family wishes and will proceed with consult. Discussed with care team including Annye AsaBrenda H, RN care manager, Dr. Imogene Burnhen and Dr. Orvan Falconerampbell who will proceed with discharge tomorrow. Family agrees with above plan.   [place]ford ShiEmily Howell, MSW, LCSW Palliative Care social worker [phone] (c)

## 2015-04-12 NOTE — Progress Notes (Signed)
Dressings to BL upper extremities moist with drainage. Changed dressing per family request.  

## 2015-04-12 NOTE — Plan of Care (Signed)
Problem: Education: Goal: Knowledge of [facility] Education information/materials will improve Outcome: Progressing Care plan reviewed with patient and wife, no questions or concerns at this time. Wife states that she will be speaking with Dr. Orvan Falconerampbell in the morning.      Problem: Safety: Goal: Ability to remain free from injury will improve Outcome: Progressing High fall risk, bed alarm activated. Patient remains free of injury or fall this shift.      Problem: Health Behavior/Discharge Planning: Goal: Ability to manage health-related needs will improve Outcome: Progressing Per report patient may be discharge to or with hospice. Patients wife states that Dr. Orvan Falconerampbell will be in today to discuss plan.   Problem: Skin Integrity: Goal: Risk for impaired skin integrity will decrease Outcome: Progressing BLE wrapped (Una boot) dry and intact. Upper BLE dressing changed per family request due to dressing saturated. Pt has decreased mobility and requires repositioning with each safety round.    Problem: Nutrition: Goal: Adequate nutrition will be maintained Outcome: Progressing Decreased PO intake per report. Snack offered and choices provided. Pt declined. Oral care provided with repositioning.

## 2015-04-12 NOTE — Progress Notes (Signed)
Dressings to BL upper extremities moist with drainage. Changed dressing per family request.

## 2015-04-12 NOTE — Progress Notes (Signed)
Physical Therapy Treatment Patient Details Name: Blake White MRN: [id number] DOB: [birth year] Today's Date: 04/12/2015    History of Present Illness [name] is a 81 y.o. 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 Pt presents from [facility] where he was staying after last admission. Patient had fecal transplant done by Dr. Mechele Collin in one week back. Seems to have done well after this. But over the last 3 days has been extremely weak and confused. Initially family requested patient be transferred to [facility] as they were not happy with his recent hospital stay. Emergency room physician discussed with [facility] and transfer was refused as patient's care was possible at [facility]. Family is unhappy but have agreed for patient admission here the hospital. Initially patient was thought to have lower extremity cellulitis and vancomycin ordered by the ER physician. Wife at bedside mentions that he has always had stasis changes in his lower extremities and his legs look the same. Patient is afebrile and has normal white blood cell count. Urinalysis has shown some bacteria with 6-30 WBC. Ammonia level is 53.History has been obtained from old records, emergency room staff, family at bedside. Patient is fully oriented at time of PT evaluation. Reports 2 falls over the last 12 months.     PT Comments     Pt tolerating treatment session well, motivated and able to complete entire PT sesssion as planned. Pt continues to make progress toward goals as evidenced by addition of new exercises.  Pt remains quite lethargic, moving slowly, antalgicly, and cautiously. Pt's greatest limitation continues to be profound weakness right>Left, which continues to limit ability to perform all mobility and ADL at baseline function. This  treatment session was also limited due to bleeding from wound dressing. Patient presenting with impairment of strength, pain, range of motion, and activity tolerance, limiting ability to perform ADL and mobility tasks at  baseline level of function. Patient will benefit from skilled intervention to address the above impairments and limitations, in order to restore to maintain comfort and quality of life, and to decrease caregiver burden.     Follow Up Recommendations  Other (comment);Supervision/Assistance - 24 hour (Family taking pt home with hospice services)     Equipment Recommendations  None recommended by PT    Recommendations for Other Services       Precautions / Restrictions Precautions Precautions:  (no falls score available.) Precaution Comments: enteric isolation Restrictions Weight Bearing Restrictions: No Other Position/Activity Restrictions: RLEwounds are leaking blood when limb is not elevated; BUE wounds seeping heavily when not elevated.     Mobility  Bed Mobility Overal bed mobility: Needs Assistance;+2 for physical assistance Bed Mobility: Supine to Sit     Supine to sit: Mod assist;+2 for physical assistance     General bed mobility comments: Patient limited by abdominal swelling/pain, and moderate weakness in limbs. requiress elevated bed and BUE. Once seated at EOB, dressing demonstrating continuous drip of blood onto floor.   Transfers Overall transfer level: Needs assistance Equipment used: Rolling walker (2 wheeled) Transfers: Sit to/from Stand Sit to Stand: Min assist         General transfer comment: Bradykinetic and weak, but appears stable once up. Limited strength for weight shifting, RLE moderately more weak than L. Continued blood dripping from RLE, elevated once seated.   Ambulation/Gait Ambulation/Gait  assistance: Min guard Ambulation Distance (Feet): 3 Feet Assistive device: Rolling walker (2 wheeled)       General Gait Details:  Very slow, difficulty with weight shifting and moving each limb.    Stairs            Wheelchair Mobility    Modified Rankin (Stroke Patients Only)       Balance Overall balance assessment: No apparent balance deficits (not formally assessed)                                  Cognition Arousal/Alertness: Lethargic Behavior During Therapy: WFL for tasks assessed/performed Overall Cognitive Status: Within Functional Limits for tasks assessed                      Exercises General Exercises - Lower Extremity Long Arc Quad: Strengthening;Both;15 reps;Seated Hip Flexion/Marching: Strengthening;Both;15 reps;Seated Heel Raises: AROM;Strengthening;Both;15 reps;Seated (Manually resisted plantar flexion seated. ) Other Exercises Other Exercises: Manually resisted Knee flexion in sitting: 1x15 bilat Other Exercises: Manually resisted chest press 1x15 bilat Other Exercises: Manually resisted UE row 1x15 bilat Other Exercises: BUE functional Y's overhead reach 1x15    General Comments        Pertinent Vitals/Pain Pain Assessment: No/denies pain    Home Living                      Prior Function            PT Goals (current goals can now be found in the care plan section) Acute Rehab PT Goals Patient Stated Goal: to get stronger PT Goal Formulation: With patient/family Time For Goal Achievement: 04/23/15 Potential to Achieve Goals: Fair Progress towards PT goals: Progressing toward goals    Frequency  Min 2X/week    PT Plan Discharge plan needs to be updated (Family has decided to take patient home with hospice services. )    Co-evaluation             End of Session Equipment Utilized During Treatment: Gait belt Activity Tolerance: Patient tolerated treatment well;Patient limited by lethargy;Patient limited by pain Patient left: in chair;with call bell/phone within reach;with chair alarm set;with family/visitor present      Time: 1510-1545 PT Time Calculation (min) (ACUTE ONLY): 35 min  Charges:  $Therapeutic Exercise: 8-22 mins $Therapeutic Activity: 8-22 mins                    G Codes:      Iola Turri C 04/12/2015, 4:02 PM  4:05 PM  Rosamaria LintsAllan C Adaleen Hulgan, PT, DPT [place] License # [id number]

## 2015-04-12 NOTE — Consult Note (Signed)
Palliative Medicine Inpatient Consult Note   Name: Blake White Date: 04/12/2015 MRN: [id number]  DOB: 1933-10-29  Referring Physician: Demetrios Loll, MD  Palliative Care consult requested for this 81 y.o. 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  He has numerous other problems as detailed under 'Impression'.  TODAY'S DISCUSSIONS AND DECISIONS: I have met with family of pt and also have talked with pt.   Family does not want him going to a facility again --unless its Hospice Home.   BUT they really want him to go back home no sooner than Monday or Tuesday with Hospice in the Home setting. They live in [place], [place] [facility]). They would need to be presented with CHOICE by Care Mgr on Monday morning before their choice of Hospice could be consulted.   They are actually right in the middle of laying down hardwood laminate flooring today and this weekend. Then, they would want a hospital bed moved in on Monday before he is sent home.   The idea is to see if he can improve or stabilize over the weekend and then have Hospice in the Home early next week. This certainly seems reasonable. The entire family is familiar with Hospice and [place] also --from other experiences with different family members.   He is DNR status.   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  SOCIAL HISTORY:  reports that he quit smoking about 36 years ago. His smoking use included Cigarettes. He has never used smokeless tobacco. He reports that he does not drink alcohol or use illicit drugs.  His family would like him at home if at all possible.  They had hoped for more assistance with daily care issues than perhaps they will be getting, but they are definitely more interested in Hospice in the home instead of Home Health.  Family had already started the installation of laminate flooring throughout the home, so pt could not go home this weekend.  They would want a hospital bed and have a number of questions about DME, etc.    LEGAL DOCUMENTS:  None in record  CODE STATUS: DNR  PAST MEDICAL HISTORY: Past Medical History  Diagnosis Date  . CHF (congestive heart failure) ([place])   . BPH (benign prostatic hyperplasia)   . A-fib ([place])   . Thyroid disease   . High cholesterol   . Bilateral lower extremity edema   . CAD (coronary artery disease)   . Cirrhosis ([place])   . CVA (cerebral vascular accident) ([place])   . Gout   . Hypothyroidism   .  Myocardial infarction ([place])   . Obesity   . Dysrhythmia   . History of chickenpox   . ED (erectile dysfunction)     PAST SURGICAL HISTORY:  Past Surgical History  Procedure Laterality Date  . Vein bypass surgery    . Vascular surgery    . Cardiac surgery      quad bypass  . Esophagogastroduodenoscopy (egd) with propofol [name] 03/04/2015    Procedure: ESOPHAGOGASTRODUODENOSCOPY (EGD) WITH PROPOFOL;  Surgeon: Josefine Class, MD;  Location: [facility]  ENDOSCOPY;  Service: Endoscopy;  Laterality: [name];  . Coronary artery bypass graft    . Fecal transplant [name] 03/29/2015    Procedure: FECAL TRANSPLANT;  Surgeon: Manya Silvas, MD;  Location: [facility] ENDOSCOPY;  Service: Endoscopy;  Laterality: [name];    ALLERGIES:  has No Known Allergies.  MEDICATIONS:  Current Facility-Administered Medications  Medication Dose Route Frequency Provider Last Rate Last Dose  . acetaminophen (TYLENOL) tablet 650 mg  650 mg Oral Q6H PRN Hillary Bow, MD       Or  . acetaminophen (TYLENOL) suppository 650 mg  650 mg Rectal Q6H PRN Srikar Sudini, MD      . albuterol (PROVENTIL) (2.5 MG/3ML) 0.083% nebulizer solution 2.5 mg  2.5 mg Nebulization Q2H PRN Srikar Sudini, MD      . allopurinol (ZYLOPRIM) tablet 150 mg  150 mg Oral Daily Hillary Bow, MD   150 mg at 04/12/15 1014  . amiodarone (PACERONE) tablet 200 mg  200 mg Oral Daily Srikar Sudini, MD   200 mg at 04/12/15 1014  . antiseptic oral rinse (CPC / CETYLPYRIDINIUM CHLORIDE 0.05%) solution 7 mL  7 mL Mouth Rinse q12n4p Srikar Sudini, MD   7 mL at 04/11/15 1736  . aspirin EC tablet 81 mg  81 mg Oral Daily Hillary Bow, MD   81 mg at 04/12/15 1014  . famotidine (PEPCID) tablet 20 mg  20 mg Oral Daily Hillary Bow, MD   20 mg at 04/12/15 1014  . lactulose (CHRONULAC) 10 GM/15ML solution 30 g  30 g Oral BID Manya Silvas, MD   30 g at 04/12/15 1015  . levothyroxine (SYNTHROID, LEVOTHROID) tablet 150 mcg  150 mcg Oral QAC breakfast Hillary Bow, MD   150 mcg at 04/12/15 1014  . midodrine (PROAMATINE) tablet 5 mg  5 mg Oral TID WC Hillary Bow, MD   5 mg at 04/12/15 1014  . ondansetron (ZOFRAN) tablet 4 mg  4 mg Oral Q6H PRN Hillary Bow, MD       Or  . ondansetron (ZOFRAN) injection 4 mg  4 mg Intravenous Q6H PRN Srikar Sudini, MD      . oxyCODONE (Oxy IR/ROXICODONE) immediate release tablet 5 mg  5 mg Oral Q4H PRN Vaughan Basta, MD      . potassium chloride SA (K-DUR,KLOR-CON) CR tablet 20 mEq   20 mEq Oral BID Vaughan Basta, MD   20 mEq at 04/12/15 1014  . saccharomyces boulardii (FLORASTOR) capsule 250 mg  250 mg Oral BID Hillary Bow, MD   250 mg at 04/12/15 1014  . tamsulosin (FLOMAX) capsule 0.4 mg  0.4 mg Oral QPC supper Colleen Can, MD      . traMADol Veatrice Bourbon) tablet 50-100 mg  50-100 mg Oral Q12H PRN Vaughan Basta, MD      . warfarin (COUMADIN) tablet 2 mg  2 mg Oral q1800 Vaughan Basta, MD      . Warfarin - Pharmacist Dosing Inpatient   Does not apply [phone]  Loleta Dicker, RPH        Vital Signs: BP 94/45 mmHg  Pulse 64  Temp(Src) 98.6 F (37 C) (Oral)  Resp 20  Ht _0  (1.753 m)  Wt 95.255 kg (210 lb)  BMI 31.00 kg/m2  SpO2 100% Filed Weights   04/10/15 0549 04/11/15 0502 04/12/15 0522  Weight: 100.29 kg (221 lb 1.6 oz) 95.119 kg (209 lb 11.2 oz) 95.255 kg (210 lb)    Estimated body mass index is 31 kg/(m^2) as calculated from the following:   Height as of this encounter: _1  (1.753 m).   Weight as of this encounter: 95.255 kg (210 lb).  PERFORMANCE STATUS (ECOG) : 4 - Bedbound - -but pt wants to be gotten up if possible (he is very weak and lethargic however).   PHYSICAL EXAM: Weak Weak eyelids but EOMI OP is clear Neck w/o JVD or TM Hrt rrr no m Lungs with decreased BS bases Abd obese with fluid wave Ext no mottling or cyanosis as yet    REFERRALS TO BE ORDERED:  Hospice Choice to be offered if going home with Hospice [facility] for Hospice Home IF that is the chosen destination when decisions are made on Monday   More than 50% of the visit was spent in counseling/coordination of care: Yes  Time Spent: minutes

## 2015-04-12 NOTE — Consult Note (Signed)
Pt with distended abd likely ascites.  He is more awake and alert tonight. I agree with plans for him to go home with hospice care.  He has had some loose stools which are due to lactulose to help with his encephalopathy.  I will sign off.  Reconsult as needed.

## 2015-04-12 NOTE — Consult Note (Signed)
WOC wound follow up Reason for Consult: Management of chronic lower extremity venous insufficiency, skin tears to upper extremities. S/P fecal transplant for recurrent C-Diff Wound type:Chronic venous insufficiency Lower extremities are edematous with scabbed lesions. Legs are erythematous, and that is baseline for this patient. Temperature is normal. Healing skin tear to left upper forearm near elbow. New epithelial growth noted. Will keep this protected and covered.  Right forearm has a skin tear that is improving  Pressure Ulcer POA: [name] Measurement: Left arm 3 cm x 0.2 cm new epithelium and 2 cm x 0.2 cm new epithelium Right arm 3 cm x 2.2 cm x 0.2 cm  Dressing procedure/placement/frequency:Unnas boots are in place and some strike through is noted. No zinc paste wraps are available today, will change tomorrow when wraps arrive. Patient and wife in agreement with this plan. Will change weekly. Will add calcium alginate for absorption.  Cleanse left and right arm skin tears with soap and water and pat dry. Apply calcium alginate to wound bed for absorption, due to weeping. Cover with 4x4 gauze and kerlix/tape. Change Mon/Wed/Fri. Keep skin clean and dry. Turn and reposition to offload pressure, every 2 hours.  WOC team will continue to follow.  Maple HudsonKaren Junette Bernat RN BSN CWON Pager [phone]

## 2015-04-12 NOTE — Progress Notes (Signed)
[facility]   PATIENT NAME: Sherryl MangesBobby Sharpless    MR#:  [id number]  DATE OF BIRTH:  02/18/1934  SUBJECTIVE:  CHIEF COMPLAINT:   Chief Complaint  Patient presents with  . Weakness  no complaints  REVIEW OF SYSTEMS:     Review of Systems  Constitutional: Negative for fever, chills and weight loss.  HENT: Negative for ear pain and tinnitus.   Eyes: Negative for blurred vision, double vision and discharge.  Respiratory: Negative for cough, shortness of breath and wheezing.   Cardiovascular: Negative for chest pain, palpitations and leg swelling.  Gastrointestinal: Negative for heartburn, nausea, vomiting and abdominal pain.  Genitourinary: Negative for dysuria, urgency and frequency.  Musculoskeletal: Negative for myalgias.  Skin: Negative for itching and rash.  Neurological: Positive for weakness. Negative for dizziness, tingling, tremors, focal weakness and headaches.    DRUG ALLERGIES:  No Known Allergies  VITALS:  Blood pressure 101/49, pulse 64, temperature 98.6 F (37 C), temperature source Oral, resp. rate 20, height 5\' 9"  (1.753 m), weight 95.255 kg (210 lb), SpO2 99 %.  PHYSICAL EXAMINATION:  GENERAL:  [age] y.o.-year-old patient lying in the bed with no acute distress.  EYES: Pupils equal, round, reactive to light and accommodation. No scleral icterus.  HEENT: Head atraumatic, normocephalic. Oropharynx and nasopharynx clear.  NECK:  Supple, no jugular venous distention. No thyroid enlargement, no tenderness.  LUNGS: Normal breath sounds bilaterally, no wheezing, some crepitation. No use of accessory muscles of respiration.  CARDIOVASCULAR: S1, S2 normal. No murmurs, rubs, or gallops.  ABDOMEN: Soft, nontender, distended. Bowel sounds present. No organomegaly or mass.  EXTREMITIES: some pedal edema, cyanosis, or clubbing.  Both arms in dressing for Some skin breakdowns with oozing fluids. Elastic wraps on both  legs. NEUROLOGIC: moves all limbs, follows commands, power 4/5. PSYCHIATRIC: The patient is alert and oriented.  SKIN: No obvious rash, lesion, or ulcer.   Physical Exam LABORATORY PANEL:   CBC  Recent Labs Lab 04/12/15 0442  WBC 6.9  HGB 12.0*  HCT 36.0*  PLT 152   ------------------------------------------------------------------------------------------------------------------  Chemistries   Recent Labs Lab 04/08/15 0424  04/10/15 0539 04/12/15 0442  NA 135  < > 136 136  K 3.5  < > 3.4* 4.3  CL 107  < > 109 109  CO2 23  < > 21* 22  GLUCOSE 95  < > 107* 98  BUN 49*  < > 47* 42*  CREATININE 2.27*  < > 2.05* 1.78*  CALCIUM 8.9  < > 9.0 8.9  MG  --   < > 2.1  --   AST 55*  --   --   --   ALT 43  --   --   --   ALKPHOS 89  --   --   --   BILITOT 1.6*  --   --   --   < > = values in this interval not displayed. ------------------------------------------------------------------------------------------------------------------  Cardiac Enzymes  Recent Labs Lab 04/06/15 1151  TROPONINI <0.03   ------------------------------------------------------------------------------------------------------------------  RADIOLOGY:  No results found.  ASSESSMENT AND PLAN:   Active Problems:   Hepatic encephalopathy (HCC)  * Hepatic encephalopathy and altered mental status due to infection continue lactulose and rifaximin.  improved.   His urine culture reported positive with resistant Klebsiella.  Maybe this was also playing a role in his altered mental status.  * Lower extremity stasis changes Likely swelling is contributed from liver failure.  Continue elastic  wraps.   Less likely to have cellulitis as it is same as in past.  * UTI  Urine culture and it is resistant Klebsiella.  Per Dr. Sampson Goon, do not recommend any antibiotics at this time to avoid a recurrence of C diff.  * CKD stage IV, improving.  * Atrial fibrillation No tachycardia. Continue  medications along with Coumadin.   Pharmacy to help manage coumadin dose.  * Acute on chronic systolic CHF   Low EF per past Echo. Lasix was discontinued due to low BP.  * DVT prophylaxis On Coumadin  * abdominal distention   US abdomen- shows small ascites.   Per Palliative care staff, he will be discharged to home with hospice care tomorrow.  All the records are reviewed and case discussed with Care Management/Social Workerr. Management plans discussed with the patient, his wife,  and they are in agreement. Greater than 50% time was spent on coordination of care and face-to-face counseling.  CODE STATUS: DNR.  TOTAL TIME TAKING CARE OF THIS PATIENT: 35 minutes.    POSSIBLE D/C IN 1 DAYS, DEPENDING ON CLINICAL CONDITION.   Shaune Pollack M.D on 04/12/2015   Between 7am to 6pm - Pager - [phone]  After 6pm go to [email or web address] - password [email or web address]  [facility]  Office  [phone]  CC: Primary care physician; Sula Rumple, MD  Note: This dictation was prepared with Dragon dictation along with smaller phrase technology. Any transcriptional errors that result from this process are unintentional.

## 2015-04-12 NOTE — Care Management Important Message (Signed)
Important Message  Patient Details  Name: Blake White MRN: [id number] Date of Birth: 03/25/1934   Medicare Important Message Given:  Yes    Gwenette GreetBrenda S Kerilyn Cortner, RN 04/12/2015, 12:43 PM

## 2015-04-12 NOTE — Progress Notes (Signed)
Blake White CLINIC INFECTIOUS DISEASE PROGRESS NOTE Date of Admission:  04/06/2015     ID: Blake White is a 81 y.o. male with C diff and UTI  Active Problems:   Hepatic encephalopathy (HCC)  Subjective: Remains afebrile, wbc nml.  Foley removed.   ROS  Eleven systems are reviewed and negative except per hpi  Medications:  Antibiotics Given (last 72 hours)    None     . allopurinol  150 mg Oral Daily  . amiodarone  200 mg Oral Daily  . antiseptic oral rinse  7 mL Mouth Rinse q12n4p  . aspirin EC  81 mg Oral Daily  . famotidine  20 mg Oral Daily  . lactulose  30 g Oral BID  . levothyroxine  150 mcg Oral QAC breakfast  . midodrine  5 mg Oral TID WC  . potassium chloride  20 mEq Oral BID  . saccharomyces boulardii  250 mg Oral BID  . tamsulosin  0.4 mg Oral QPC supper  . warfarin  2 mg Oral q1800  . Warfarin - Pharmacist Dosing Inpatient   Does not apply q1800    Objective: Vital signs in last 24 hours: Temp:  [98.3 F (36.8 C)-98.6 F (37 C)] 98.6 F (37 C) (12/05 1329) Pulse Rate:  [64-65] 64 (12/05 1329) Resp:  [18-20] 20 (12/05 1329) BP: (94-103)/(45-53) 101/49 mmHg (12/05 1329) SpO2:  [97 %-100 %] 99 % (12/05 1329) Weight:  [95.255 kg (210 lb)] 95.255 kg (210 lb) (12/05 0522) GENERAL: 81 y.o.-year-old patient lying in the bed with no acute distress.  EYES: Pupils equal, round, reactive to light and accommodation. No scleral icterus.  HEENT: Head atraumatic, normocephalic. Oropharynx and nasopharynx clear. Dry MM NECK: Supple, no jugular venous distention. No thyroid enlargement, no tenderness.  LUNGS: Normal breath sounds bilaterally, CARDIOVASCULAR: S1, S2 normal. distant ABDOMEN: Distended, nontender, distended. Bowel sounds present. No organomegaly or mass.  EXTREMITIES: LE wrapped bil NEUROLOGIC: drowsy, minimal arousal. PSYCHIATRIC: The patient is drowsy.   Lab Results  Recent Labs  04/10/15 0539 04/12/15 0442  WBC  --  6.9  HGB  --  12.0*   HCT  --  36.0*  NA 136 136  K 3.4* 4.3  CL 109 109  CO2 21* 22  BUN 47* 42*  CREATININE 2.05* 1.78*    Microbiology: Results for orders placed or performed during the hospital encounter of 04/06/15  Urine culture     Status: None   Collection Time: 04/06/15 11:51 AM  Result Value Ref Range Status   Specimen Description URINE, CATHETERIZED  Final   Special Requests NONE  Final   Culture >=100,000 COLONIES/mL KLEBSIELLA OXYTOCA  Final   Report Status 04/08/2015 FINAL  Final   Organism ID, Bacteria KLEBSIELLA OXYTOCA  Final      Susceptibility   Klebsiella oxytoca - MIC*    AMPICILLIN >=32 RESISTANT Resistant     CEFTAZIDIME <=1 SENSITIVE Sensitive     CEFAZOLIN >=64 RESISTANT Resistant     CEFTRIAXONE <=1 SENSITIVE Sensitive     CIPROFLOXACIN <=0.25 SENSITIVE Sensitive     GENTAMICIN <=1 SENSITIVE Sensitive     IMIPENEM <=0.25 SENSITIVE Sensitive     TRIMETH/SULFA <=20 SENSITIVE Sensitive     PIP/TAZO Value in next row Sensitive      SENSITIVE<=4    * >=100,000 COLONIES/mL KLEBSIELLA OXYTOCA  C difficile quick scan w PCR reflex     Status: None   Collection Time: 04/06/15  5:07 PM  Result Value Ref Range  Status   C Diff antigen NEGATIVE NEGATIVE Final   C Diff toxin NEGATIVE NEGATIVE Final   C Diff interpretation Negative for C. difficile  Final  MRSA PCR Screening     Status: None   Collection Time: 04/06/15  8:27 PM  Result Value Ref Range Status   MRSA by PCR NEGATIVE NEGATIVE Final    Comment:        The GeneXpert MRSA Assay (FDA approved for NASAL specimens only), is one component of a comprehensive MRSA colonization surveillance program. It is not intended to diagnose MRSA infection nor to guide or monitor treatment for MRSA infections.     Studies/Results: No results found.  Assessment/Plan: Blake White is a 81 y.o. male with cirrhosis, CHF, ckd and recurrent C diff s/p fecal transplant admitted 11/29 with weakness. On admission had nml wbc, UA  with 6-30 wbc and ucx has been + for Klebsiella. He has a foley in place for the last several weeks for what sounds like urinary retention. CXR showed persistent infiltrate at L base. He had C diff testing which has been negative. He was just started on antibiotic on 12/2 (today)with ceftraixone.  There was also some concern for cellulitis but it is felt LE changes are chronic venous stasis dermatitis.  Since admission we stopped abx 12/2, foley removed today.  I am not convinced his Klebsiella culture represents true infection since only 6-30 wbc on admit UA, no leukocytosis and no fevers. He has chronic foley in place so will always have bacteruria.   Plan I again do not recommend any antibiotics at this time to avoid a recurrence of C diff. Foley has been removed today - it sounds like it is related to urinary retention so is on  flomax  Thank you very much for the consult. Will sign off.   Tyquisha Sharps   04/12/2015, 3:02 PM

## 2015-04-12 NOTE — Progress Notes (Signed)
New referral for Hospice and of [place] services after discharge received from [facility]. Patient is a  81 year old man admitted on 12/1 for hepatic encephalopathy. He has a PMH of liver cirrhosis, CKD stage IV, chronic CHF (EF 30-35% 9/16),A-fib on coumadin recurrent c. difficile w/ fecal transplant, GERD, gout, CVA, hypothyroidism. He has had 4 hospital admissions in the last 6 months. Patient has continued weakness and some confusion as well as ascites per chart note review. Family have met with Palliative Medicine physician and again with Social worker and have confirmed their choice to take patient home with hospice services to focus on comfort and avoid hospitalizations.  Writer met with patient's wife Gwinda Passe in the family room. Education initiated regarding hospice services, philosophy and team approach to care with good understanding voiced. Mrs. Fore mother had received hospice services in her home several years ago. Patient will require a hospital bed with geo mat and top half rails and an over bed table. Mrs. Ambrocio has requested delivery this evening and plan is for patient to go home via EMS tomorrow with portable DNR in place. Hospice information and contact numbers left with Mrs. Pesqueira. Patient information and DME request faxed to referral intake. CMRN Hassan Rowan made aware writer had spoken to family and that patient will require EMS transport. Thank you for the opportunity to be involved in the care of this patient.  Flo Shanks RN, BSN, [place] [facility] [place], [facility] [phone] c

## 2015-04-12 NOTE — Plan of Care (Signed)
Problem: Skin Integrity: Goal: Risk for impaired skin integrity will decrease Outcome: Progressing Dressing changes performed, turned q2h

## 2015-04-12 NOTE — Progress Notes (Signed)
ANTICOAGULATION CONSULT NOTE - Follow Up Consult  Pharmacy Consult for wafarin Indication: atrial fibrillation  No Known Allergies  Patient Measurements: Height: 5\' 9"  (175.3 cm) Weight: 210 lb (95.255 kg) IBW/kg (Calculated) : 70.7   Vital Signs: Temp: 98.6 F (37 C) (12/05 0521) Temp Source: Oral (12/05 0521) BP: 94/45 mmHg (12/05 0522) Pulse Rate: 64 (12/05 0522)  Labs:  Recent Labs  04/10/15 0539 04/11/15 0532 04/12/15 0442  HGB  --   --  12.0*  HCT  --   --  36.0*  PLT  --   --  152  LABPROT 26.0* 24.2* 23.4*  INR 2.41 2.19 2.10  CREATININE 2.05*  --  1.78*    Estimated Creatinine Clearance: 38.3 mL/min (by C-G formula based on Cr of 1.78).   Medical History: Past Medical History  Diagnosis Date  . CHF (congestive heart failure) (HCC)   . BPH (benign prostatic hyperplasia)   . A-fib (HCC)   . Thyroid disease   . High cholesterol   . Bilateral lower extremity edema   . CAD (coronary artery disease)   . Cirrhosis (HCC)   . CVA (cerebral vascular accident) (HCC)   . Gout   . Hypothyroidism   . Myocardial infarction (HCC)   . Obesity   . Dysrhythmia   . History of chickenpox   . ED (erectile dysfunction)     Assessment: Pharmacy consulted to dose wafarin in a 81 yo male with atrial fibrillation. PTA wafarin dosing of 2 mg po daily. Pt on amiodarone PTA, which has been continued on admission.  INR History: 11/30- INR: 3.14, warfarin held 12/1 - INR: 3.14, warfarin held 12/2 - INR: 2.8, warfarin 1.5 mg 12/3-  INR: 2.41, warfarin 1.5 mg 12/4-  INR: 2.19, warfarin 1.5 mg 12/5-  INR: 2.10  Goal of Therapy:  INR 2-3 Monitor platelets by anticoagulation protocol: Yes   Plan:  INR within therapeutic range but steadily decreasing further and close to low end of therapeutic range. Will increase dose to warfarin 2 mg PO for tonight.  Will recheck INR in AM and reassess in AM. Hgb/Plt count today stable/improved.   Pharmacy will continue to follow.    Crist FatWang, Ieshia Hatcher L 04/12/2015,9:44 AM

## 2015-04-12 NOTE — Clinical Social Work Note (Signed)
Clinical Social Worker was notified by Hospice CSW that pt [facility]. RNCM and [facility] CSW will continue to follow for discharge planning needs. CSW will sign off at this time as no further needs identified. Please reconsult if a need arises prior to discharge.   Dede QuerySarah Averil Digman, MSW, LCSW Clinical Social Worker [phone]

## 2015-04-13 LAB — PROTIME-INR
INR: 2.06
PROTHROMBIN TIME: 23.1 s — AB (ref 11.4–15.0)

## 2015-04-13 MED ORDER — ALLOPURINOL 300 MG PO TABS: 150.0000 mg | ORAL_TABLET | Freq: Every day | ORAL | Status: AC

## 2015-04-13 MED ORDER — GLYCOPYRROLATE 1 MG PO TABS: 1.0000 mg | ORAL_TABLET | Freq: Three times a day (TID) | ORAL | Status: AC | PRN

## 2015-04-13 MED ORDER — MORPHINE SULFATE (CONCENTRATE) 10 MG /0.5 ML PO SOLN: 5.0000 mg | ORAL | Status: AC | PRN

## 2015-04-13 MED ORDER — PROCHLORPERAZINE 25 MG RE SUPP: 25.0000 mg | Freq: Two times a day (BID) | RECTAL | Status: AC | PRN

## 2015-04-13 MED ORDER — LACTULOSE 10 GM/15ML PO SOLN: 30.0000 g | Freq: Two times a day (BID) | ORAL | Status: AC

## 2015-04-13 MED ORDER — OXYCODONE HCL 5 MG PO TABS: 5.0000 mg | ORAL_TABLET | ORAL | Status: AC | PRN

## 2015-04-13 MED ORDER — LORAZEPAM 0.5 MG PO TABS: 0.5000 mg | ORAL_TABLET | ORAL | Status: AC | PRN

## 2015-04-13 MED ORDER — TAMSULOSIN HCL 0.4 MG PO CAPS: 0.4000 mg | ORAL_CAPSULE | Freq: Every day | ORAL | Status: AC

## 2015-04-13 MED ORDER — ALBUTEROL SULFATE (2.5 MG/3ML) 0.083% IN NEBU: 2.5000 mg | INHALATION_SOLUTION | RESPIRATORY_TRACT | Status: AC | PRN

## 2015-04-13 NOTE — Consult Note (Signed)
WOC wound consult note Reason for Consult:Unnas boots replaced today.  Strikethrough drainage.  Skin tears to upper extremities have resolved.   Wound type:Chronic venous insufficiency/Trauma wounds to upper extremities resolved Pressure Ulcer POA: [name] Measurement: Left leg 2 cm x 2 cm x 0.2 cm left lateral leg 4 cm x 2 cm x 0.2 bleeds easily with cleansing Right leg 2- 1 cm x 1 cm x 0.1 cm lesions.  Bleeds easily with cleansing  Wound ZOX:WRUEAbed:Ruddy red Drainage (amount, consistency, odor) Moderate serosanguinous  No odor Periwound:Ecchymosis chronic skin changes edema to feet Dressing procedure/placement/frequency:Cleanse bilateral legs with soap and water and pat dry.  Apply calcium alginate to open wounds.  Cover with ABD for absorption.  Wrap with zinc layer followed  By self adherent Coban layer.  Change Tues and Friday and until drainage slows. Arm skin tears have resolved.  Open to air at this time.  Will not follow at this time.  Please re-consult if needed. Discharging home today.  Maple HudsonKaren Cana Mignano RN BSN CWON Pager [phone]

## 2015-04-13 NOTE — Progress Notes (Signed)
Follow up visit made to new referral for Hospice services at home after discharge. Patient in bed, staff Rn and students present. Wife not in the room. Phone call made to patient's wife to follow up on bed delivery, she stated the bed was to be delivered this morning and then will be in with her son. Plan remains for patient to discharge home today via EMS. CMRN Steward DroneBrenda and staff RN Trey PaulaJeff are aware that family is still awaiting bed delivery. Thank you. Dayna BarkerKaren Robertson RN, BSN, [facility] [place], [facility] [phone] c

## 2015-04-13 NOTE — Plan of Care (Signed)
Problem: Nutrition: Goal: Adequate nutrition will be maintained Outcome: Progressing Pt alert and oriented. No c/o pain nor distress noted. On isolation for hx of C-Diff. Possible d/c home today. Continue to monitor

## 2015-04-13 NOTE — Progress Notes (Signed)
Discharge instructions given, pt/cg verbalized understanding. 

## 2015-04-13 NOTE — Progress Notes (Signed)
Palliative Medicine Inpatient Consult Follow Up Note   Name: Blake White Y Matus Date: 04/13/2015 MRN: [id number]  DOB: 09/15/1933  Referring Physician: Shaune PollackQing Chen, MD  Palliative Care consult requested for this 81 y.o. male for goals of medical therapy in patient with hepatic encphalopathy due to cirrhosis recently diagnosed but noted to be advanced enough to be associated with ascites. He has numerous other problems as detailed under 'Impression'.   TODAY'S DISCUSSIONS AND DECISIONS: Pt and family are in agreement with plan for pt to go home with Hospice in his Home.  DC is for today (after Hospital Bed is in place).  Have discussed with Hospice Liaison.  DC meds are reconciled and RXs are written and portable DNR form is in the envelope to go with pt.  He is to continue most of his usual meds (not lasix for now due to renal failure).  He says he refused 'that liquid medicine' and didn't want a 'heavy watery diarrhea'.  I educated him about how lactulose causes loose stools and he needs one or two daily to keep him from getting confused due to build up of toxins from liver not working well.      IMPRESSION: Cirrhosis ---Advanced due to presence of ascites and hepatic encephalopathy ---THIS IS DUE TO NASH --NON-ALCOHOLIC STEATOHEPATITIS     -----Pt says he never drank alcohol and did not take a lot of tylenol (he does admit to taking a fair amount of Alleve --which does not cause liver failure, but which can cause a GI Bleed) ---No 'grading' is listed in available records but the presence of ascites and hepatic encephalopathy portends a grim prognosis for survivial Chronic CHF  A Fib Hypothyroidism Dyslipidemia Dependent edema CAD w/ h/o MI Hx of CVA Obesity Gout Recurrent C Diff 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 CHF (congestive heart failure) (HCC)   . BPH (benign prostatic hyperplasia)   . A-fib (HCC)   . Thyroid disease   . High cholesterol   . Bilateral lower extremity edema   . CAD (coronary artery disease)   . Cirrhosis (HCC)   . CVA (cerebral vascular accident) (HCC)   . Gout   . Hypothyroidism   . Myocardial infarction (HCC)   . Obesity   . Dysrhythmia   . History of chickenpox   . ED (erectile dysfunction)     PAST SURGICAL HISTORY:  Past Surgical History  Procedure Laterality Date  . Vein bypass surgery    . Vascular surgery    . Cardiac surgery      quad bypass  . Esophagogastroduodenoscopy (egd) with propofol [name] 03/04/2015    Procedure: ESOPHAGOGASTRODUODENOSCOPY (EGD) WITH PROPOFOL;  Surgeon: Elnita MaxwellMatthew Gordon Rein, MD;  [facility];  Service: Endoscopy;  Laterality: [name];  . Coronary artery bypass graft    . Fecal transplant [name] 03/29/2015    Procedure: FECAL TRANSPLANT;  Surgeon: Scot Junobert T Elliott, MD;  [facility];  Service: Endoscopy;  Laterality: [name];    Vital Signs: BP 96/46 mmHg  Pulse 61  Temp(Src) 97.9 F (36.6 C) (Oral)  Resp 18  Ht  (1.753 m)  Wt 100.018 kg (220 lb 8 oz)  BMI 32.55 kg/m2  SpO2 99% Filed Weights   04/11/15 0502 04/12/15 0522 04/13/15 0500  Weight: 95.119 kg (209 lb 11.2 oz) 95.255 kg (210 lb) 100.018 kg (220 lb 8 oz)    Estimated body mass index is 32.55 kg/(m^2) as calculated from the following:   Height as of this encounter:  (1.753 m).   Weight as of this encounter: 100.018 kg (220 lb 8 oz).  PHYSICAL EXAM: More alert --lying with hob slightly elevated in medical bed Using some  acces muscles to breathe but no conversational dyspnea is noted when he speaks in short sentences EOMI OP clear Appears fatigued but aler NO JVD or TM Hrt irreg irreg Lungs cta ant no wheezing Abd obese with nl BS Ext no mottling or cyanosis but edema is present  LABS: CBC:    Component Value Date/Time   WBC 6.9 04/12/2015 0442   WBC 5.4 01/22/2012 0955   HGB 12.0* 04/12/2015 0442   HGB 14.5 01/22/2012 0955   HCT 36.0* 04/12/2015 0442   HCT 44.7 01/22/2012 0955   PLT 152 04/12/2015 0442   PLT 101* 01/22/2012 0955   MCV 103.2* 04/12/2015 0442   MCV 103* 01/22/2012 0955   NEUTROABS 6.9* 04/06/2015 1151   NEUTROABS 3.0 01/22/2012 0955   LYMPHSABS 0.6* 04/06/2015 1151   LYMPHSABS 1.6 01/22/2012 0955   MONOABS 1.3* 04/06/2015 1151   MONOABS 0.6 01/22/2012 0955   EOSABS 0.1 04/06/2015 1151   EOSABS 0.1 01/22/2012 0955   BASOSABS 0.0 04/06/2015 1151   BASOSABS 0.1 01/22/2012 0955   Comprehensive Metabolic Panel:    Component Value Date/Time   NA 136 04/12/2015 0442   K 4.3 04/12/2015 0442   CL 109 04/12/2015 0442   CO2 22 04/12/2015 0442   BUN 42* 04/12/2015 0442   CREATININE 1.78* 04/12/2015 0442   CREATININE 2.92* 01/22/2012 0955   GLUCOSE 98 04/12/2015 0442   CALCIUM 8.9 04/12/2015 0442   AST 55* 04/08/2015 0424   ALT 43 04/08/2015 0424   ALKPHOS 89 04/08/2015 0424   BILITOT 1.6* 04/08/2015 0424   PROT 5.2* 04/08/2015 0424   ALBUMIN 2.2* 04/08/2015 0424   ALBUMIN 3.9 07/11/2011 1045      More than 50% of the visit was spent in counseling/coordination of care: YES  Time Spent:  35 min

## 2015-04-13 NOTE — Care Management (Signed)
Discharge to home today per Dr. Imogene Burnhen.Will be followed by [facility] in the home. Arrangements with [facility]. Family and Mr. Tanya Nonesickard are in agreement with plan. Gwenette GreetBrenda S Shawni Volkov RN MSN CCM Care Management [phone]

## 2015-04-13 NOTE — Clinical Documentation Improvement (Signed)
Internal Medicine  Abnormal Lab/Test Results:  "Gas throughout small and redundant large bowel loops most suggestive of diffuse ileus" Was this condition ruled in or ruled out. Thank You   Possible Clinical Conditions associated with below indicators  Ileus   Other Condition  Cannot Clinically Determine   Supporting Information: abd xray   "Gas throughout small and redundant large bowel loops most suggestive of diffuse ileus"     Please exercise your independent, professional judgment when responding. A specific answer is not anticipated or expected.   Thank You,  Lavonda JumboLawanda J Kegan Mckeithan Health Information Management [place] [phone]

## 2015-04-13 NOTE — Progress Notes (Signed)
ANTICOAGULATION CONSULT NOTE - Follow Up Consult  Pharmacy Consult for wafarin Indication: atrial fibrillation  No Known Allergies  Patient Measurements: Height: 5\' 9"  (175.3 cm) Weight: 220 lb 8 oz (100.018 kg) IBW/kg (Calculated) : 70.7   Vital Signs: Temp: 97.9 F (36.6 C) (12/06 0514) Temp Source: Oral (12/06 0514) BP: 96/46 mmHg (12/06 0514) Pulse Rate: 61 (12/06 0514)  Labs:  Recent Labs  04/11/15 0532 04/12/15 0442 04/13/15 0448  HGB  --  12.0*  --   HCT  --  36.0*  --   PLT  --  152  --   LABPROT 24.2* 23.4* 23.1*  INR 2.19 2.10 2.06  CREATININE  --  1.78*  --     Estimated Creatinine Clearance: 39.2 mL/min (by C-G formula based on Cr of 1.78).   Medical History: Past Medical History  Diagnosis Date  . CHF (congestive heart failure) (HCC)   . BPH (benign prostatic hyperplasia)   . A-fib (HCC)   . Thyroid disease   . High cholesterol   . Bilateral lower extremity edema   . CAD (coronary artery disease)   . Cirrhosis (HCC)   . CVA (cerebral vascular accident) (HCC)   . Gout   . Hypothyroidism   . Myocardial infarction (HCC)   . Obesity   . Dysrhythmia   . History of chickenpox   . ED (erectile dysfunction)     Assessment: Pharmacy consulted to dose wafarin in a 81 yo male with atrial fibrillation. PTA wafarin dosing of 2 mg po daily. Pt on amiodarone PTA, which has been continued on admission.  INR History: 11/30- INR: 3.14, warfarin held 12/1 - INR: 3.14, warfarin held 12/2 - INR: 2.8, warfarin 1.5 mg 12/3-  INR: 2.41, warfarin 1.5 mg 12/4-  INR: 2.19, warfarin 1.5 mg 12/5-  INR: 2.10, warfarin 2 mg 12/6-  INR: 2.06  Goal of Therapy:  INR 2-3 Monitor platelets by anticoagulation protocol: Yes   Plan:  INR within therapeutic range but steadily decreasing and close to low end of therapeutic range. Will continue warfarin 2 mg PO for tonight.  Will recheck INR in AM and reassess in AM.   Pharmacy will continue to follow.   Crist FatWang,  Matrice Herro L 04/13/2015,8:34 AM

## 2015-04-13 NOTE — Discharge Summary (Signed)
[facility]   PATIENT NAME: Blake White    MR#:  [id number]  DATE OF BIRTH:  [birth year]  DATE OF ADMISSION:  04/06/2015 ADMITTING PHYSICIAN: Milagros Loll, MD  DATE OF DISCHARGE:  04/13/2015  PRIMARY CARE PHYSICIAN: Sula Rumple, MD    ADMISSION DIAGNOSIS:  Generalized weakness [R53.1] CKD (chronic kidney disease), unspecified stage [N18.9] Cellulitis of lower extremity, unspecified laterality [L03.119]   DISCHARGE DIAGNOSIS:   Hepatic encephalopathy Acute on chronic systolic CHF UTI Lower extremity stasis changes SECONDARY DIAGNOSIS:   Past Medical History  Diagnosis Date  . CHF (congestive heart failure) (HCC)   . BPH (benign prostatic hyperplasia)   . A-fib (HCC)   . Thyroid disease   . High cholesterol   . Bilateral lower extremity edema   . CAD (coronary artery disease)   . Cirrhosis (HCC)   . CVA (cerebral vascular accident) (HCC)   . Gout   . Hypothyroidism   . Myocardial infarction (HCC)   . Obesity   . Dysrhythmia   . History of chickenpox   . ED (erectile dysfunction)     HOSPITAL COURSE:   * Hepatic encephalopathy and altered mental status due to infection and elevated ammonia He has been treated with lactulose and rifaximin.Ammonia level decreased and mental status improved. His urine culture reported positive with resistant Klebsiella. Maybe this was also playing a role in his altered mental status.  * Lower extremity stasis changes Likely swelling is contributed from liver failure. Continue elastic wraps.  Less likely to have cellulitis as it is same as in past.  * UTI Urine culture and it is resistant Klebsiella. Per Dr. Sampson Goon, do not recommend any antibiotics at this time to avoid a recurrence of C diff.  * CKD stage IV, improving. Cr. Decreased to 1.78.  * Atrial fibrillation No tachycardia. Continue medications along with Coumadin. Follow-up INR as outpatient.  * Acute on  chronic systolic CHF  Low EF per past Echo. Lasix was discontinued due to low BP.  * abdominal distention  US abdomen- shows small ascites.   DISCHARGE CONDITIONS:   Stable, discharged to home with hospice care today  CONSULTS OBTAINED:  Treatment Team:  Scot Jun, MD Dalia Heading, MD Lamar Blinks, MD Shaune Pollack, MD  DRUG ALLERGIES:  No Known Allergies  DISCHARGE MEDICATIONS:   Current Discharge Medication List    START taking these medications   Details  albuterol (PROVENTIL) (2.5 MG/3ML) 0.083% nebulizer solution Take 3 mLs (2.5 mg total) by nebulization every 4 (four) hours as needed for wheezing or shortness of breath. Qty: 75 mL, Refills: 12    glycopyrrolate (ROBINUL) 1 MG tablet Take 1 tablet (1 mg total) by mouth 3 (three) times daily as needed (prn excessive secretions). Qty: 15 tablet, Refills: 0    lactulose (CHRONULAC) 10 GM/15ML solution Take 45 mLs (30 g total) by mouth 2 (two) times daily. Qty: 236 mL, Refills: 0    LORazepam (ATIVAN) 0.5 MG tablet Take 1 tablet (0.5 mg total) by mouth every 4 (four) hours as needed for anxiety. Qty: 15 tablet, Refills: 0    Morphine Sulfate (MORPHINE CONCENTRATE) 10 mg / 0.5 ml concentrated solution Take 0.25 mLs (5 mg total) by mouth every 4 (four) hours as needed for severe pain or shortness of breath. Qty: 30 mL, Refills: 0    oxyCODONE (OXY IR/ROXICODONE) 5 MG immediate release tablet Take 1 tablet (5 mg total) by mouth every 4 (  four) hours as needed for moderate pain (give if able to take pills orally easily). Qty: 30 tablet, Refills: 0    prochlorperazine (COMPAZINE) 25 MG suppository Place 1 suppository (25 mg total) rectally every 12 (twelve) hours as needed for nausea or vomiting. Qty: 6 suppository, Refills: 0    tamsulosin (FLOMAX) 0.4 MG CAPS capsule Take 1 capsule (0.4 mg total) by mouth daily after supper. Qty: 30 capsule, Refills: 0      CONTINUE these medications which have CHANGED    Details  allopurinol (ZYLOPRIM) 300 MG tablet Take 0.5 tablets (150 mg total) by mouth daily. Qty: 30 tablet, Refills: 0      CONTINUE these medications which have NOT CHANGED   Details  amiodarone (PACERONE) 200 MG tablet Take 200 mg by mouth daily. Refills: 1    aspirin EC 81 MG tablet Take 81 mg by mouth daily.    famotidine (PEPCID) 20 MG tablet Take 1 tablet (20 mg total) by mouth daily. Qty: 30 tablet, Refills: 0    levothyroxine (SYNTHROID, LEVOTHROID) 150 MCG tablet Take 150 mcg by mouth daily before breakfast. Refills: 1    midodrine (PROAMATINE) 5 MG tablet Take 1 tablet (5 mg total) by mouth 3 (three) times daily with meals. Qty: 90 tablet, Refills: 0    ondansetron (ZOFRAN) 4 MG tablet Take 4 mg by mouth daily as needed for nausea.    saccharomyces boulardii (FLORASTOR) 250 MG capsule Take 1 capsule (250 mg total) by mouth 2 (two) times daily. Qty: 28 capsule, Refills: 0    warfarin (COUMADIN) 2 MG tablet Take 1 tablet (2 mg total) by mouth daily at 6 PM. Qty: 20 tablet, Refills: 0      STOP taking these medications     Cholecalciferol (VITAMIN D3) 1000 UNITS CAPS      furosemide (LASIX) 40 MG tablet      simvastatin (ZOCOR) 40 MG tablet      traMADol (ULTRAM) 50 MG tablet      vancomycin (VANCOCIN) 50 mg/mL oral solution          DISCHARGE INSTRUCTIONS:    If you experience worsening of your admission symptoms, develop shortness of breath, life threatening emergency, suicidal or homicidal thoughts you must seek medical attention immediately by calling 911 or calling your MD immediately  if symptoms less severe.  You Must read complete instructions/literature along with all the possible adverse reactions/side effects for all the Medicines you take and that have been prescribed to you. Take any new Medicines after you have completely understood and accept all the possible adverse reactions/side effects.   Please note  You were cared for by a  hospitalist during your hospital stay. If you have any questions about your discharge medications or the care you received while you were in the hospital after you are discharged, you can call the unit and asked to speak with the hospitalist on call if the hospitalist that took care of you is not available. Once you are discharged, your primary care physician will handle any further medical issues. Please note that NO REFILLS for any discharge medications will be authorized once you are discharged, as it is imperative that you return to your primary care physician (or establish a relationship with a primary care physician if you do not have one) for your aftercare needs so that they can reassess your need for medications and monitor your lab values.    Today   SUBJECTIVE   No complaint.  VITAL SIGNS:  Blood pressure 96/46, pulse 61, temperature 97.9 F (36.6 C), temperature source Oral, resp. rate 18, height 5\' 9"  (1.753 m), weight 100.018 kg (220 lb 8 oz), SpO2 99 %.  I/O:   Intake/Output Summary (Last 24 hours) at 04/13/15 1409 Last data filed at 04/12/15 1710  Gross per 24 hour  Intake      0 ml  Output    100 ml  Net   -100 ml    PHYSICAL EXAMINATION:  GENERAL:  81 y.o.-year-old patient lying in the bed with no acute distress.  EYES: Pupils equal, round, reactive to light and accommodation. No scleral icterus. Extraocular muscles intact.  HEENT: Head atraumatic, normocephalic. Oropharynx and nasopharynx clear.  NECK:  Supple, no jugular venous distention. No thyroid enlargement, no tenderness.  LUNGS: Normal breath sounds bilaterally, no wheezing, rales,rhonchi or crepitation. No use of accessory muscles of respiration.  CARDIOVASCULAR: S1, S2 normal. No murmurs, rubs, or gallops.  ABDOMEN: Soft, non-tender, non-distended. Bowel sounds present. No organomegaly or mass.  EXTREMITIES: Bilateral lower extremity in wrap dressing underneath, no cyanosis, or clubbing.  NEUROLOGIC:  Cranial nerves II through XII are intact. Muscle strength 5/5 in all extremities. Sensation intact. Gait not checked.  PSYCHIATRIC: The patient is alert and oriented x 3.  SKIN: No obvious rash, lesion, or ulcer.   DATA REVIEW:   CBC  Recent Labs Lab 04/12/15 0442  WBC 6.9  HGB 12.0*  HCT 36.0*  PLT 152    Chemistries   Recent Labs Lab 04/08/15 0424  04/10/15 0539 04/12/15 0442  NA 135  < > 136 136  K 3.5  < > 3.4* 4.3  CL 107  < > 109 109  CO2 23  < > 21* 22  GLUCOSE 95  < > 107* 98  BUN 49*  < > 47* 42*  CREATININE 2.27*  < > 2.05* 1.78*  CALCIUM 8.9  < > 9.0 8.9  MG  --   < > 2.1  --   AST 55*  --   --   --   ALT 43  --   --   --   ALKPHOS 89  --   --   --   BILITOT 1.6*  --   --   --   < > = values in this interval not displayed.  Cardiac Enzymes No results for input(s): TROPONINI in the last 168 hours.  Microbiology Results  Results for orders placed or performed during the hospital encounter of 04/06/15  Urine culture     Status: None   Collection Time: 04/06/15 11:51 AM  Result Value Ref Range Status   Specimen Description URINE, CATHETERIZED  Final   Special Requests NONE  Final   Culture >=100,000 COLONIES/mL KLEBSIELLA OXYTOCA  Final   Report Status 04/08/2015 FINAL  Final   Organism ID, Bacteria KLEBSIELLA OXYTOCA  Final      Susceptibility   Klebsiella oxytoca - MIC*    AMPICILLIN >=32 RESISTANT Resistant     CEFTAZIDIME <=1 SENSITIVE Sensitive     CEFAZOLIN >=64 RESISTANT Resistant     CEFTRIAXONE <=1 SENSITIVE Sensitive     CIPROFLOXACIN <=0.25 SENSITIVE Sensitive     GENTAMICIN <=1 SENSITIVE Sensitive     IMIPENEM <=0.25 SENSITIVE Sensitive     TRIMETH/SULFA <=20 SENSITIVE Sensitive     PIP/TAZO Value in next row Sensitive      SENSITIVE<=4    * >=100,000 COLONIES/mL KLEBSIELLA OXYTOCA  C difficile quick scan w  PCR reflex     Status: None   Collection Time: 04/06/15  5:07 PM  Result Value Ref Range Status   C Diff antigen NEGATIVE  NEGATIVE Final   C Diff toxin NEGATIVE NEGATIVE Final   C Diff interpretation Negative for C. difficile  Final  MRSA PCR Screening     Status: None   Collection Time: 04/06/15  8:27 PM  Result Value Ref Range Status   MRSA by PCR NEGATIVE NEGATIVE Final    Comment:        The GeneXpert MRSA Assay (FDA approved for NASAL specimens only), is one component of a comprehensive MRSA colonization surveillance program. It is not intended to diagnose MRSA infection nor to guide or monitor treatment for MRSA infections.     RADIOLOGY:  No results found.      Management plans discussed with the patient, family and they are in agreement.  CODE STATUS:     Code Status Orders        Start     Ordered   04/09/15 1830  Do not attempt resuscitation (DNR)   Continuous    Question Answer Comment  In the event of cardiac or respiratory ARREST Do not call a "code blue"   In the event of cardiac or respiratory ARREST Do not perform Intubation, CPR, defibrillation or ACLS   In the event of cardiac or respiratory ARREST Use medication by any route, position, wound care, and other measures to relive pain and suffering. May use oxygen, suction and manual treatment of airway obstruction as needed for comfort.      04/09/15 1829      TOTAL TIME TAKING CARE OF THIS PATIENT: 35 minutes.    Shaune Pollack M.D on 04/13/2015 at 2:09 PM  Between 7am to 6pm - Pager - [phone]  After 6pm go to [email or web address] - password [email or web address] [facility]  [facility]  Office  [phone]  CC: Primary care physician; Sula Rumple, MD

## 2015-04-21 ENCOUNTER — Emergency Department
Admission: EM | Admit: 2015-04-21 | Discharge: 2015-04-21 | Disposition: A | Discharge status: Home / Self Care | Attending: Student | Admitting: Student

## 2015-04-21 ENCOUNTER — Encounter: Payer: Self-pay | Admitting: *Deleted

## 2015-04-21 ENCOUNTER — Emergency Department

## 2015-04-21 DIAGNOSIS — R601 Generalized edema: Secondary | ICD-10-CM | POA: Diagnosis not present

## 2015-04-21 DIAGNOSIS — R140 Abdominal distension (gaseous): Secondary | ICD-10-CM | POA: Diagnosis not present

## 2015-04-21 DIAGNOSIS — R0602 Shortness of breath: Secondary | ICD-10-CM | POA: Diagnosis not present

## 2015-04-21 DIAGNOSIS — Z7982 Long term (current) use of aspirin: Secondary | ICD-10-CM | POA: Diagnosis not present

## 2015-04-21 DIAGNOSIS — Z7902 Long term (current) use of antithrombotics/antiplatelets: Secondary | ICD-10-CM | POA: Insufficient documentation

## 2015-04-21 DIAGNOSIS — Z87891 Personal history of nicotine dependence: Secondary | ICD-10-CM | POA: Insufficient documentation

## 2015-04-21 DIAGNOSIS — R062 Wheezing: Secondary | ICD-10-CM | POA: Insufficient documentation

## 2015-04-21 DIAGNOSIS — R300 Dysuria: Secondary | ICD-10-CM | POA: Diagnosis not present

## 2015-04-21 DIAGNOSIS — R05 Cough: Secondary | ICD-10-CM | POA: Insufficient documentation

## 2015-04-21 DIAGNOSIS — Z79899 Other long term (current) drug therapy: Secondary | ICD-10-CM | POA: Diagnosis not present

## 2015-04-21 DIAGNOSIS — N5089 Other specified disorders of the male genital organs: Secondary | ICD-10-CM | POA: Diagnosis not present

## 2015-04-21 DIAGNOSIS — R2243 Localized swelling, mass and lump, lower limb, bilateral: Secondary | ICD-10-CM | POA: Diagnosis present

## 2015-04-21 LAB — CBC WITH DIFFERENTIAL/PLATELET
BASOS ABS: 0 10*3/uL (ref 0–0.1)
Basophils Relative: 0 %
Eosinophils Absolute: 0.2 10*3/uL (ref 0–0.7)
Eosinophils Relative: 3 %
HCT: 32.2 % — ABNORMAL LOW (ref 40.0–52.0)
HEMOGLOBIN: 10.6 g/dL — AB (ref 13.0–18.0)
LYMPHS ABS: 0.5 10*3/uL — AB (ref 1.0–3.6)
LYMPHS PCT: 9 %
MCH: 33.4 pg (ref 26.0–34.0)
MCHC: 32.9 g/dL (ref 32.0–36.0)
MCV: 101.4 fL — AB (ref 80.0–100.0)
Monocytes Absolute: 0.7 10*3/uL (ref 0.2–1.0)
Monocytes Relative: 12 %
NEUTROS ABS: 4.8 10*3/uL (ref 1.4–6.5)
NEUTROS PCT: 76 %
Platelets: 145 10*3/uL — ABNORMAL LOW (ref 150–440)
RBC: 3.17 MIL/uL — AB (ref 4.40–5.90)
RDW: 17 % — ABNORMAL HIGH (ref 11.5–14.5)
WBC: 6.2 10*3/uL (ref 3.8–10.6)

## 2015-04-21 LAB — COMPREHENSIVE METABOLIC PANEL
ALK PHOS: 139 U/L — AB (ref 38–126)
ALT: 69 U/L — AB (ref 17–63)
AST: 103 U/L — AB (ref 15–41)
Albumin: 2.1 g/dL — ABNORMAL LOW (ref 3.5–5.0)
Anion gap: 7 (ref 5–15)
BUN: 43 mg/dL — AB (ref 6–20)
CALCIUM: 8.9 mg/dL (ref 8.9–10.3)
CO2: 23 mmol/L (ref 22–32)
CREATININE: 1.87 mg/dL — AB (ref 0.61–1.24)
Chloride: 104 mmol/L (ref 101–111)
GFR, EST AFRICAN AMERICAN: 38 mL/min — AB (ref >60)
GFR, EST NON AFRICAN AMERICAN: 33 mL/min — AB (ref >60)
Glucose, Bld: 108 mg/dL — ABNORMAL HIGH (ref 65–99)
Potassium: 4.6 mmol/L (ref 3.5–5.1)
Sodium: 134 mmol/L — ABNORMAL LOW (ref 135–145)
Total Bilirubin: 0.9 mg/dL (ref 0.3–1.2)
Total Protein: 4.9 g/dL — ABNORMAL LOW (ref 6.5–8.1)

## 2015-04-21 LAB — URINALYSIS COMPLETE WITH MICROSCOPIC (ARMC ONLY)
BILIRUBIN URINE: NEGATIVE
Bacteria, UA: NONE SEEN
Glucose, UA: NEGATIVE mg/dL
HGB URINE DIPSTICK: NEGATIVE
KETONES UR: NEGATIVE mg/dL
Leukocytes, UA: NEGATIVE
Nitrite: NEGATIVE
PH: 5 (ref 5.0–8.0)
Protein, ur: NEGATIVE mg/dL
SPECIFIC GRAVITY, URINE: 1.015 (ref 1.005–1.030)
SQUAMOUS EPITHELIAL / LPF: NONE SEEN

## 2015-04-21 LAB — LIPASE, BLOOD: LIPASE: 25 U/L (ref 11–51)

## 2015-04-21 MED ORDER — LIDOCAINE HCL 2 % EX GEL
CUTANEOUS | Status: AC
  Administered 2015-04-21: 1 via URETHRAL
  Filled 2015-04-21: qty 10

## 2015-04-21 MED ORDER — LIDOCAINE HCL 2 % EX GEL
1.0000 "application " | Freq: Once | CUTANEOUS | Status: AC
  Administered 2015-04-21: 1 via URETHRAL

## 2015-04-21 NOTE — ED Notes (Signed)
Ambulance called for transport. Patient's wife remains at bedside. Left arm wrapped with ABD pads and kling for weeping edema.

## 2015-04-21 NOTE — ED Notes (Addendum)
Per EMS report, patient is a home Hospice patient and was sent here by Hospice for Foley catheter placement due to increased scrotal and penile edema. Patient has generalized edema, weeping edema on left forearm, penis and scrotum are edematous. Una boots are in place. Family states patient's doctor wants him to come in for a paracentesis. Patient states he takes Lactulose 3xdaily and hasn't had a BM in 4 days.

## 2015-04-21 NOTE — ED Notes (Signed)
[facility] present for transport.

## 2015-04-21 NOTE — ED Notes (Signed)
Patient changed into clean gown, given clean linens and water to drink. Waiting for ambulance transport.

## 2015-04-21 NOTE — ED Provider Notes (Signed)
[facility] Emergency Department Provider Note  ____________________________________________  Time seen: Approximately 12:04 PM  I have reviewed the triage vital signs and the nursing notes.   HISTORY  Chief Complaint Groin Swelling    HPI [name] is a 81 y.o. 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 Patient is on hospice. Family has noted increased swelling to his abdomen, scrotal area/groin, legs. He has also had cough with wheeze. No chest pain, he reports discomfort due to abdominal swelling but denies any frank pain. No fevers or chills. He was sent by hospice for evaluation.   Past Medical History  Diagnosis Date  . CHF (congestive heart failure) (HCC)   . BPH (benign prostatic hyperplasia)   . A-fib (HCC)   . Thyroid disease   . High cholesterol   . Bilateral lower extremity edema   . CAD (coronary artery disease)   . Cirrhosis (HCC)   . CVA (cerebral vascular accident) (HCC)   . Gout   . Hypothyroidism   . Myocardial infarction (HCC)   . Obesity   . Dysrhythmia   . History of chickenpox   . ED (erectile dysfunction)     Patient Active Problem List   Diagnosis Date Noted  . Hepatic encephalopathy (HCC) 04/06/2015  . Diarrhea of presumed infectious origin   . C. difficile colitis   . Recurrent colitis due to Clostridium difficile 03/13/2015  . Acute kidney injury (HCC) 01/16/2015  . Abdominal pain 01/10/2015  . Diarrhea 01/10/2015    Past Surgical History  Procedure Laterality Date  . Vein bypass surgery    . Vascular surgery    . Cardiac surgery      quad bypass  . Esophagogastroduodenoscopy (egd) with propofol [name] 03/04/2015    Procedure: ESOPHAGOGASTRODUODENOSCOPY (EGD) WITH PROPOFOL;  Surgeon: Elnita Maxwell, MD;  Location: [facility] ENDOSCOPY;  Service: Endoscopy;  Laterality: [name];  . Coronary artery bypass graft    . Fecal transplant [name] 03/29/2015    Procedure: FECAL TRANSPLANT;  Surgeon: Scot Jun, MD;  Location: [facility] ENDOSCOPY;  Service: Endoscopy;  Laterality: [name];    Current Outpatient Rx  Name  Route  Sig  Dispense  Refill  . albuterol (PROVENTIL) (2.5 MG/3ML) 0.083% nebulizer solution   Nebulization   Take 3 mLs (2.5 mg total) by nebulization every 4 (four) hours as needed for wheezing or shortness of breath.   75 mL   12   . allopurinol (ZYLOPRIM) 300 MG tablet   Oral   Take 0.5 tablets (150 mg total) by mouth daily.   30 tablet   0   . amiodarone (PACERONE) 200 MG tablet   Oral   Take 200 mg by mouth daily.      1   . aspirin EC 81 MG tablet   Oral   Take 81 mg by mouth daily.         . famotidine (PEPCID) 20 MG tablet   Oral   Take 1 tablet (20 mg total) by mouth daily.   30 tablet   0   . glycopyrrolate (ROBINUL) 1 MG tablet   Oral   Take 1 tablet (1 mg total) by mouth 3 (three) times daily as needed (prn excessive secretions).   15 tablet   0   . lactulose (CHRONULAC) 10 GM/15ML solution  Oral   Take 45 mLs (30 g total) by mouth 2 (two) times daily.   236 mL   0   . levothyroxine (SYNTHROID, LEVOTHROID) 150 MCG tablet   Oral   Take 150 mcg by mouth daily before breakfast.      1   . LORazepam (ATIVAN) 0.5 MG tablet   Oral   Take 1 tablet (0.5 mg total) by mouth every 4 (four) hours as needed for anxiety.   15 tablet   0   . midodrine (PROAMATINE) 5 MG tablet   Oral   Take 1 tablet (5 mg total) by mouth 3 (three) times daily with meals.   90 tablet   0   . Morphine Sulfate (MORPHINE CONCENTRATE) 10 mg / 0.5 ml concentrated solution   Oral   Take 0.25 mLs (5 mg total) by mouth every 4 (four) hours as needed for severe pain or shortness of breath.   30 mL   0   . ondansetron (ZOFRAN) 4 MG tablet   Oral   Take 4 mg by mouth  daily as needed for nausea.         Marland Kitchen. oxyCODONE (OXY IR/ROXICODONE) 5 MG immediate release tablet   Oral   Take 1 tablet (5 mg total) by mouth every 4 (four) hours as needed for moderate pain (give if able to take pills orally easily).   30 tablet   0   . prochlorperazine (COMPAZINE) 25 MG suppository   Rectal   Place 1 suppository (25 mg total) rectally every 12 (twelve) hours as needed for nausea or vomiting.   6 suppository   0   . saccharomyces boulardii (FLORASTOR) 250 MG capsule   Oral   Take 1 capsule (250 mg total) by mouth 2 (two) times daily.   28 capsule   0   . tamsulosin (FLOMAX) 0.4 MG CAPS capsule   Oral   Take 1 capsule (0.4 mg total) by mouth daily after supper.   30 capsule   0   . warfarin (COUMADIN) 2 MG tablet   Oral   Take 1 tablet (2 mg total) by mouth daily at 6 PM.   20 tablet   0     Allergies Review of patient's allergies indicates no known allergies.  Family History  Problem Relation Age of Onset  . Diabetes Mellitus II Mother   . Lung cancer Brother     Social History Social History  Substance Use Topics  . Smoking status: Former Smoker    Types: Cigarettes    Quit date: 03/26/1979  . Smokeless tobacco: Never Used  . Alcohol Use: No    Review of Systems Constitutional: No fever/chills Eyes: No visual changes. ENT: No sore throat. Cardiovascular: Denies chest pain. Respiratory: + shortness of breath. Gastrointestinal: + abdominal swelling.  No nausea, no vomiting.  No diarrhea.  No constipation. Genitourinary: Positive for dysuria due to penile swelling/irritation. Musculoskeletal: Negative for back pain. Skin: Negative for rash. Neurological: Negative for headaches, focal weakness or numbness.  10-point ROS otherwise negative.  ____________________________________________   PHYSICAL EXAM:  VITAL SIGNS: ED Triage Vitals  Enc Vitals Group     BP 04/21/15 1159 105/54 mmHg     Pulse Rate 04/21/15 1159 65     Resp  04/21/15 1159 22     Temp 04/21/15 1159 97.8 F (36.6 C)     Temp Source 04/21/15 1159 Oral     SpO2 04/21/15 1159 97 %  Weight 04/21/15 1159 210 lb (95.255 kg)     Height 04/21/15 1159  (1.727 m)     Head Cir --      Peak Flow --      Pain Score --      Pain Loc --      Pain Edu? --      Excl. in GC? --     Constitutional: Alert and oriented. Chronically ill-appearing, appears fatigued. Eyes: Conjunctivae are normal. PERRL. EOMI. Head: Atraumatic. Nose: No congestion/rhinnorhea. Mouth/Throat: Mucous membranes are moist.  Oropharynx non-erythematous. Neck: No stridor.   Cardiovascular: Normal rate, regular rhythm. Grossly normal heart sounds.  Good peripheral circulation. Respiratory: Mild tachypnea, crackles at the bases with expiratory wheeze. Gastrointestinal: Severe diffuse abdominal distention without tenderness. Normal bowel sounds. Genitourinary: Severe penile and scrotal edema Musculoskeletal: One to 2+ pitting edema bilateral extremities which are wrapped with compression dressing. Neurologic:  Normal speech and language. No gross focal neurologic deficits are appreciated.  Skin:  Skin is warm, dry and intact. No rash noted. Psychiatric: Mood and affect are normal. Speech and behavior are normal.  ____________________________________________   LABS (all labs ordered are listed, but only abnormal results are displayed)  Labs Reviewed  CBC WITH DIFFERENTIAL/PLATELET - Abnormal; Notable for the following:    RBC 3.17 (*)    Hemoglobin 10.6 (*)    HCT 32.2 (*)    MCV 101.4 (*)    RDW 17.0 (*)    Platelets 145 (*)    Lymphs Abs 0.5 (*)    All other components within normal limits  COMPREHENSIVE METABOLIC PANEL - Abnormal; Notable for the following:    Sodium 134 (*)    Glucose, Bld 108 (*)    BUN 43 (*)    Creatinine, Ser 1.87 (*)    Total Protein 4.9 (*)    Albumin 2.1 (*)    AST 103 (*)    ALT 69 (*)    Alkaline Phosphatase 139 (*)    GFR calc non  Af Amer 33 (*)    GFR calc Af Amer 38 (*)    All other components within normal limits  URINALYSIS COMPLETEWITH MICROSCOPIC ([facility] ONLY) - Abnormal; Notable for the following:    Color, Urine YELLOW (*)    APPearance CLEAR (*)    All other components within normal limits  LIPASE, BLOOD   ____________________________________________  EKG  ED ECG REPORT I, Gayla Doss, the attending physician, personally viewed and interpreted this ECG.   Date: 04/21/2015  EKG Time: 12:40  Rate: 63  Rhythm:  unchanged from previous tracings, likely junctional given rate and regularity doubt heart block  Axis: normal  Intervals:none  ST&T Change: No acute ST elevation. Low voltage.  ____________________________________________  RADIOLOGY  CXR IMPRESSION: Status post CABG. Persistent hazy left basilar atelectasis or infiltrate. No pulmonary edema. ____________________________________________   PROCEDURES  Procedure(s) performed: None  Critical Care performed: No  ____________________________________________   INITIAL IMPRESSION / ASSESSMENT AND PLAN / ED COURSE  Pertinent labs & imaging results that were available during my care of the patient were reviewed by me and considered in my medical decision making (see chart for details).  Blake White is a 81 y.o. 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 On exam, he appears chronically ill, uncomfortable with diffuse anasarca worse throughout the abdomen, groin region as well  as the penis. Request was made by hospice nurse to place Foley however this is been difficult given severe penile edema. We'll obtain basic labs, chest x-ray, I discussed the case with Dr. Loney Laurence of palliative care who will evaluate the patient.  ----------------------------------------- 2:38 PM on  04/21/2015 ----------------------------------------- Labs reviewed and are notable for chronic abnormalities which are mostly unchanged when compared to the patient's most recent discharge approximately one week ago. Chest x-ray shows chronic findings, unchanged from prior. She has no oxygen requirement. Foley was successfully placed. Dr. Orvan Falconer has evaluated the patient discussed the patient's care with his family at bedside. She recommends discharge and hospice will manage his diuresis and assess for need for outpatient paracentesis. Of note, he had an ultrasound during his most recent admission that showed only  small volume ascites. Wife and son are comfortable with the discharge plan. Will DC home.  ____________________________________________   FINAL CLINICAL IMPRESSION(S) / ED DIAGNOSES  Final diagnoses:  Anasarca      Gayla Doss, MD 04/21/15 1450

## 2015-04-21 NOTE — Progress Notes (Signed)
ED visit made. Notified by patient's hospice RN that he was coming to the ED to for foley catheter placement. She was unable to place in the home to the amount of swelling to his scrotum and penis. Writer spoke with patient's wife Blake White outside of the room with Dr. Orvan Falconerampbell, Palliative Medicine physician. There was some confusion regarding why Blake White was in the ED. There was communication by the family that the patient was there for paracentesis. When Clinical research associatewriter spoke with Blake White along with Dr. Orvan Falconerampbell, the goal was clarified to be just cathter placement and to return home to continue hospice services. Per chart note review a 14 french coude catheter was able to be placed. Hospice team updated. Thank you. Dayna BarkerKaren Robertson RN, BSN, [facility] [place], hospital Liaison [phone]

## 2015-04-22 DIAGNOSIS — R601 Generalized edema: Secondary | ICD-10-CM | POA: Insufficient documentation

## 2015-04-22 NOTE — Consult Note (Signed)
Palliative Medicine Inpatient Consult Note   Name: Blake White Date: 04/22/2015 MRN: [id number]  DOB: 04/05/1934  Referring Physician: No att. providers found  Palliative Care consult requested for this 81 y.o. male for goals of medical therapy in patient with end stage cirrhosis (NASH) and anasarca.     TODAY'S DISCUSSIONS AND DECISIONS Pt is well known to me from previous hospital stay.  Apparently, his urine output had decreased and Hospice nurses were unable to place a Foley catheter.  They advised family to have him sent to the ER for placement of Foley.  This was communicated to ER staff by Hospice.  HOWEVER, somehow, there was communication from family to ER that pt was also here to 'possibly' get fluid drawn off of his abdomen.  There were mixed messages about the purpose of patient's presence in the ER and I was asked to see if I could talk to the patient's wife and clarify things.    I also spoke with Hospice Liaison who joined in the conversation with the patient's wife.    It seems that the patient's son had thought that with this ER visit, doctor, he might be able to talk to the GI physician, Dr Mechele CollinElliott, and he might be able to get a paracentesis worked into the ER visit which had been recommended by Hospice staff.  This was eventually figured out after much discussion.    Pt's last ultrasound showed only small ascites.  He would have required hospital admission if he was going to get a paracentesis on this date.  That did not appear to be urgent. They foley placement was urgent (due to penile edema).  The plan for discharging pt home and discussing possible paracentesis to be done as an outpt was then set in place.   He was discharged in stable condition with a foley in place.  Hospice Liaison was in communication with other Hospice staff about the ER visit and the plan.  IMPRESSION Anasarca Cirrhosis (NASH) CHF in past Atrial Fibrillation CAD Dependent edema H/O hepatic  encephalopathy     REVIEW OF SYSTEMS:  Patient is not able to provide ROS due to confusion   SPIRITUAL SUPPORT SYSTEM: Yes.  SOCIAL HISTORY:  reports that he quit smoking about 36 years ago. His smoking use included Cigarettes. He has never used smokeless tobacco. He reports that he does not drink alcohol or use illicit drugs.  [facility] seeing pt at home.  Married. Son and other family very involved.    LEGAL DOCUMENTS:  None seen with pt  CODE STATUS: DNR  PAST MEDICAL HISTORY: Past Medical History  Diagnosis Date  . CHF (congestive heart failure) (HCC)   . BPH (benign prostatic hyperplasia)   . A-fib (HCC)   . Thyroid disease   . High cholesterol   . Bilateral lower extremity edema   . CAD (coronary artery disease)   . Cirrhosis (HCC)   . CVA (cerebral vascular accident) (HCC)   . Gout   . Hypothyroidism   . Myocardial infarction (HCC)   . Obesity   . Dysrhythmia   . History of chickenpox   . ED (erectile dysfunction)     PAST SURGICAL HISTORY:  Past Surgical History  Procedure Laterality Date  . Vein bypass surgery    . Vascular surgery    . Cardiac surgery      quad bypass  . Esophagogastroduodenoscopy (egd) with propofol [name] 03/04/2015    Procedure: ESOPHAGOGASTRODUODENOSCOPY (  EGD) WITH PROPOFOL;  Surgeon: Elnita Maxwell, MD;  Location: [facility] ENDOSCOPY;  Service: Endoscopy;  Laterality: [name];  . Coronary artery bypass graft    . Fecal transplant [name] 03/29/2015    Procedure: FECAL TRANSPLANT;  Surgeon: Scot Jun, MD;  Location: [facility] ENDOSCOPY;  Service: Endoscopy;  Laterality: [name];    ALLERGIES:  has No Known Allergies.  MEDICATIONS:  No current facility-administered medications for this encounter.   Current Outpatient Prescriptions  Medication Sig Dispense Refill  . albuterol (PROVENTIL) (2.5 MG/3ML) 0.083% nebulizer solution Take 3 mLs (2.5 mg total) by nebulization every 4 (four) hours as needed for wheezing or shortness  of breath. 75 mL 12  . allopurinol (ZYLOPRIM) 300 MG tablet Take 0.5 tablets (150 mg total) by mouth daily. 30 tablet 0  . amiodarone (PACERONE) 200 MG tablet Take 200 mg by mouth daily.  1  . aspirin EC 81 MG tablet Take 81 mg by mouth daily.    . famotidine (PEPCID) 20 MG tablet Take 1 tablet (20 mg total) by mouth daily. 30 tablet 0  . glycopyrrolate (ROBINUL) 1 MG tablet Take 1 tablet (1 mg total) by mouth 3 (three) times daily as needed (prn excessive secretions). 15 tablet 0  . lactulose (CHRONULAC) 10 GM/15ML solution Take 45 mLs (30 g total) by mouth 2 (two) times daily. 236 mL 0  . levothyroxine (SYNTHROID, LEVOTHROID) 150 MCG tablet Take 150 mcg by mouth daily before breakfast.  1  . LORazepam (ATIVAN) 0.5 MG tablet Take 1 tablet (0.5 mg total) by mouth every 4 (four) hours as needed for anxiety. 15 tablet 0  . midodrine (PROAMATINE) 5 MG tablet Take 1 tablet (5 mg total) by mouth 3 (three) times daily with meals. 90 tablet 0  . Morphine Sulfate (MORPHINE CONCENTRATE) 10 mg / 0.5 ml concentrated solution Take 0.25 mLs (5 mg total) by mouth every 4 (four) hours as needed for severe pain or shortness of breath. 30 mL 0  . ondansetron (ZOFRAN) 4 MG tablet Take 4 mg by mouth daily as needed for nausea.    Marland Kitchen oxyCODONE (OXY IR/ROXICODONE) 5 MG immediate release tablet Take 1 tablet (5 mg total) by mouth every 4 (four) hours as needed for moderate pain (give if able to take pills orally easily). 30 tablet 0  . prochlorperazine (COMPAZINE) 25 MG suppository Place 1 suppository (25 mg total) rectally every 12 (twelve) hours as needed for nausea or vomiting. 6 suppository 0  . saccharomyces boulardii (FLORASTOR) 250 MG capsule Take 1 capsule (250 mg total) by mouth 2 (two) times daily. 28 capsule 0  . tamsulosin (FLOMAX) 0.4 MG CAPS capsule Take 1 capsule (0.4 mg total) by mouth daily after supper. 30 capsule 0  . warfarin (COUMADIN) 2 MG tablet Take 1 tablet (2 mg total) by mouth daily at 6 PM. 20  tablet 0    Vital Signs: BP 100/62 mmHg  Pulse 58  Temp(Src) 97.8 F (36.6 C) (Oral)  Resp 20  Ht  (1.727 m)  Wt 95.255 kg (210 lb)  BMI 31.94 kg/m2  SpO2 98% Filed Weights   04/21/15 1159  Weight: 95.255 kg (210 lb)    Estimated body mass index is 31.94 kg/(m^2) as calculated from the following:   Height as of this encounter:  (1.727 m).   Weight as of this encounter: 95.255 kg (210 lb).  PERFORMANCE STATUS (ECOG) : 3 - Symptomatic, >50% confined to bed  PHYSICAL EXAM: Alert but quite swollen with  anasarca Getting foley placed in ER right now EOMI OP clear Neck no TM no JVD seen Hrt rrr no m Lungs cta ant ABd is fuill with fluid wave and nl BS Ext lower legs are dressed with Neomia Dear Boots   LABS: CBC:    Component Value Date/Time   WBC 6.2 04/21/2015 1240   WBC 5.4 01/22/2012 0955   HGB 10.6* 04/21/2015 1240   HGB 14.5 01/22/2012 0955   HCT 32.2* 04/21/2015 1240   HCT 44.7 01/22/2012 0955   PLT 145* 04/21/2015 1240   PLT 101* 01/22/2012 0955   MCV 101.4* 04/21/2015 1240   MCV 103* 01/22/2012 0955   NEUTROABS 4.8 04/21/2015 1240   NEUTROABS 3.0 01/22/2012 0955   LYMPHSABS 0.5* 04/21/2015 1240   LYMPHSABS 1.6 01/22/2012 0955   MONOABS 0.7 04/21/2015 1240   MONOABS 0.6 01/22/2012 0955   EOSABS 0.2 04/21/2015 1240   EOSABS 0.1 01/22/2012 0955   BASOSABS 0.0 04/21/2015 1240   BASOSABS 0.1 01/22/2012 0955   Comprehensive Metabolic Panel:    Component Value Date/Time   NA 134* 04/21/2015 1240   K 4.6 04/21/2015 1240   CL 104 04/21/2015 1240   CO2 23 04/21/2015 1240   BUN 43* 04/21/2015 1240   CREATININE 1.87* 04/21/2015 1240   CREATININE 2.92* 01/22/2012 0955   GLUCOSE 108* 04/21/2015 1240   CALCIUM 8.9 04/21/2015 1240   AST 103* 04/21/2015 1240   ALT 69* 04/21/2015 1240   ALKPHOS 139* 04/21/2015 1240   BILITOT 0.9 04/21/2015 1240   PROT 4.9* 04/21/2015 1240   ALBUMIN 2.1* 04/21/2015 1240   ALBUMIN 3.9 07/11/2011 1045      More than  50% of the visit was spent in counseling/coordination of care: Yes  Time Spent:  45 minutes

## 2015-05-09 DEATH — deceased

## 2016-07-17 IMAGING — US US ABDOMEN COMPLETE
1 series · 14 of 25 positions shown · non-contrast
Comparison: 01/11/2015 similar exam

CLINICAL DATA: Abdominal distention and pain

EXAM:
ULTRASOUND ABDOMEN COMPLETE

[Series 1: us abdomen complete · 0.28mm/px · 14 of 102 slices shown]
[im 1/102]
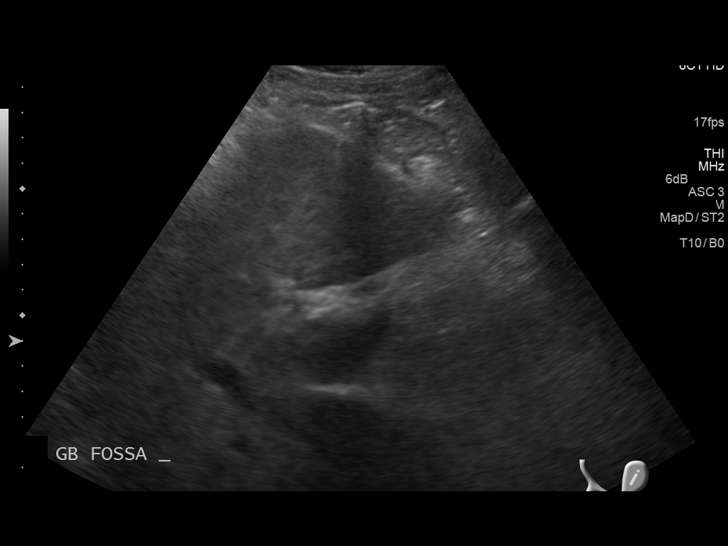
[im 9/102]
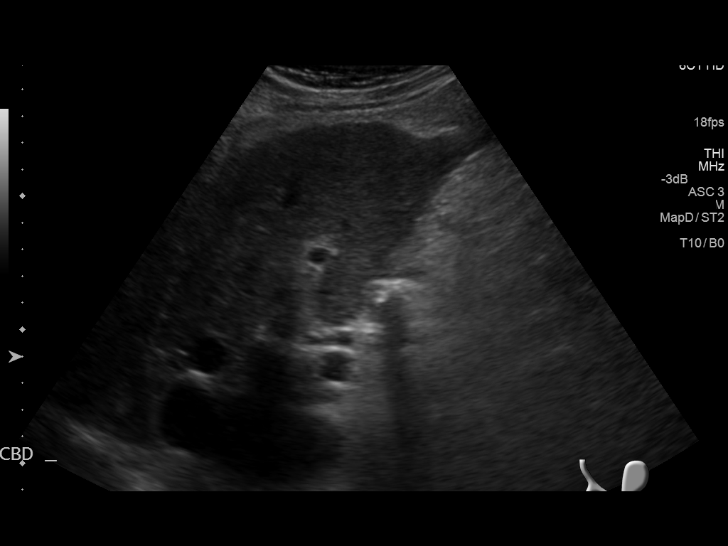
[im 17/102]
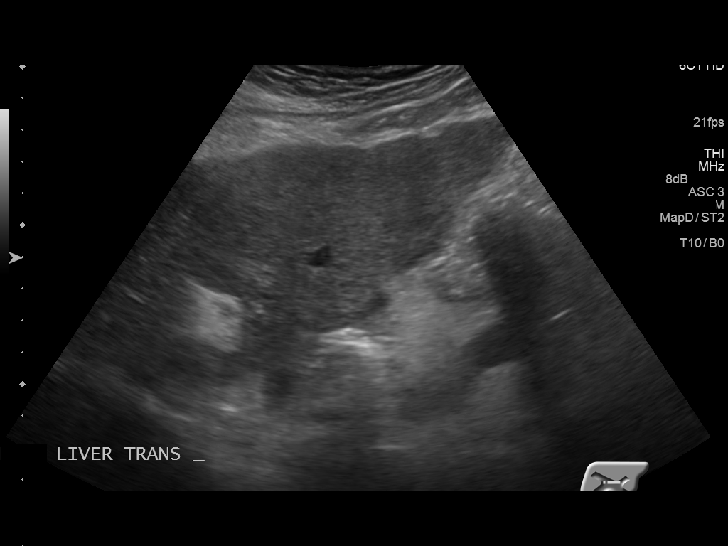
[im 26/102]
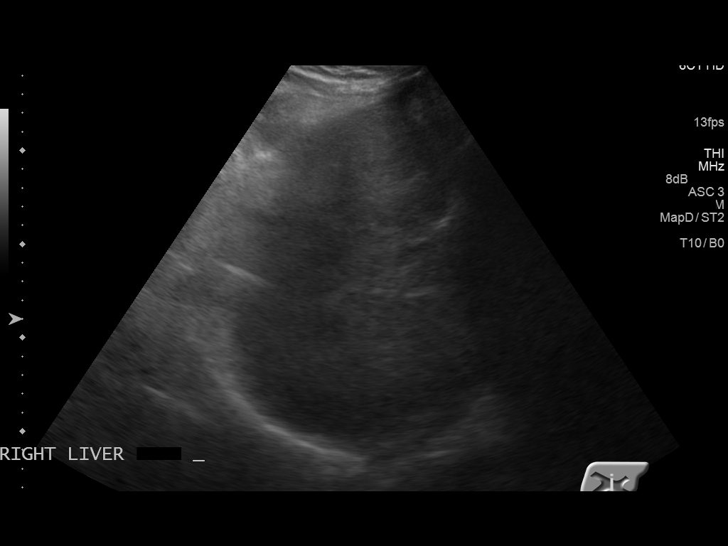
[im 34/102]
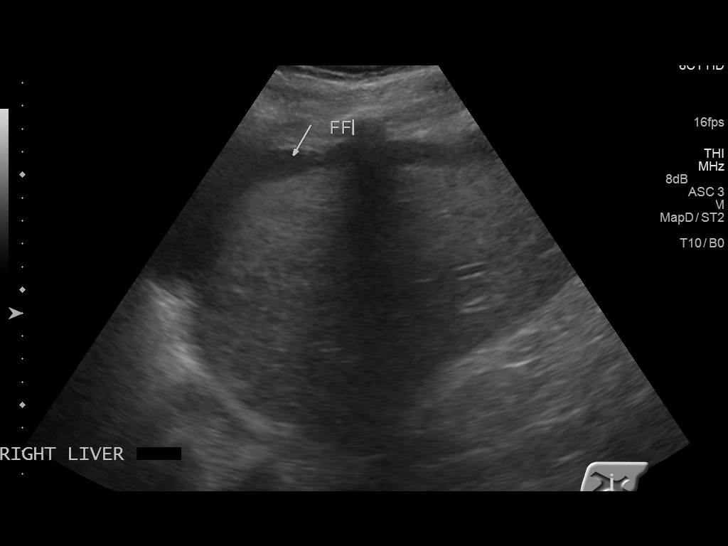
[im 38/102]
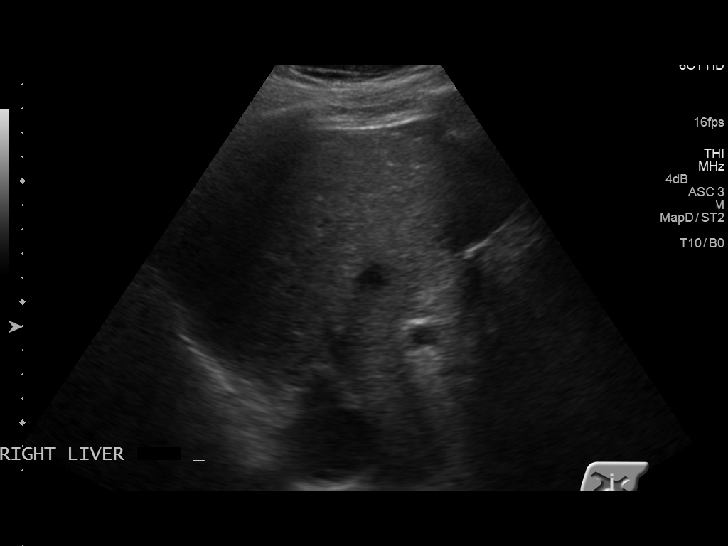
[im 47/102]
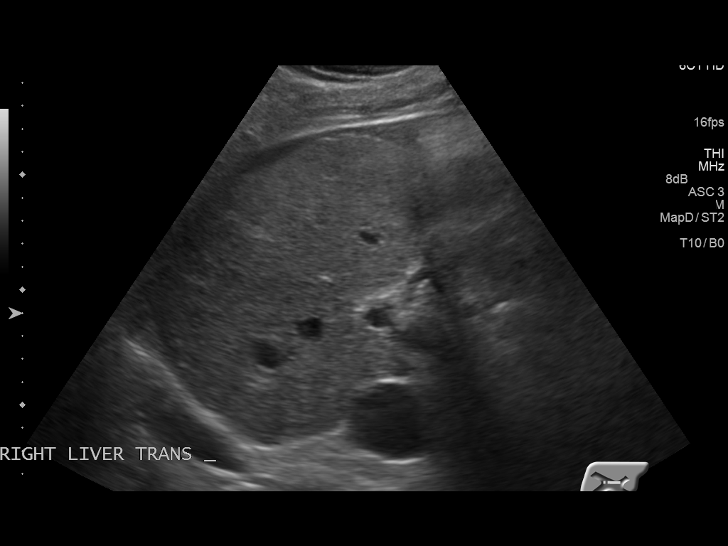
[im 55/102]
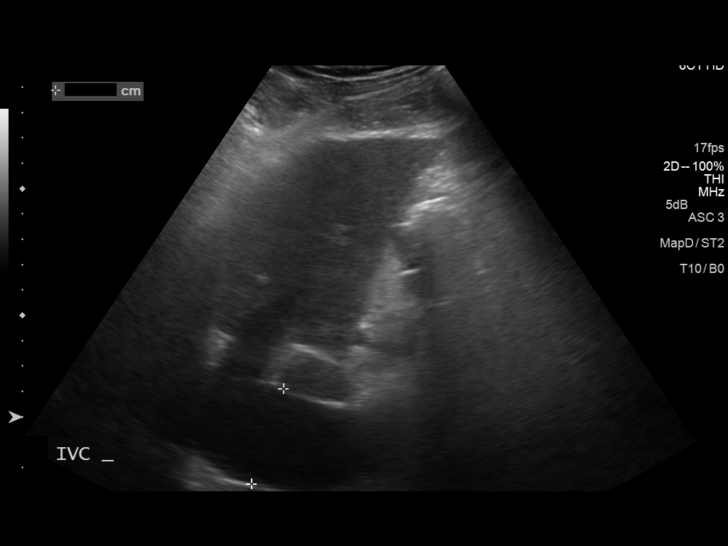
[im 64/102]
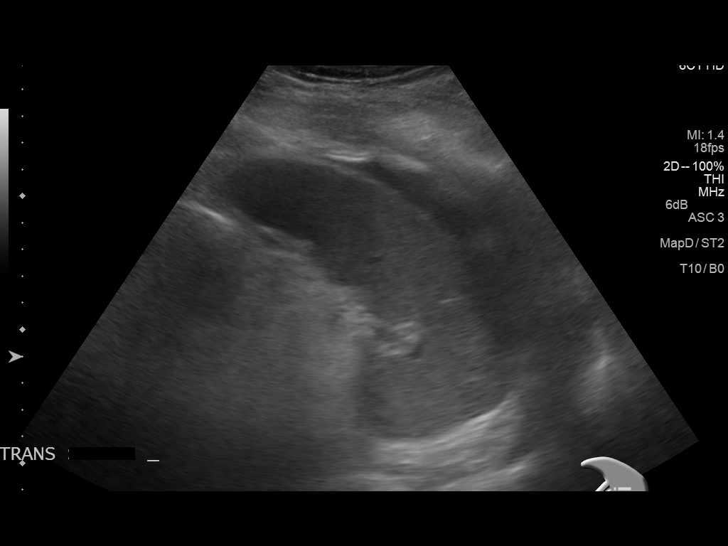
[im 68/102]
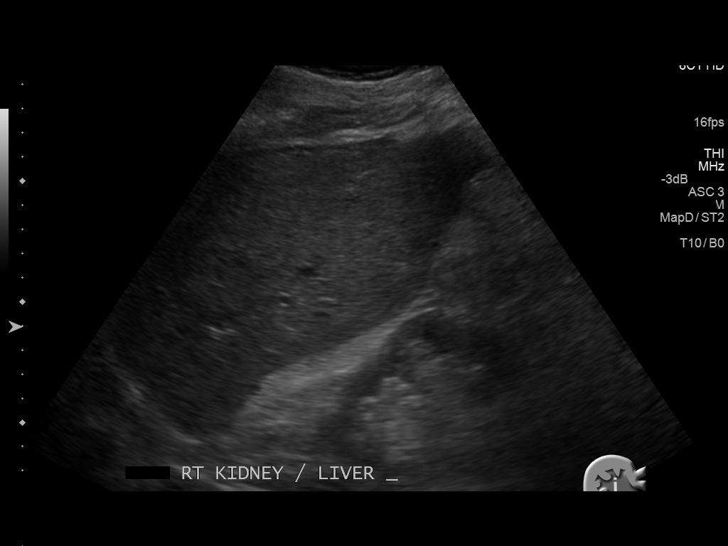
[im 76/102]
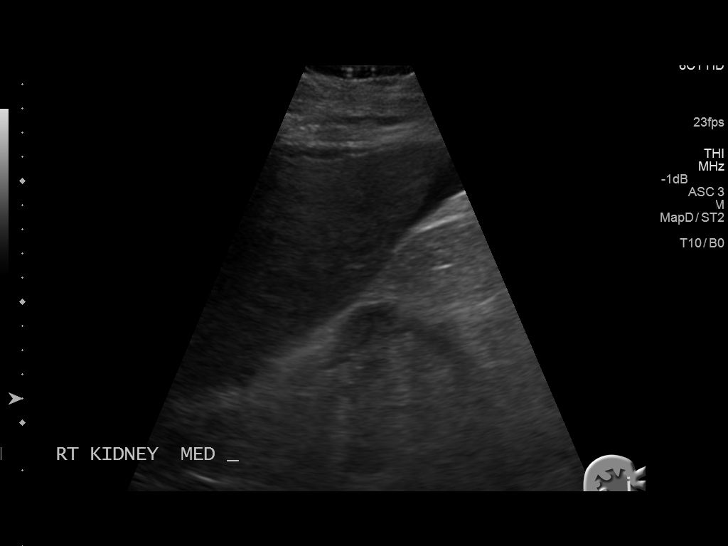
[im 85/102]
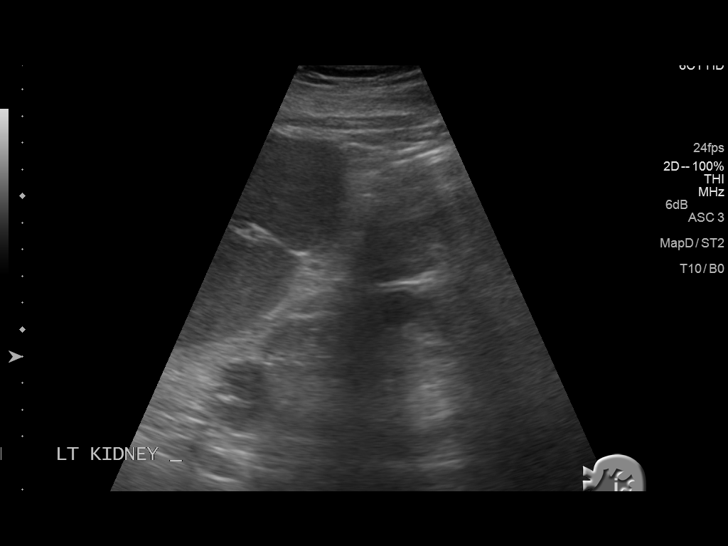
[im 93/102]
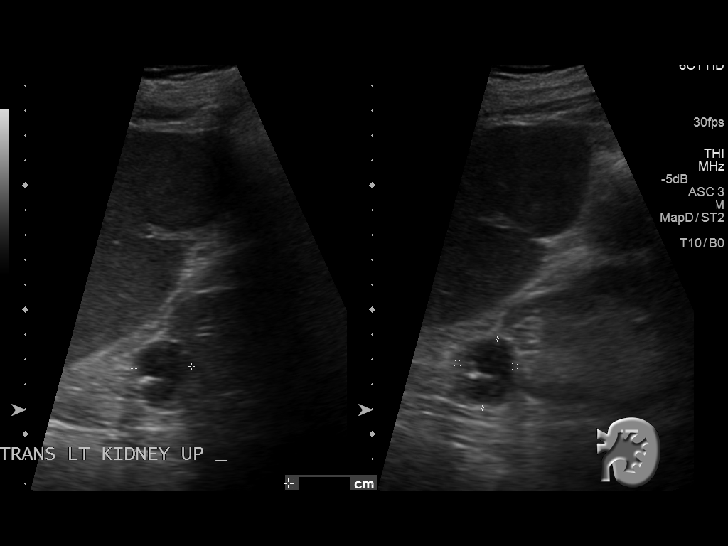
[im 102/102]
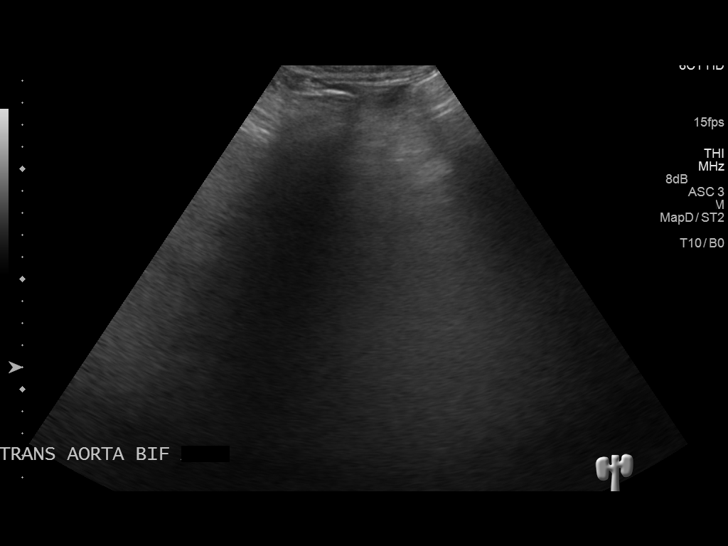

[14 of 25 positions shown; findings below may reference images not displayed]

FINDINGS: Gallbladder: Surgically absent

Common bile duct: Diameter: 4 mm

Liver: Nodular contour with increased in echogenicity suggesting
cirrhosis as seen previously. No focal abnormality or intrahepatic
ductal dilatation. Perihepatic ascites is present.

IVC: No abnormality visualized.

Pancreas: Partly obscured by bowel gas, normal in visualized
portions.

Spleen: Size and appearance within normal limits.

Right Kidney: Length: 8.8 cm. Echogenicity within normal limits. No
mass or hydronephrosis visualized.

Left Kidney: Length: 8.9 cm, with 2 closely opposed cysts left upper
pole reidentified measuring 2.8 x 2.3 x 2.3 cm.. Echogenicity within
normal limits. No mass or hydronephrosis visualized.

Mildly lobulated renal contour bilaterally.

Abdominal aorta: No aneurysm visualized in the proximal aorta. Aorta
otherwise obscured by bowel gas.

Other findings: Pleural effusions and ascites
IMPRESSION: Stable appearance of nodular hepatic contour and increased
echogenicity suggesting cirrhosis.

Ascites and pleural effusions.

## 2016-10-23 IMAGING — CR DG CHEST 1V PORT
1 series · 2 of 2 positions shown · non-contrast
Comparison: 03/13/2015

CLINICAL DATA: Shortness of breath with history of kidney failure.

EXAM:
PORTABLE CHEST 1 VIEW

[Series 1: ap · 0.17mm/px · 2 of 2 slices shown]
[im 1/2]
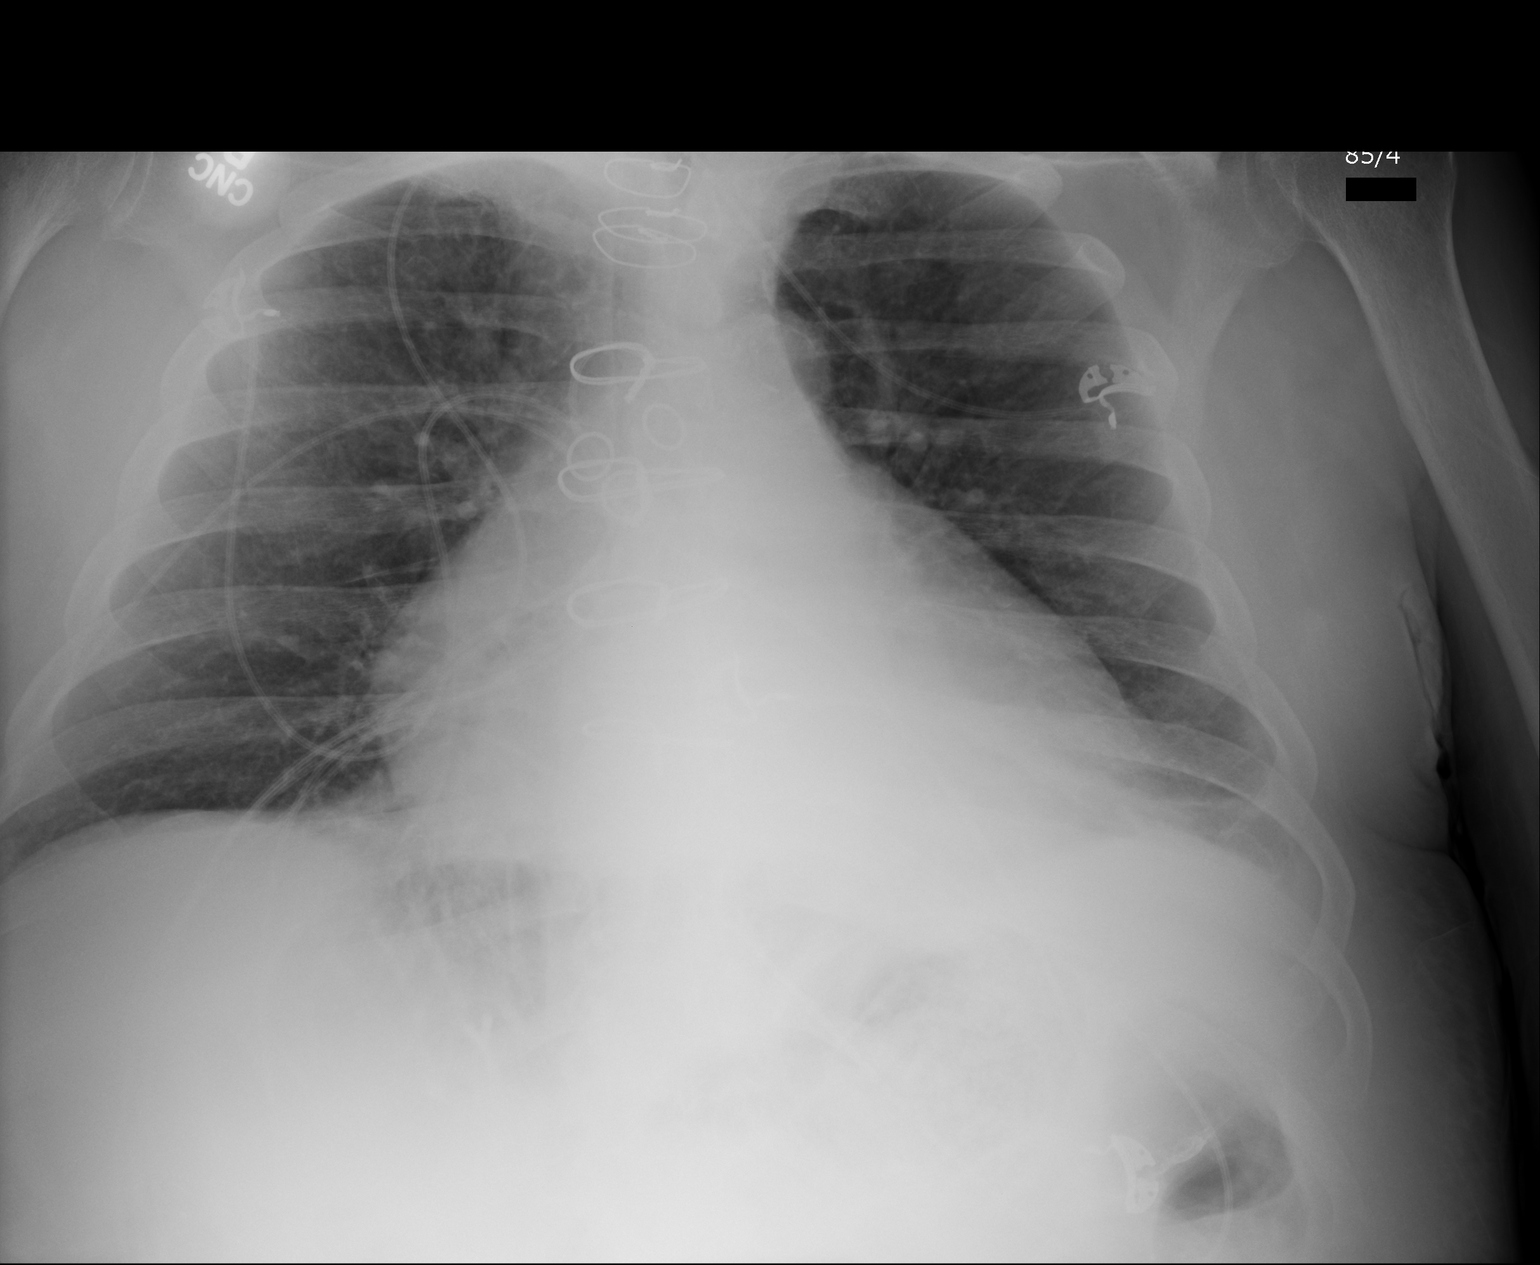
[im 2/2]
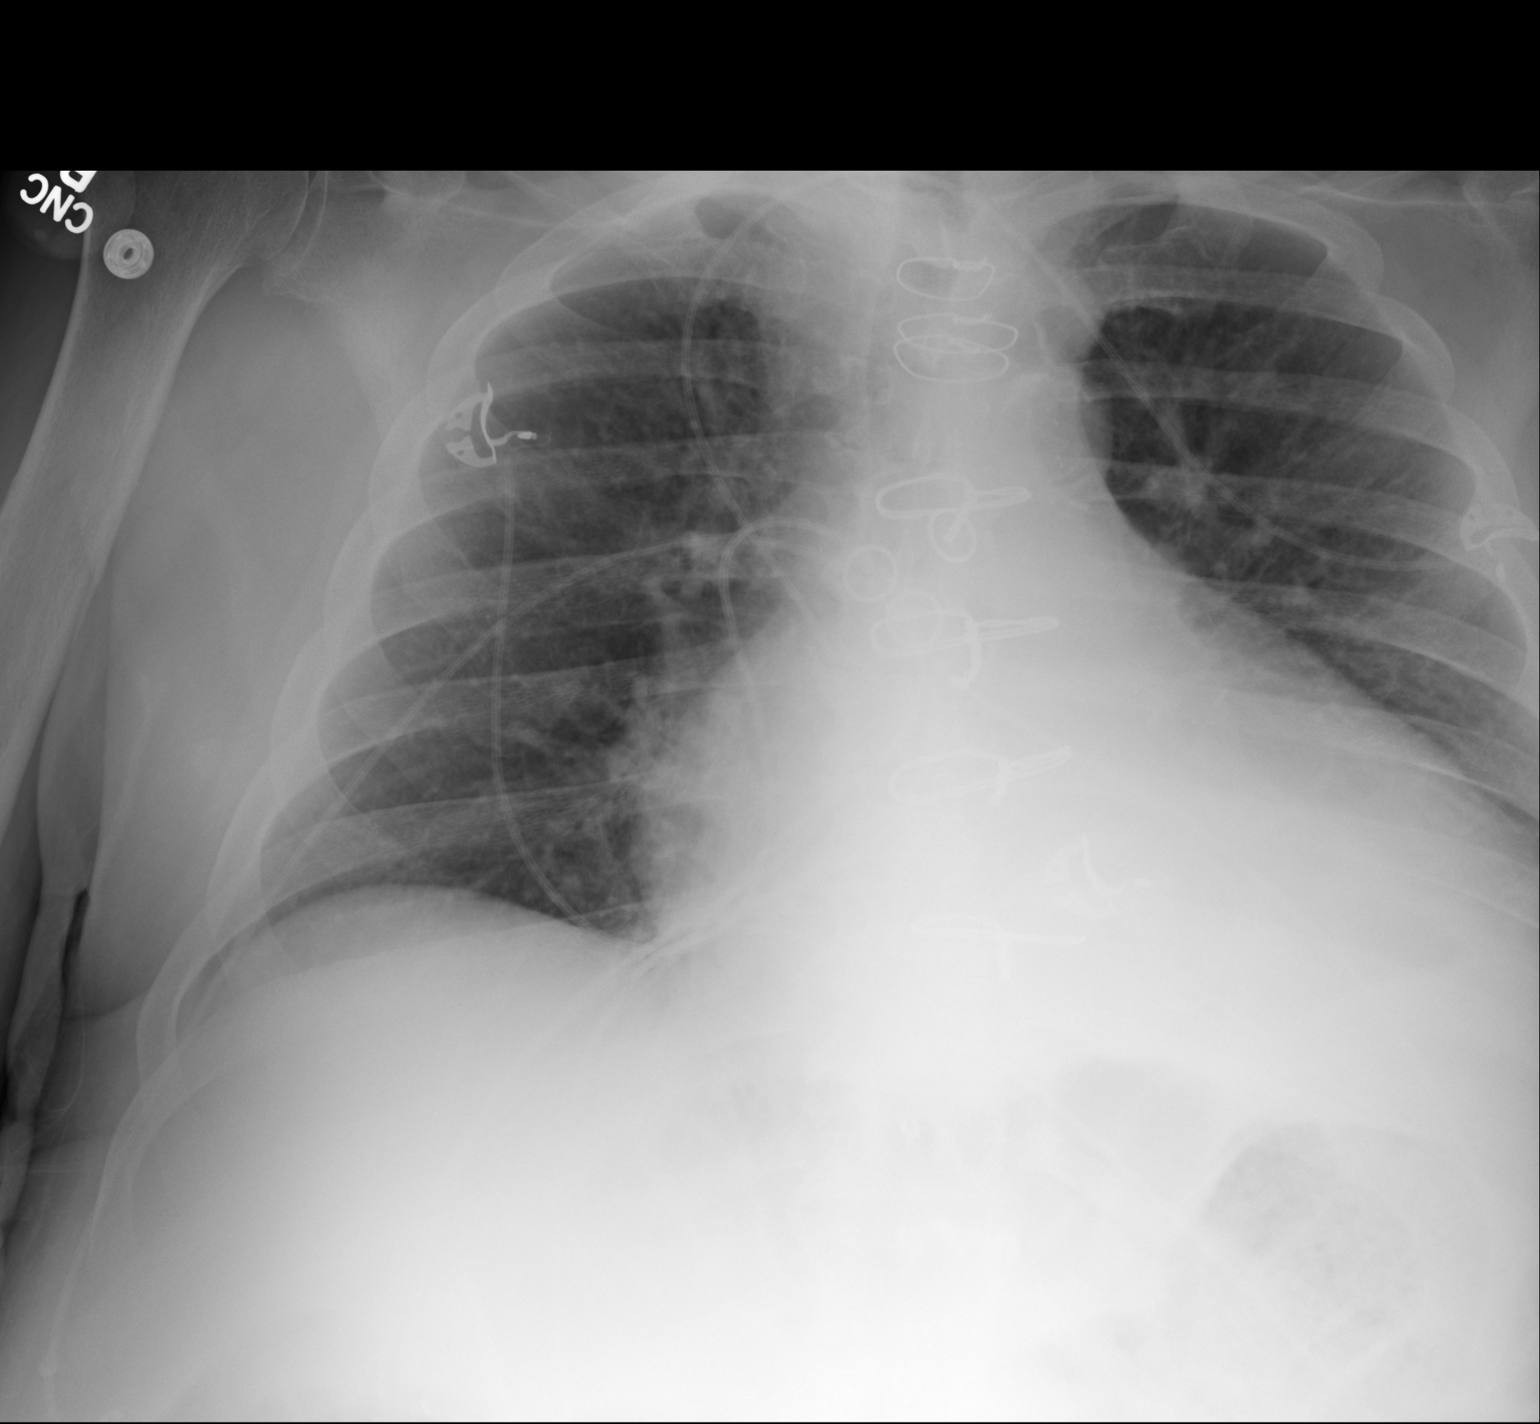

[2 of 2 positions shown; findings below may reference images not displayed]

FINDINGS: Sternotomy wires are intact. Lordotic technique is demonstrated as
the lungs are adequately inflated with minimal linear density of the
left base likely atelectasis and possible small amount of left
pleural fluid. Stable cardiomegaly. Remainder of the exam is
unchanged.
IMPRESSION: Stable left base opacification likely small effusion with
atelectasis.

Mild stable cardiomegaly.

## 2016-10-25 IMAGING — CT CT HEAD W/O CM
1 series · 16 of 30 positions shown, 20 images · non-contrast
Comparison: 03/13/2015

CLINICAL DATA: Confusion.

EXAM:
CT HEAD WITHOUT CONTRAST
TECHNIQUE: Contiguous axial images were obtained from the base of the skull
through the vertex without intravenous contrast.

[Series 2: head wo · axial · 0.44mm/px · z∈[-150,-6]mm · 16 of 36 slices shown, 20 images]
[im 2/36  brain]
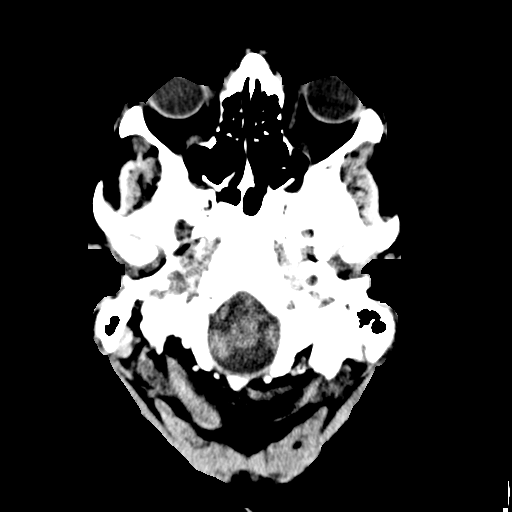
[im 2/36  bone]
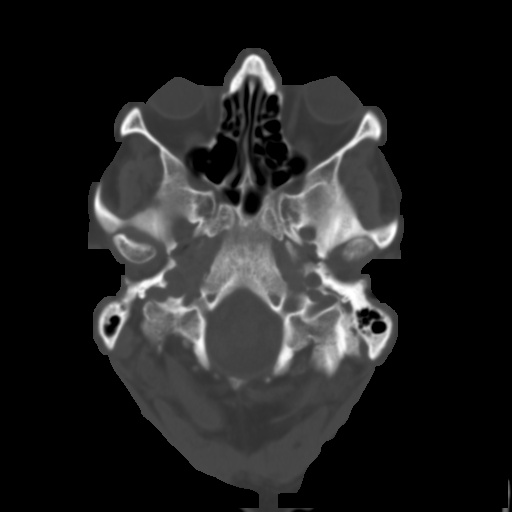
[im 4/36  brain]
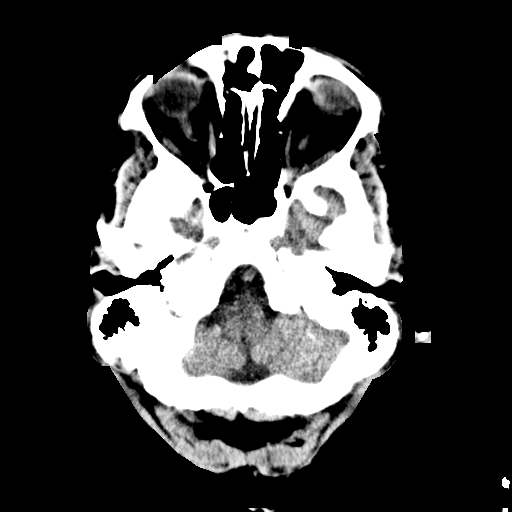
[im 7/36  brain]
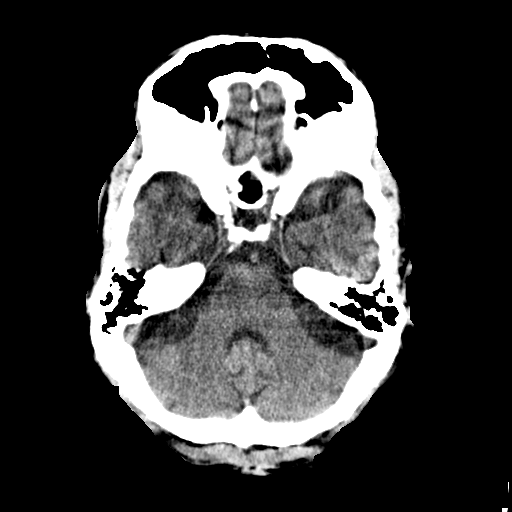
[im 9/36  brain]
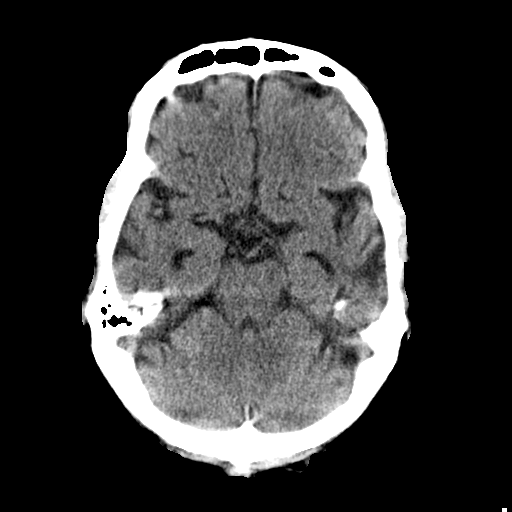
[im 10/36  brain]
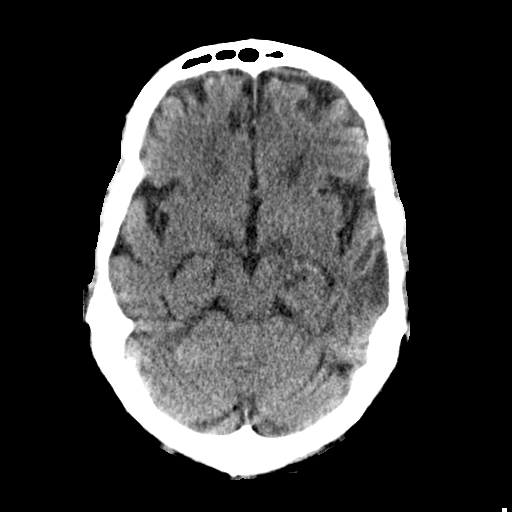
[im 10/36  bone]
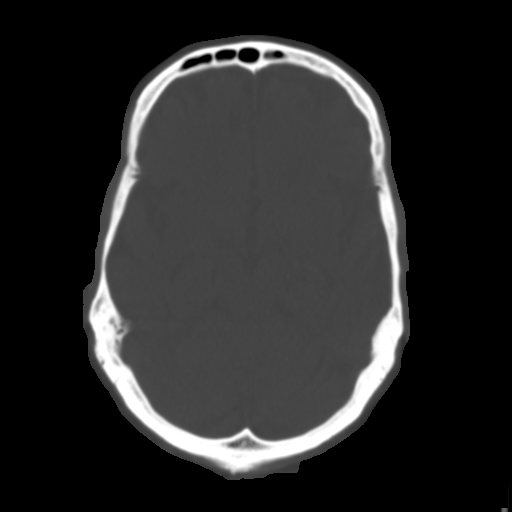
[im 13/36  brain]
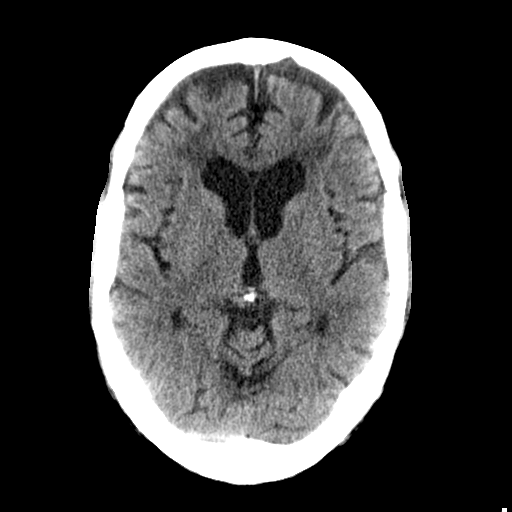
[im 15/36  brain]
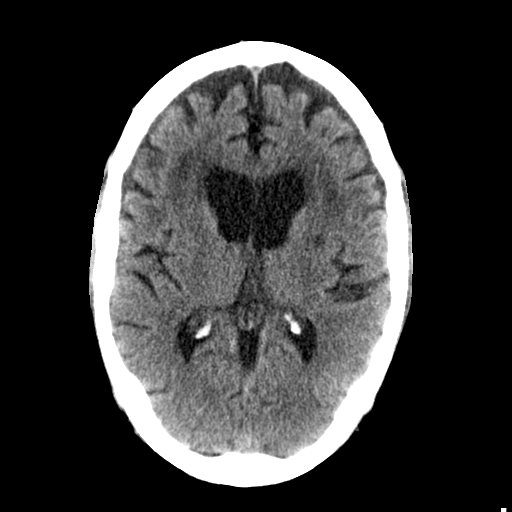
[im 17/36  brain]
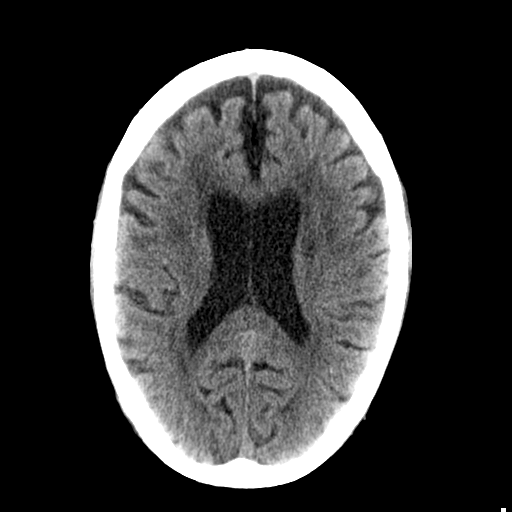
[im 19/36  brain]
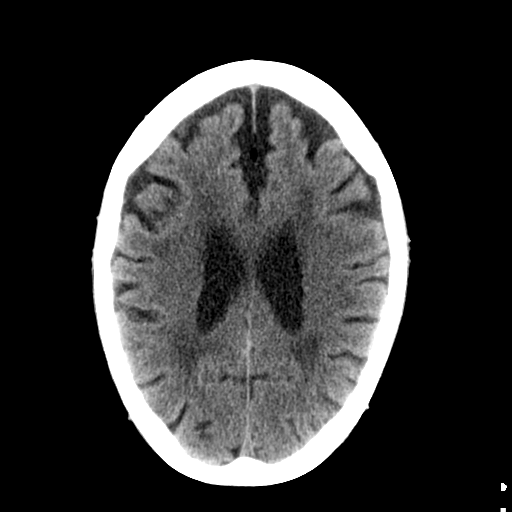
[im 19/36  bone]
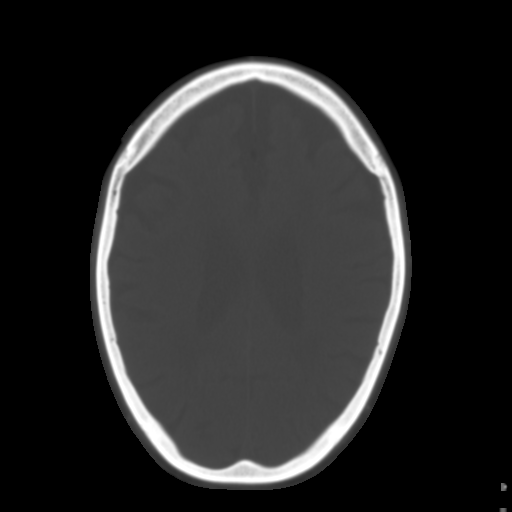
[im 21/36  brain]
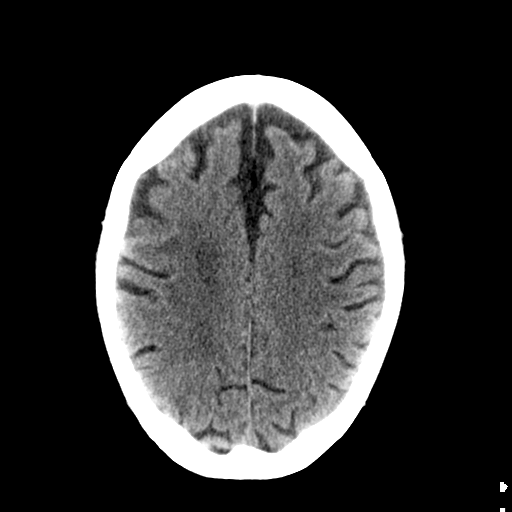
[im 23/36  brain]
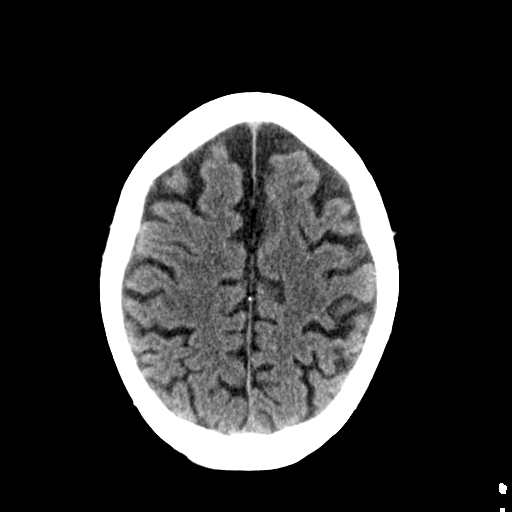
[im 26/36  brain]
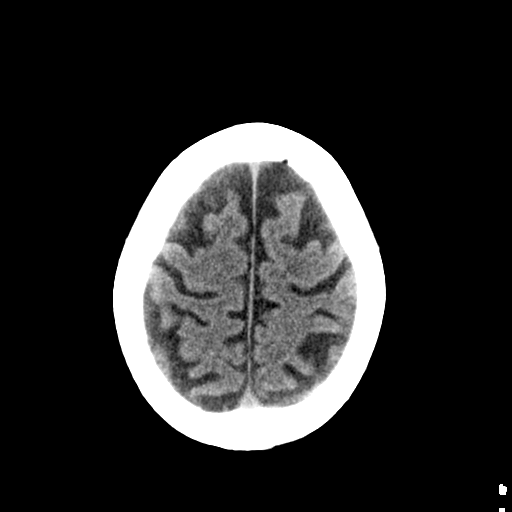
[im 27/36  brain]
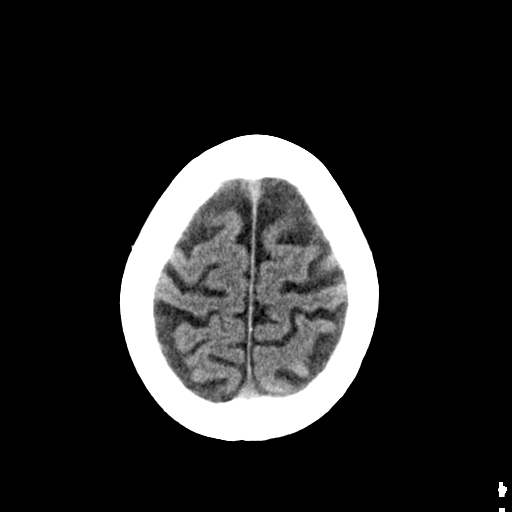
[im 27/36  bone]
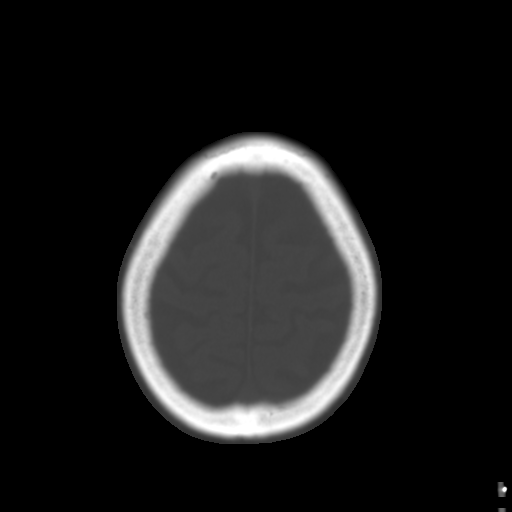
[im 29/36  brain]
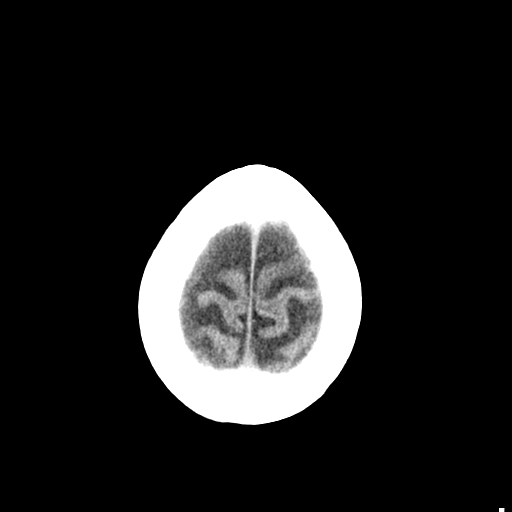
[im 32/36  brain]
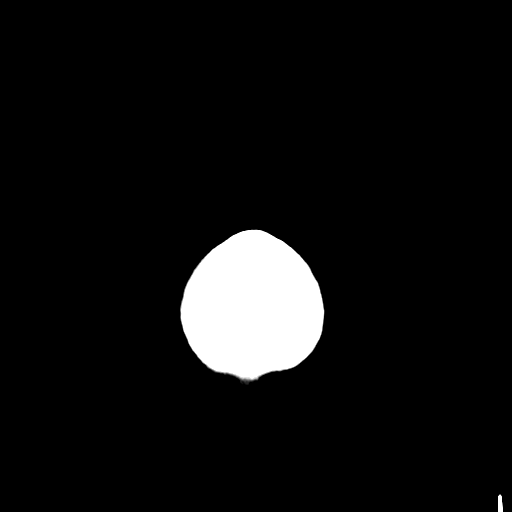
[im 34/36  brain]
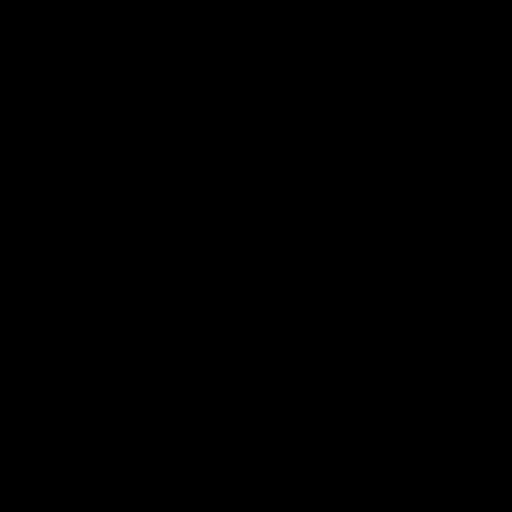

[16 of 30 positions shown; findings below may reference images not displayed]

FINDINGS: Ventricles are normal in configuration. There is ventricular and
sulcal enlargement reflecting mild to moderate atrophy.

There are no parenchymal masses or mass effect. There are patchy
areas of white matter hypoattenuation consistent with moderate
chronic microvascular ischemic change. There is no evidence of a
cortical infarct.

There are no extra-axial masses or abnormal fluid collections. Small
amount of fat along the anterior falx cerebri, stable.

No intracranial hemorrhage.

Visualized sinuses and mastoid air cells are clear.
IMPRESSION: 1. No acute intracranial abnormalities. Stable appearance from the
recent prior exam.
